# Patient Record
Sex: Female | Born: 1960 | Race: White | Hispanic: No | Marital: Married | Smoking: Never smoker
Health system: Southern US, Community
[De-identification: ages and names within clinical notes are randomized; demographics above are authoritative.]

## PROBLEM LIST (undated history)

## (undated) DIAGNOSIS — Z8489 Family history of other specified conditions: Secondary | ICD-10-CM

## (undated) DIAGNOSIS — F32A Depression, unspecified: Secondary | ICD-10-CM

## (undated) DIAGNOSIS — I499 Cardiac arrhythmia, unspecified: Secondary | ICD-10-CM

## (undated) DIAGNOSIS — R0600 Dyspnea, unspecified: Secondary | ICD-10-CM

## (undated) DIAGNOSIS — Z860101 Personal history of adenomatous and serrated colon polyps: Secondary | ICD-10-CM

## (undated) DIAGNOSIS — Z86010 Personal history of colonic polyps: Secondary | ICD-10-CM

## (undated) DIAGNOSIS — I10 Essential (primary) hypertension: Secondary | ICD-10-CM

## (undated) DIAGNOSIS — I493 Ventricular premature depolarization: Secondary | ICD-10-CM

## (undated) DIAGNOSIS — I4891 Unspecified atrial fibrillation: Secondary | ICD-10-CM

## (undated) DIAGNOSIS — N393 Stress incontinence (female) (male): Secondary | ICD-10-CM

## (undated) DIAGNOSIS — G4730 Sleep apnea, unspecified: Secondary | ICD-10-CM

## (undated) DIAGNOSIS — G43909 Migraine, unspecified, not intractable, without status migrainosus: Secondary | ICD-10-CM

## (undated) DIAGNOSIS — D649 Anemia, unspecified: Secondary | ICD-10-CM

## (undated) DIAGNOSIS — F411 Generalized anxiety disorder: Secondary | ICD-10-CM

## (undated) DIAGNOSIS — F329 Major depressive disorder, single episode, unspecified: Secondary | ICD-10-CM

## (undated) DIAGNOSIS — F419 Anxiety disorder, unspecified: Secondary | ICD-10-CM

## (undated) DIAGNOSIS — M5030 Other cervical disc degeneration, unspecified cervical region: Secondary | ICD-10-CM

## (undated) HISTORY — DX: Anxiety disorder, unspecified: F41.9

## (undated) HISTORY — DX: Ventricular premature depolarization: I49.3

## (undated) HISTORY — PX: POLYPECTOMY: SHX149

## (undated) HISTORY — DX: Migraine, unspecified, not intractable, without status migrainosus: G43.909

## (undated) HISTORY — DX: Other cervical disc degeneration, unspecified cervical region: M50.30

## (undated) HISTORY — DX: Depression, unspecified: F32.A

## (undated) HISTORY — DX: Major depressive disorder, single episode, unspecified: F32.9

## (undated) HISTORY — PX: COLONOSCOPY: SHX174

## (undated) HISTORY — DX: Essential (primary) hypertension: I10

## (undated) HISTORY — DX: Unspecified atrial fibrillation: I48.91

---
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
## 1984-07-28 HISTORY — PX: NASAL RECONSTRUCTION: SHX2069

## 1984-07-28 HISTORY — PX: NASAL SEPTUM SURGERY: SHX37

## 1998-07-17 ENCOUNTER — Other Ambulatory Visit: Admission: RE | Admit: 1998-07-17 | Discharge: 1998-07-17 | Payer: Self-pay | Admitting: Obstetrics and Gynecology

## 1999-07-24 ENCOUNTER — Other Ambulatory Visit: Admission: RE | Admit: 1999-07-24 | Discharge: 1999-07-24 | Payer: Self-pay | Admitting: Obstetrics and Gynecology

## 2000-07-24 ENCOUNTER — Other Ambulatory Visit: Admission: RE | Admit: 2000-07-24 | Discharge: 2000-07-24 | Payer: Self-pay | Admitting: Obstetrics and Gynecology

## 2001-07-26 ENCOUNTER — Other Ambulatory Visit: Admission: RE | Admit: 2001-07-26 | Discharge: 2001-07-26 | Payer: Self-pay | Admitting: Obstetrics and Gynecology

## 2002-07-26 ENCOUNTER — Other Ambulatory Visit: Admission: RE | Admit: 2002-07-26 | Discharge: 2002-07-26 | Payer: Self-pay | Admitting: Obstetrics and Gynecology

## 2003-08-03 ENCOUNTER — Other Ambulatory Visit: Admission: RE | Admit: 2003-08-03 | Discharge: 2003-08-03 | Payer: Self-pay | Admitting: Obstetrics and Gynecology

## 2004-08-08 ENCOUNTER — Other Ambulatory Visit: Admission: RE | Admit: 2004-08-08 | Discharge: 2004-08-08 | Payer: Self-pay | Admitting: Obstetrics and Gynecology

## 2005-08-12 ENCOUNTER — Other Ambulatory Visit: Admission: RE | Admit: 2005-08-12 | Discharge: 2005-08-12 | Payer: Self-pay | Admitting: Obstetrics and Gynecology

## 2006-08-17 ENCOUNTER — Other Ambulatory Visit: Admission: RE | Admit: 2006-08-17 | Discharge: 2006-08-17 | Payer: Self-pay | Admitting: Obstetrics and Gynecology

## 2010-04-20 ENCOUNTER — Emergency Department (HOSPITAL_BASED_OUTPATIENT_CLINIC_OR_DEPARTMENT_OTHER)
Admission: EM | Admit: 2010-04-20 | Discharge: 2010-04-20 | Payer: Self-pay | Source: Home / Self Care | Admitting: Emergency Medicine

## 2010-04-20 ENCOUNTER — Ambulatory Visit: Payer: Self-pay | Admitting: Diagnostic Radiology

## 2014-09-25 ENCOUNTER — Ambulatory Visit: Payer: Self-pay | Admitting: Physician Assistant

## 2014-10-12 ENCOUNTER — Encounter: Payer: Self-pay | Admitting: Physician Assistant

## 2014-10-12 ENCOUNTER — Ambulatory Visit (INDEPENDENT_AMBULATORY_CARE_PROVIDER_SITE_OTHER): Payer: 59 | Admitting: Physician Assistant

## 2014-10-12 VITALS — BP 140/88 | HR 64 | Temp 97.7°F | Resp 16 | Ht 67.5 in | Wt 176.0 lb

## 2014-10-12 DIAGNOSIS — I10 Essential (primary) hypertension: Secondary | ICD-10-CM

## 2014-10-12 DIAGNOSIS — M2660 Temporomandibular joint disorder, unspecified: Secondary | ICD-10-CM

## 2014-10-12 DIAGNOSIS — F419 Anxiety disorder, unspecified: Secondary | ICD-10-CM

## 2014-10-12 DIAGNOSIS — R197 Diarrhea, unspecified: Secondary | ICD-10-CM

## 2014-10-12 DIAGNOSIS — R6889 Other general symptoms and signs: Secondary | ICD-10-CM

## 2014-10-12 DIAGNOSIS — R590 Localized enlarged lymph nodes: Secondary | ICD-10-CM

## 2014-10-12 DIAGNOSIS — M542 Cervicalgia: Secondary | ICD-10-CM

## 2014-10-12 DIAGNOSIS — F32A Depression, unspecified: Secondary | ICD-10-CM

## 2014-10-12 DIAGNOSIS — F329 Major depressive disorder, single episode, unspecified: Secondary | ICD-10-CM

## 2014-10-12 DIAGNOSIS — Z0001 Encounter for general adult medical examination with abnormal findings: Secondary | ICD-10-CM

## 2014-10-12 DIAGNOSIS — R002 Palpitations: Secondary | ICD-10-CM

## 2014-10-12 DIAGNOSIS — M26609 Unspecified temporomandibular joint disorder, unspecified side: Secondary | ICD-10-CM

## 2014-10-12 DIAGNOSIS — R0602 Shortness of breath: Secondary | ICD-10-CM

## 2014-10-12 DIAGNOSIS — R5383 Other fatigue: Secondary | ICD-10-CM

## 2014-10-12 LAB — LIPID PANEL
CHOLESTEROL: 189 mg/dL (ref 0–200)
HDL: 55 mg/dL (ref >=46)
LDL CALC: 105 mg/dL — AB (ref 0–99)
TRIGLYCERIDES: 145 mg/dL (ref <150)
Total CHOL/HDL Ratio: 3.4 Ratio
VLDL: 29 mg/dL (ref 0–40)

## 2014-10-12 LAB — BASIC METABOLIC PANEL WITH GFR
BUN: 11 mg/dL (ref 6–23)
CALCIUM: 9.5 mg/dL (ref 8.4–10.5)
CHLORIDE: 102 meq/L (ref 96–112)
CO2: 26 meq/L (ref 19–32)
CREATININE: 0.66 mg/dL (ref 0.50–1.10)
GFR, Est African American: >89 mL/min
Glucose, Bld: 78 mg/dL (ref 70–99)
Potassium: 4.5 mEq/L (ref 3.5–5.3)
SODIUM: 136 meq/L (ref 135–145)

## 2014-10-12 LAB — IRON AND TIBC
%SAT: 19 % — ABNORMAL LOW (ref 20–55)
IRON: 81 ug/dL (ref 42–145)
TIBC: 418 ug/dL (ref 250–470)
UIBC: 337 ug/dL (ref 125–400)

## 2014-10-12 LAB — HEPATIC FUNCTION PANEL
ALBUMIN: 4.2 g/dL (ref 3.5–5.2)
ALK PHOS: 69 U/L (ref 39–117)
ALT: 21 U/L (ref 0–35)
AST: 20 U/L (ref 0–37)
Bilirubin, Direct: 0.1 mg/dL (ref 0.0–0.3)
Indirect Bilirubin: 0.4 mg/dL (ref 0.2–1.2)
Total Bilirubin: 0.5 mg/dL (ref 0.2–1.2)
Total Protein: 7.2 g/dL (ref 6.0–8.3)

## 2014-10-12 LAB — MAGNESIUM: MAGNESIUM: 1.9 mg/dL (ref 1.5–2.5)

## 2014-10-12 MED ORDER — VERAPAMIL HCL ER 120 MG PO TBCR: 120.0000 mg | EXTENDED_RELEASE_TABLET | Freq: Every day | ORAL | Status: DC

## 2014-10-12 NOTE — Patient Instructions (Addendum)
Benefiber is good for constipation/diarrhea/irritable bowel syndrome, it helps with weight loss and can help lower your bad cholesterol. Please do 1-2 TBSP in the morning in water, coffee, or tea. It can take up to a month before you can see a difference with your bowel movements. It is cheapest from [employer], [employer], [employer].   Please start on the nexium samples once daily for 10 days 30 mins before food.  Silent reflux/GERD can cause a cough/shortness of breath/hoarsness/palpitations  WITHOUT heart burn because the esophagus that goes to the stomach and trachea that goes to the lungs are very close and when you lay down the acid can irritate your throat and lungs. This can cause hoarseness, cough, and wheezing. Please stop any alcohol or anti-inflammatories like aleve/advil/ibuprofen and start nexium samples 30 mins before food for 2 weeks, then switch to over the counter zantac/ratinidine or pepcid/famotadine once at night for 2 weeks.   Can start the verapamil in 1 week at night  Get a chest xray and neck xray at [facility]  We are going to get an [place] of your neck to look at your lymph nodes and neck.   What is the TMJ? The temporomandibular (tem-PUH-ro-man-DIB-yoo-ler) joint, or the TMJ, connects the upper and lower jawbones. This joint allows the jaw to open wide and move back and forth when you chew, talk, or yawn.There are also several muscles that help this joint move. There can be muscle tightness and pain in the muscle that can cause several symptoms.  What causes TMJ pain? There are many causes of TMJ pain. Repeated chewing (for example, chewing gum) and clenching your teeth can cause pain in the joint. Some TMJ pain has no obvious cause. What can I do to ease the pain? There are many things you can do to help your pain get better. When you have pain:  Eat soft foods and stay away from chewy foods (for example, taffy) Try to use both sides of your mouth to chew Don't chew  gum Massage Don't open your mouth wide (for example, during yawning or singing) Don't bite your cheeks or fingernails Lower your amount of stress and worry Applying a warm, damp washcloth to the joint may help. Over-the-counter pain medicines such as ibuprofen (one brand: Advil) or acetaminophen (one brand: Tylenol) might also help. Do not use these medicines if you are allergic to them or if your doctor told you not to use them. How can I stop the pain from coming back? When your pain is better, you can do these exercises to make your muscles stronger and to keep the pain from coming back:  Resisted mouth opening: Place your thumb or two fingers under your chin and open your mouth slowly, pushing up lightly on your chin with your thumb. Hold for three to six seconds. Close your mouth slowly. Resisted mouth closing: Place your thumbs under your chin and your two index fingers on the ridge between your mouth and the bottom of your chin. Push down lightly on your chin as you close your mouth. Tongue up: Slowly open and close your mouth while keeping the tongue touching the roof of the mouth. Side-to-side jaw movement: Place an object about one fourth of an inch thick (for example, two tongue depressors) between your front teeth. Slowly move your jaw from side to side. Increase the thickness of the object as the exercise becomes easier Forward jaw movement: Place an object about one fourth of an inch thick between your  front teeth and move the bottom jaw forward so that the bottom teeth are in front of the top teeth. Increase the thickness of the object as the exercise becomes easier. These exercises should not be painful. If it hurts to do these exercises, stop doing them and talk to your family doctor.   Hiatal Hernia A hiatal hernia occurs when part of your stomach slides above the muscle that separates your abdomen from your chest (diaphragm). You can be born with a hiatal hernia (congenital), or  it may develop over time. In almost all cases of hiatal hernia, only the top part of the stomach pushes through.  Many people have a hiatal hernia with no symptoms. The larger the hernia, the more likely that you will have symptoms. In some cases, a hiatal hernia allows stomach acid to flow back into the tube that carries food from your mouth to your stomach (esophagus). This may cause heartburn symptoms. Severe heartburn symptoms may mean you have developed a condition called gastroesophageal reflux disease (GERD).  CAUSES  Hiatal hernias are caused by a weakness in the opening (hiatus) where your esophagus passes through your diaphragm to attach to the upper part of your stomach. You may be born with a weakness in your hiatus, or a weakness can develop. RISK FACTORS Older age is a major risk factor for a hiatal hernia. Anything that increases pressure on your diaphragm can also increase your risk of a hiatal hernia. This includes:  Pregnancy.  Excess weight.  Frequent constipation. SIGNS AND SYMPTOMS  People with a hiatal hernia often have no symptoms. If symptoms develop, they are almost always caused by GERD. They may include:  Heartburn.  Belching.  Indigestion.  Trouble swallowing.  Coughing or wheezing.  Shortness of breath  Palpitations  Sore throat.  Hoarseness.  Chest pain. DIAGNOSIS  A hiatal hernia is sometimes found during an exam for another problem. Your health care provider may suspect a hiatal hernia if you have symptoms of GERD. Tests may be done to diagnose GERD. These may include:  X-rays of your stomach or chest.  An upper gastrointestinal (GI) series. This is an X-ray exam of your GI tract involving the use of a chalky liquid that you swallow. The liquid shows up clearly on the X-ray.  Endoscopy. This is a procedure to look into your stomach using a thin, flexible tube that has a tiny camera and light on the end of it. TREATMENT  If you have no  symptoms, you may not need treatment. If you have symptoms, treatment may include:  Dietary and lifestyle changes to help reduce GERD symptoms.  Medicines. These may include:  Over-the-counter antacids.  Medicines that make your stomach empty more quickly.  Medicines that block the production of stomach acid (H2 blockers).  Stronger medicines to reduce stomach acid (proton pump inhibitors).  You may need surgery to repair the hernia if other treatments are not helping. HOME CARE INSTRUCTIONS   Take all medicines as directed by your health care provider.  Quit smoking, if you smoke.  Try to achieve and maintain a healthy body weight.  Eat frequent small meals instead of three large meals a day. This keeps your stomach from getting too full.  Eat slowly.  Do not lie down right after eating.  Do noteat 1-2 hours before bed.   Do not drink beverages with caffeine. These include cola, coffee, cocoa, and tea.  Do not drink alcohol.  Avoid foods that can make symptoms  of GERD worse. These may include:  Fatty foods.  Citrus fruits.  Other foods and drinks that contain acid.  Avoid putting pressure on your belly. Anything that puts pressure on your belly increases the amount of acid that may be pushed up into your esophagus.   Avoid bending over, especially after eating.  Raise the head of your bed by putting blocks under the legs. This keeps your head and esophagus higher than your stomach.  Do not wear tight clothing around your chest or stomach.  Try not to strain when having a bowel movement, when urinating, or when lifting heavy objects. SEEK MEDICAL CARE IF:  Your symptoms are not controlled with medicines or lifestyle changes.  You are having trouble swallowing.  You have coughing or wheezing that will not go away. SEEK IMMEDIATE MEDICAL CARE IF:  Your pain is getting worse.  Your pain spreads to your arms, neck, jaw, teeth, or back.  You have  shortness of breath.  You sweat for no reason.  You feel sick to your stomach (nauseous) or vomit.  You vomit blood.  You have bright red blood in your stools.  You have black, tarry stools.  Document Released: 10/04/2003 Document Revised: 11/28/2013 Document Reviewed: 07/01/2013 [facility] Patient Information 2015 [place], [place]. This information is not intended to replace advice given to you by your health care provider. Make sure you discuss any questions you have with your health care provider.

## 2014-10-12 NOTE — Progress Notes (Signed)
Complete Physical  Assessment and Plan: TMJ-information given to the patient, no gum/decrease hard foods, warm wet wash clothes, decrease stress, talk with dentist about possible night guard, can do massage, and exercise.  Neck non tender lymphadenopathy with thyroid U/S- get CBC, labs, US neck Neck pain- get Xray, can refer to PT, exercises, RICE.  Diarrhea- benefiber, diet, will set up for colonoscopy.  Palpitations- check labs, r/u anemia, TSH, dehydration, electrolytes imbalance Check EKG which is normal, PPI for 2 weeks, refer to cardiology Increase water, decrease alcohol/caffeine, valsalva maneuvers taught Follow up in 4 weeks, if not better can take verapamil 120 QHS SOB nonexertional, atypical- ? GERD- get on nexium, get CXR, get labs.  Hypertension- monitor at home, check labs, can get on verapmail 120mg  QHS Depression/anxiety- cont meds.   OVER 40 minutes of exam, counseling, chart review, referral performed Follow up 1 month.  Discussed med's effects and SE's. Screening labs and tests as requested with regular follow-up as recommended.  HPI  54 y.o. female  presents to establish as a new patient, her parents and husband are patients here.   Her blood pressure has been controlled at home, today their BP is BP: 140/88 mmHg She does workout. She denies chest pain, shortness of breath, dizziness.  Has mainly been seeing Dr. Philis Pique ( OB/GYN) and UC.  She states she has had her heart fluttering/skipping a beat, worse with caffeine or with fatigue, will get it once a week, last for seconds, no accompaniments, denies SOB, dizziness, CP. Will workout at the gym on the treadmill, would occ have fast heart beats with exertion but no symptoms with it, coughing would help. Does have sister with MVP.  She states that she has always had trouble getting a deep breath, she will have to yawn to catch her breath, when she was exercising regularly she felt better, denies cough, wheezing, CP, non  smoking.  She has depression/anxiety and is on zoloft which she states helps.  She has neck pain, starts C7, up to her shoulders are very tight, she will hear popping and clicking, and then last week felt a catch while looking up. Denies numbness, tingling, weakness in her arms.    Current Medications:       norethindrone-ethinyl estradiol 1-20 MG-MCG tablet  Commonly known as:  JUNEL FE,GILDESS FE,LOESTRIN FE  Take 1 tablet by mouth daily.     sertraline 50 MG tablet  Commonly known as:  ZOLOFT  Take 50 mg by mouth daily.       Health Maintenance:   Tetanus: 2011 Pneumovax: Flu vaccine: Zostavax: LMP: 2 weeks ago Pap: yearly with Dr. Philis Pique MGM: yearly   DEXA: N/A Colonoscopy: is due EGD: N/A Last Dental Exam: Dr. Trenton Gammon Last Eye Exam: recent exam, wears glasses, [facility]  Allergies: Allergies no known allergies Medical History:  Past Medical History  Diagnosis Date  . Hypertension   . Depression   . Anxiety   . Migraine     history, has not had in years   Surgical History:  Past Surgical History  Procedure Laterality Date  . Nasal reconstruction  1986    Septal repair   Family History:  Family History  Problem Relation Age of Onset  . Cancer Maternal Grandmother     cervical cancer  . Arthritis Mother     bilateral knees  . Arthritis Maternal Aunt     hands  . Osteoporosis Maternal Grandmother   . Osteoporosis Paternal Grandmother   .  Heart attack Maternal Grandmother     age 69  . Heart failure Maternal Grandmother     age 22   Social History:  History  Substance Use Topics  . Smoking status: Never Smoker   . Smokeless tobacco: Never Used  . Alcohol Use: 0.0 oz/week    0 Standard drinks or equivalent per week     Comment: wine every night    Review of Systems: Review of Systems  Constitutional: Positive for malaise/fatigue. Negative for fever, chills, weight loss and diaphoresis.  HENT: Negative for congestion, ear discharge,  ear pain, hearing loss, nosebleeds, sore throat and tinnitus.   Eyes: Negative.   Respiratory: Positive for shortness of breath. Negative for cough, hemoptysis, sputum production, wheezing and stridor.   Cardiovascular: Positive for palpitations. Negative for chest pain, orthopnea, claudication, leg swelling and PND.  Gastrointestinal: Negative.   Genitourinary: Negative.   Musculoskeletal: Positive for neck pain. Negative for myalgias, back pain, joint pain and falls.  Skin: Negative.   Neurological: Positive for headaches. Negative for dizziness, tingling, tremors, sensory change, speech change, focal weakness, seizures, loss of consciousness and weakness.  Psychiatric/Behavioral: Positive for depression. Negative for suicidal ideas, hallucinations, memory loss and substance abuse. The patient is nervous/anxious. The patient does not have insomnia.     Physical Exam: Estimated body mass index is 27.14 kg/(m^2) as calculated from the following:   Height as of this encounter: 5' 7.5" (1.715 m).   Weight as of this encounter: 176 lb (79.833 kg). BP 140/88 mmHg  Pulse 64  Temp(Src) 97.7 F (36.5 C)  Resp 16  Ht 5' 7.5" (1.715 m)  Wt 176 lb (79.833 kg)  BMI 27.14 kg/m2  LMP 09/22/2014 (Approximate) General Appearance: Well nourished, in no apparent distress.  Eyes: PERRLA, EOMs, conjunctiva no swelling or erythema, normal fundi and vessels.  Sinuses: No Frontal/maxillary tenderness  ENT/Mouth: Ext aud canals clear, normal light reflex with TMs without erythema, bulging. Good dentition. No erythema, swelling, or exudate on post pharynx. Tonsils not swollen or erythematous. Hearing normal. + TMJ bilateral Neck: Supple, thyroid nodules. No bruits  Respiratory: Respiratory effort normal, BS equal bilaterally without rales, rhonchi, wheezing or stridor.  Cardio: RRR without murmurs, rubs or gallops. Brisk peripheral pulses without edema.  Chest: symmetric, with normal excursions and  percussion.  Breasts: defer  Abdomen: Soft, + epigastric tenderness, no guarding, rebound, hernias, masses, or organomegaly. .  Lymphatics: Non tender bilateral submandibular lymphadenopathy.  Genitourinary: defer Musculoskeletal: Full ROM all peripheral extremities,5/5 strength, and normal gait. normal range of motion and supple, without spinous process tenderness, with paraspinal muscle tenderness the right side, normal sensation, reflexes, and pulses distal. Skin: Warm, dry without rashes, lesions, ecchymosis. Neuro: Cranial nerves intact, reflexes equal bilaterally. Normal muscle tone, no cerebellar symptoms. Sensation intact.  Psych: Awake and oriented X 3, normal affect, Insight and Judgment appropriate.   EKG: WNL no changes.   Vicie Mutters 4:09 PM [facility]

## 2014-10-13 ENCOUNTER — Ambulatory Visit (HOSPITAL_COMMUNITY)
Admission: RE | Admit: 2014-10-13 | Discharge: 2014-10-13 | Disposition: A | Discharge status: Home / Self Care | Payer: 59 | Source: Ambulatory Visit | Attending: Physician Assistant | Admitting: Physician Assistant

## 2014-10-13 DIAGNOSIS — M25511 Pain in right shoulder: Secondary | ICD-10-CM | POA: Insufficient documentation

## 2014-10-13 DIAGNOSIS — R0602 Shortness of breath: Secondary | ICD-10-CM | POA: Diagnosis not present

## 2014-10-13 DIAGNOSIS — M5032 Other cervical disc degeneration, mid-cervical region: Secondary | ICD-10-CM | POA: Insufficient documentation

## 2014-10-13 DIAGNOSIS — M542 Cervicalgia: Secondary | ICD-10-CM | POA: Diagnosis not present

## 2014-10-13 DIAGNOSIS — G8929 Other chronic pain: Secondary | ICD-10-CM | POA: Diagnosis not present

## 2014-10-13 DIAGNOSIS — M25512 Pain in left shoulder: Secondary | ICD-10-CM | POA: Diagnosis not present

## 2014-10-13 LAB — CBC WITH DIFFERENTIAL/PLATELET
BASOS PCT: 0 % (ref 0–1)
Basophils Absolute: 0 10*3/uL (ref 0.0–0.1)
Eosinophils Absolute: 0.2 10*3/uL (ref 0.0–0.7)
Eosinophils Relative: 2 % (ref 0–5)
HEMATOCRIT: 38.9 % (ref 36.0–46.0)
Hemoglobin: 12.9 g/dL (ref 12.0–15.0)
Lymphocytes Relative: 26 % (ref 12–46)
Lymphs Abs: 2.3 10*3/uL (ref 0.7–4.0)
MCH: 29.4 pg (ref 26.0–34.0)
MCHC: 33.2 g/dL (ref 30.0–36.0)
MCV: 88.6 fL (ref 78.0–100.0)
MONO ABS: 0.8 10*3/uL (ref 0.1–1.0)
MONOS PCT: 9 % (ref 3–12)
MPV: 8.9 fL (ref 8.6–12.4)
Neutro Abs: 5.6 10*3/uL (ref 1.7–7.7)
Neutrophils Relative %: 63 % (ref 43–77)
PLATELETS: 311 10*3/uL (ref 150–400)
RBC: 4.39 MIL/uL (ref 3.87–5.11)
RDW: 12.8 % (ref 11.5–15.5)
WBC: 8.9 10*3/uL (ref 4.0–10.5)

## 2014-10-13 LAB — VITAMIN B12: VITAMIN B 12: 412 pg/mL (ref 211–911)

## 2014-10-13 LAB — URINALYSIS, MICROSCOPIC ONLY
Casts: NONE SEEN
Crystals: NONE SEEN
SQUAMOUS EPITHELIAL / LPF: NONE SEEN

## 2014-10-13 LAB — MICROALBUMIN / CREATININE URINE RATIO: Creatinine, Urine: 36.4 mg/dL

## 2014-10-13 LAB — VITAMIN D 25 HYDROXY (VIT D DEFICIENCY, FRACTURES): VIT D 25 HYDROXY: 74 ng/mL (ref 30–100)

## 2014-10-13 LAB — TSH: TSH: 1.908 u[IU]/mL (ref 0.350–4.500)

## 2014-10-13 LAB — FERRITIN: FERRITIN: 54 ng/mL (ref 10–291)

## 2014-10-13 LAB — HEMOGLOBIN A1C
HEMOGLOBIN A1C: 5.5 % (ref <5.7)
MEAN PLASMA GLUCOSE: 111 mg/dL (ref <117)

## 2014-10-17 ENCOUNTER — Other Ambulatory Visit: Payer: Self-pay

## 2014-10-17 ENCOUNTER — Encounter: Payer: Self-pay | Admitting: Internal Medicine

## 2014-10-17 DIAGNOSIS — R002 Palpitations: Secondary | ICD-10-CM

## 2014-10-19 ENCOUNTER — Ambulatory Visit (HOSPITAL_COMMUNITY)
Admission: RE | Admit: 2014-10-19 | Discharge: 2014-10-19 | Disposition: A | Discharge status: Home / Self Care | Payer: 59 | Source: Ambulatory Visit | Attending: Physician Assistant | Admitting: Physician Assistant

## 2014-10-19 DIAGNOSIS — E041 Nontoxic single thyroid nodule: Secondary | ICD-10-CM | POA: Diagnosis not present

## 2014-10-19 DIAGNOSIS — R590 Localized enlarged lymph nodes: Secondary | ICD-10-CM

## 2014-10-23 ENCOUNTER — Other Ambulatory Visit: Payer: Self-pay | Admitting: Physician Assistant

## 2014-10-23 DIAGNOSIS — R590 Localized enlarged lymph nodes: Secondary | ICD-10-CM

## 2014-10-24 ENCOUNTER — Encounter: Payer: Self-pay | Admitting: Internal Medicine

## 2014-10-26 ENCOUNTER — Ambulatory Visit
Admission: RE | Admit: 2014-10-26 | Discharge: 2014-10-26 | Disposition: A | Discharge status: Home / Self Care | Payer: 59 | Source: Ambulatory Visit | Attending: Physician Assistant | Admitting: Physician Assistant

## 2014-10-26 DIAGNOSIS — R590 Localized enlarged lymph nodes: Secondary | ICD-10-CM

## 2014-10-26 MED ORDER — IOPAMIDOL (ISOVUE-300) INJECTION 61%
75.0000 mL | Freq: Once | INTRAVENOUS | Status: AC | PRN
  Administered 2014-10-26: 75 mL via INTRAVENOUS

## 2014-11-02 ENCOUNTER — Encounter: Payer: Self-pay | Admitting: Cardiology

## 2014-12-07 ENCOUNTER — Encounter: Payer: Self-pay | Admitting: Cardiology

## 2014-12-07 NOTE — Progress Notes (Signed)
Patient ID: KEITH CANCIO, female   DOB: [birth year], 54 y.o.   MRN: [id number]   Ernst Spell    Date of visit:  12/07/2014 DOB:  1960-12-03    Age:  54 yrs. Medical record number:  [id number]     Account number:  [id number] Primary Care Provider: Unk Pinto D ____________________________ CURRENT DIAGNOSES  1. Palpitations  2. Essential hypertension  3. Overweight  4. Ventricular premature depolarization ____________________________ ALLERGIES  No Known Allergies ____________________________ MEDICATIONS  1. sertraline 50 mg tablet, 1 p.o. daily  2. Junel 1/20 (21) 1 mg-20 mcg tablet, 1 p.o. daily  3. verapamil ER 120 mg 24 hr capsule,extended release, QHS  4. Zantac 75 mg tablet, QHS ____________________________ CHIEF COMPLAINTS  Followup of Palpitations ____________________________ HISTORY OF PRESENT ILLNESS Patient returns for cardiac followup. She has been wearing an event monitor but is said to get a replacement for her because she forgot to discharge it. She denies angina but continues to have occasional palpitations. Her event monitor has shown some transmissions that showed no arrhythmias during complaints of skipped heartbeats and others that showed PVCs. The echocardiogram showed concentric LVH with normal LV systolic function. 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 ____________________________ PAST HISTORY  Past Medical Illnesses:  hypertension, anxiety, migraine headaches;  Cardiovascular Illnesses:  arrhythmia-PVCs;  Surgical Procedures:  nasal reconstruction;  NYHA Classification:  I;  Canadian Angina Classification:  Class 0: Asymptomatic;  Cardiology Procedures-Invasive:  no history of prior cardiac procedures;  Cardiology Procedures-Noninvasive:  event monitor April 2016;  LVEF of 60% documented via echocardiogram on 11/24/2014,   ____________________________ CARDIO-PULMONARY TEST DATES EKG Date:  11/02/2014;  Holter/Event  Monitor Date: 11/03/2014;  Chest Xray Date: 10/13/2014;   ____________________________ FAMILY HISTORY Brother -- Brother alive with problem, Hypertension Brother -- Brother alive and well Brother -- Brother alive and well Father -- Father alive with problem, Hypercholesterolemia Mother -- Mother alive with problem, Hypercholesterolemia Sister -- Sister alive and well Sister -- Sister alive with problem Sister -- Sister alive and well ____________________________ SOCIAL HISTORY Alcohol Use:  wine 1 per day;  Smoking:  never smoked;  Diet:  regular diet;  Lifestyle:  married and 1 son;  Exercise:  treadmill for approximately 45 minutes 4 days per week and weight lifting for approximately 45 minutes 4 days per week;  Occupation:  Optometrist;  Residence:  lives with husband;   ____________________________ REVIEW OF SYSTEMS General:  denies recent weight change, fatique or change in exercise tolerance. Respiratory: dyspnea with exertion Cardiovascular:  please review HPI Abdominal: diarrhea Musculoskeletal:  cervical spondylosis Neurological:  occasional migraine headaches Psychiatric:  anxiety, depression  ____________________________ PHYSICAL EXAMINATION VITAL SIGNS  Blood Pressure:  135/80 Sitting, Right arm, regular cuff   Pulse:  72/min. Weight:  176.00 lbs. Height:  67.5"BMI: 27  Constitutional:  pleasant white female, in no acute distress, mildly obese Skin:  warm and dry to touch, no apparent skin lesions, or masses noted. Head:  normocephalic, normal hair pattern, no masses or tenderness Chest:  normal symmetry, clear to auscultation. Cardiac:  regular rhythm, normal S1 and S2, No S3 or S4, no murmurs, gallops or rubs detected. Peripheral Pulses:  the femoral,dorsalis pedis, and posterior tibial pulses are full and equal bilaterally with no bruits auscultated. Extremities & Back:  no deformities, clubbing, cyanosis, erythema or edema observed. Normal muscle strength and  tone. Neurological:  no gross motor or sensory deficits noted, affect appropriate, oriented x3. ____________________________ MOST RECENT LIPID  PANEL 10/12/14  CHOL TOTL 189 mg/dl, LDL 105 NM, HDL 55 mg/dl, TRIGLYCER 145 mg/dl, ALT 21 u/l, ALK PHOS 69 u/l, CHOL/HDL 3.4 (Calc) and AST 20 u/l ____________________________ IMPRESSIONS/PLAN  1. Episodic symptomatic palpitations that correlate infrequently with PVCs on monitoring no episodes of SVT are noted 2. Hypertensive heart disease with LVH 3. Overweight  Recommendations:  Recommended that she continue to lose weight and had good blood pressure control. The monitor has really shown only rare PVCs and also some complaints of palpitations that did not correlate with PVCs. At this point I don't think she needs additional workup. She needs to lose weight and have good blood pressure control because of the LVH. Recommended to continue exercise. Followup as needed. ____________________________ TODAYS ORDERS  1. Return: prn                       ____________________________ Cardiology Physician:  Kerry Hough MD [facility]

## 2014-12-07 NOTE — Progress Notes (Signed)
Patient ID: Samantha Terrell, female   DOB: 1961-02-18, 54 y.o.   MRN: [id number]  Ernst Spell    Date of visit:  11/02/2014 DOB:  04-Mar-1961    Age:  54 yrs. Medical record number:  [id number]     Account number:  [id number] Primary Care Provider: Unk Pinto D ____________________________ CURRENT DIAGNOSES  1. Palpitations  2. Dyspnea  3. Essential hypertension  4. Overweight ____________________________ ALLERGIES  No Known Allergies ____________________________ MEDICATIONS  1. sertraline 50 mg tablet, 1 p.o. daily  2. Junel 1/20 (21) 1 mg-20 mcg tablet, 1 p.o. daily  3. verapamil ER 120 mg 24 hr capsule,extended release, QHS  4. Zantac 75 mg tablet, QHS ____________________________ CHIEF COMPLAINTS  Dyspnea with exertion  Palpitations ____________________________ HISTORY OF PRESENT ILLNESS This very nice 54 year old female is seen for evaluation of palpitations and mild dyspnea. The patient was recently seen for a physical and has a several year history of dyspnea as well as palpitations. She describes the palpitations as a skip in her heart that happens intermittently for the past 6 years. She also describes episodes of rapid heartbeat they believe her feel as if she is short of breath. She has also noted some dyspnea although she is able to do treadmill exercise for 40 minutes without limiting dyspnea. She does note dyspnea when carrying boxes up stairs or with other activity. The rapid heartbeat happens a few times per week. She was recently seen for a physical and noted to be somewhat hypertensive and was started on the rapid note. She has continued to have palpitations since been placed on verapamil. She has no chest pain suggestive of angina and denies PND, orthopnea or significant edema. She has slowly gained some weight over the past few years and is in the overweight category. She has no history of congenital heart disease. There is no family history of premature cardiac  disease. On her recent evaluation she had a normal chest x-ray and because of some possible adenopathy eventually had a CT scan of her neck that showed no carotid artery disease or calcification and normal adenopathy. TSH was normal previously. She uses only an occasional amount of caffeine. She does tend to be an anxious person and has been placed on sertraline for anxiety and some possible depression in the past. ____________________________ PAST HISTORY  Past Medical Illnesses:  hypertension, anxiety, migraine headaches;  Cardiovascular Illnesses:  no previous history of cardiac disease.;  Surgical Procedures:  nasal reconstruction;  NYHA Classification:  I;  Canadian Angina Classification:  Class 0: Asymptomatic;  Cardiology Procedures-Invasive:  no history of prior cardiac procedures;  Cardiology Procedures-Noninvasive:  no previous non-invasive procedures;  LVEF not documented,   ____________________________ CARDIO-PULMONARY TEST DATES EKG Date:  11/02/2014;  Chest Xray Date: 10/13/2014;   ____________________________ FAMILY HISTORY Brother -- Brother alive with problem, Hypertension Brother -- Brother alive and well Brother -- Brother alive and well Father -- Father alive with problem, Hypercholesterolemia Mother -- Mother alive with problem, Hypercholesterolemia Sister -- Sister alive and well Sister -- Sister alive with problem Sister -- Sister alive and well ____________________________ SOCIAL HISTORY Alcohol Use:  wine 1 per day;  Smoking:  never smoked;  Diet:  regular diet;  Lifestyle:  married and 1 son;  Exercise:  treadmill for approximately 45 minutes 4 days per week and weight lifting for approximately 45 minutes 4 days per week;  Occupation:  Optometrist;  Residence:  lives with husband;   ____________________________ REVIEW OF SYSTEMS General:  denies recent  weight change, fatique or change in exercise tolerance.  Integumentary:dry skin Eyes: denies diplopia, history of  glaucoma or visual problems. Ears, Nose, Throat, Mouth:  denies any hearing loss, epistaxis, hoarseness or difficulty speaking. Respiratory: dyspnea with exertion Cardiovascular:  please review HPI Abdominal: diarrheaGenitourinary-Female: no dysuria, urgency, frequency, UTIs, or stress incontinence Musculoskeletal:  cervical spondylosis Neurological:  occasional migraine headaches Psychiatric:  anxiety, depression Hematological/Immunologic:  denies any food allergies, bleeding disorders. ____________________________ PHYSICAL EXAMINATION VITAL SIGNS  Blood Pressure:  146/80 Sitting, Left arm, regular cuff  , 142/78 Standing, Left arm and regular cuff   Pulse:  88/min. Weight:  178.00 lbs. Height:  67.5"BMI: 27  Constitutional:  pleasant white female, in no acute distress, mildly obese Skin:  warm and dry to touch, no apparent skin lesions, or masses noted. Head:  normocephalic, normal hair pattern, no masses or tenderness Eyes:  EOMS Intact, PERRLA, C and S clear, Funduscopic exam not done. ENT:  ears, nose and throat reveal no gross abnormalities.  Dentition good. Neck:  supple, without massess. No JVD, thyromegaly or carotid bruits. Carotid upstroke normal. Chest:  normal symmetry, clear to auscultation. Cardiac:  regular rhythm, normal S1 and S2, No S3 or S4, no murmurs, gallops or rubs detected. Abdomen:  abdomen soft,non-tender, no masses, no hepatospenomegaly, or aneurysm noted Peripheral Pulses:  the femoral,dorsalis pedis, and posterior tibial pulses are full and equal bilaterally with no bruits auscultated. Extremities & Back:  no deformities, clubbing, cyanosis, erythema or edema observed. Normal muscle strength and tone. Neurological:  no gross motor or sensory deficits noted, affect appropriate, oriented x3. ____________________________ MOST RECENT LIPID PANEL 10/12/14  CHOL TOTL 189 mg/dl, LDL 105 NM, HDL 55 mg/dl, TRIGLYCER 145 mg/dl, CHOL/HDL 3.4 (Calc)   ____________________________ IMPRESSIONS/PLAN  1. Isolated palpitations with a normal cardiovascular examination and normal EKG 2. Dyspnea that is somewhat atypical. I would like to be sure that she does not have any structural heart disease for this although this could be associated with anxiety also. 2. Hypertension with elevation in the office today 3. Overweight 4. Anxiety  Recommendations:  Recent lab work and imaging was reviewed. It recommended that she wear a cardiac event monitor in order to characterize the palpitations. I will also like for her to have an echocardiogram to evaluate the dyspnea. Followup afterwards. ____________________________ TODAYS ORDERS  1. 12 Lead EKG: Today  2. [place]: Today  3. Return Visit: 1 month  4. 2D, color flow, doppler: At Patient Convenience                       ____________________________ Cardiology Physician:  Kerry Hough MD [facility]

## 2014-12-08 ENCOUNTER — Ambulatory Visit (AMBULATORY_SURGERY_CENTER): Payer: Self-pay | Admitting: *Deleted

## 2014-12-08 VITALS — Ht 67.5 in | Wt 173.0 lb

## 2014-12-08 DIAGNOSIS — Z1211 Encounter for screening for malignant neoplasm of colon: Secondary | ICD-10-CM

## 2014-12-08 MED ORDER — MOVIPREP 100 G PO SOLR: 1.0000 | Freq: Once | ORAL | Status: DC

## 2014-12-08 NOTE — Progress Notes (Signed)
No egg or soy allergy. No anesthesia problems.  No home O2.  No diet meds.  

## 2014-12-18 ENCOUNTER — Encounter: Payer: Self-pay | Admitting: Internal Medicine

## 2014-12-20 ENCOUNTER — Encounter: Payer: Self-pay | Admitting: Internal Medicine

## 2014-12-22 ENCOUNTER — Ambulatory Visit (AMBULATORY_SURGERY_CENTER): Payer: 59 | Admitting: Internal Medicine

## 2014-12-22 ENCOUNTER — Encounter: Payer: Self-pay | Admitting: Internal Medicine

## 2014-12-22 VITALS — BP 135/74 | HR 64 | Temp 98.2°F | Resp 19 | Ht 67.5 in | Wt 173.0 lb

## 2014-12-22 DIAGNOSIS — D120 Benign neoplasm of cecum: Secondary | ICD-10-CM | POA: Diagnosis not present

## 2014-12-22 DIAGNOSIS — Z1211 Encounter for screening for malignant neoplasm of colon: Secondary | ICD-10-CM

## 2014-12-22 HISTORY — PX: COLONOSCOPY: SHX174

## 2014-12-22 MED ORDER — SODIUM CHLORIDE 0.9 % IV SOLN: 500.0000 mL | INTRAVENOUS | Status: DC

## 2014-12-22 NOTE — Patient Instructions (Signed)
YOU HAD AN ENDOSCOPIC PROCEDURE TODAY AT [facility]:   Refer to the procedure report that was given to you for any specific questions about what was found during the examination.  If the procedure report does not answer your questions, please call your gastroenterologist to clarify.  If you requested that your care partner not be given the details of your procedure findings, then the procedure report has been included in a sealed envelope for you to review at your convenience later.  YOU SHOULD EXPECT: Some feelings of bloating in the abdomen. Passage of more gas than usual.  Walking can help get rid of the air that was put into your GI tract during the procedure and reduce the bloating. If you had a lower endoscopy (such as a colonoscopy or flexible sigmoidoscopy) you may notice spotting of blood in your stool or on the toilet paper. If you underwent a bowel prep for your procedure, you may not have a normal bowel movement for a few days.  Please Note:  You might notice some irritation and congestion in your nose or some drainage.  This is from the oxygen used during your procedure.  There is no need for concern and it should clear up in a day or so.  SYMPTOMS TO REPORT IMMEDIATELY:   Following lower endoscopy (colonoscopy or flexible sigmoidoscopy):  Excessive amounts of blood in the stool  Significant tenderness or worsening of abdominal pains  Swelling of the abdomen that is new, acute  Fever of 100F or higher  For urgent or emergent issues, a gastroenterologist can be reached at any hour by calling [phone].   DIET: Your first meal following the procedure should be a small meal and then it is ok to progress to your normal diet. Heavy or fried foods are harder to digest and may make you feel nauseous or bloated.  Likewise, meals heavy in dairy and vegetables can increase bloating.  Drink plenty of fluids but you should avoid alcoholic beverages for 24  hours.  ACTIVITY:  You should plan to take it easy for the rest of today and you should NOT DRIVE or use heavy machinery until tomorrow (because of the sedation medicines used during the test).    FOLLOW UP: Our staff will call the number listed on your records the next business day following your procedure to check on you and address any questions or concerns that you may have regarding the information given to you following your procedure. If we do not reach you, we will leave a message.  However, if you are feeling well and you are not experiencing any problems, there is no need to return our call.  We will assume that you have returned to your regular daily activities without incident.  If any biopsies were taken you will be contacted by phone or by letter within the next 1-3 weeks.  Please call us at [phone] if you have not heard about the biopsies in 3 weeks.    SIGNATURES/CONFIDENTIALITY: You and/or your care partner have signed paperwork which will be entered into your electronic medical record.  These signatures attest to the fact that that the information above on your After Visit Summary has been reviewed and is understood.  Full responsibility of the confidentiality of this discharge information lies with you and/or your care-partner.  Polyp and high fiber information given.

## 2014-12-22 NOTE — Op Note (Signed)
[place]  [address] [place], [id number]   COLONOSCOPY PROCEDURE REPORT  PATIENT: Samantha Terrell, Samantha Terrell  MR#: [id number] BIRTHDATE: Dec 19, 1960 , 54  yrs. old GENDER: female ENDOSCOPIST: [place] Dragon, MD REFERRED RA:XENMMHW Melford Aase, M.D. , Vicie Mutters PA PROCEDURE DATE:  12/22/2014 PROCEDURE:   Colonoscopy, screening and Colonoscopy with snare polypectomy First Screening Colonoscopy - Avg.  risk and is 50 yrs.  old or older Yes.  Prior Negative Screening - Now for repeat screening. N/A  History of Adenoma - Now for follow-up colonoscopy & has been > or = to 3 yrs.  N/A  Polyps removed today? Yes ASA CLASS:   Class I INDICATIONS:Screening for colonic neoplasia and Colorectal Neoplasm Risk Assessment for this procedure is average risk. MEDICATIONS: Monitored anesthesia care and Propofol 240 mg IV  DESCRIPTION OF PROCEDURE:   After the risks benefits and alternatives of the procedure were thoroughly explained, informed consent was obtained.  The digital rectal exam revealed no abnormalities of the rectum.   The LB [id number]  endoscope was introduced through the anus and advanced to the cecum, which was identified by both the appendix and ileocecal valve. No adverse events experienced.   The quality of the prep was good.  (MoviPrep was used)  The instrument was then slowly withdrawn as the colon was fully examined. Estimated blood loss is zero unless otherwise noted in this procedure report.      COLON FINDINGS: A polypoid shaped sessile polyp was found at the cecum. the polyp was very soft and difficult to grasp with the snare A polypectomy was performed in a piecemeal fashion with a cold snare.  The resection was complete, the polyp tissue was partially retrieved and sent to histology.  Multiple biopsies were performed using cold forceps.  Sample was obtained and sent to histology.  Retroflexed views revealed no abnormalities. The time to cecum =  8.01 Withdrawal time = 9.44   The scope was withdrawn and the procedure completed. COMPLICATIONS: There were no immediate complications.  ENDOSCOPIC IMPRESSION: Carpeted sessile polyp was found at the cecum; polypectomy was performed in a piecemeal fashion with a cold snare; multiple biopsies were performed using cold forceps , size of the polyp about 2 cm,  RECOMMENDATIONS: 1.  Await pathology results 2.  High fiber diet 3. Recall colonoscopy pending pathology report.   Colonoscopy in 2-3 years and ablate remaining polyp with APC depending on Path report   eSigned:  [place] Dragon, MD 12/22/2014 9:18 AM   cc:

## 2014-12-22 NOTE — Progress Notes (Signed)
Called to room to assist during endoscopic procedure.  Patient ID and intended procedure confirmed with present staff. Received instructions for my participation in the procedure from the performing physician.  

## 2014-12-22 NOTE — Progress Notes (Signed)
A/ox3 pleased with MAC, report to Jane RN 

## 2014-12-26 NOTE — Telephone Encounter (Signed)
No answer, left message to call if questions or concerns. 

## 2015-01-01 ENCOUNTER — Encounter: Payer: Self-pay | Admitting: Internal Medicine

## 2015-01-29 ENCOUNTER — Encounter: Payer: Self-pay | Admitting: *Deleted

## 2015-03-01 ENCOUNTER — Other Ambulatory Visit: Payer: Self-pay | Admitting: Physician Assistant

## 2015-04-03 ENCOUNTER — Other Ambulatory Visit: Payer: Self-pay | Admitting: Internal Medicine

## 2015-04-09 ENCOUNTER — Other Ambulatory Visit: Payer: Self-pay | Admitting: Internal Medicine

## 2015-04-24 ENCOUNTER — Ambulatory Visit (INDEPENDENT_AMBULATORY_CARE_PROVIDER_SITE_OTHER): Payer: 59 | Admitting: Physician Assistant

## 2015-04-24 ENCOUNTER — Encounter: Payer: Self-pay | Admitting: Physician Assistant

## 2015-04-24 VITALS — BP 100/60 | HR 76 | Temp 97.9°F | Resp 16 | Ht 67.5 in | Wt 177.0 lb

## 2015-04-24 DIAGNOSIS — F419 Anxiety disorder, unspecified: Secondary | ICD-10-CM | POA: Diagnosis not present

## 2015-04-24 DIAGNOSIS — D649 Anemia, unspecified: Secondary | ICD-10-CM | POA: Diagnosis not present

## 2015-04-24 DIAGNOSIS — E785 Hyperlipidemia, unspecified: Secondary | ICD-10-CM | POA: Insufficient documentation

## 2015-04-24 DIAGNOSIS — I10 Essential (primary) hypertension: Secondary | ICD-10-CM

## 2015-04-24 DIAGNOSIS — F329 Major depressive disorder, single episode, unspecified: Secondary | ICD-10-CM | POA: Diagnosis not present

## 2015-04-24 DIAGNOSIS — R590 Localized enlarged lymph nodes: Secondary | ICD-10-CM | POA: Diagnosis not present

## 2015-04-24 DIAGNOSIS — F32A Depression, unspecified: Secondary | ICD-10-CM

## 2015-04-24 HISTORY — DX: Anemia, unspecified: D64.9

## 2015-04-24 NOTE — Progress Notes (Signed)
Assessment and Plan:  1. Hypertension -Continue medication, monitor blood pressure at home. Continue DASH diet.  Reminder to go to the ER if any CP, SOB, nausea, dizziness, severe HA, changes vision/speech, left arm numbness and tingling and jaw pain.  2. Cholesterol -Continue diet and exercise. Check cholesterol.   3. Prediabetes  -Continue diet and exercise. Check A1C  4. Vitamin D Def - check level and continue medications.   5. Submandibular lymphadenopathy Has not enlarged, check labs, rule out SLE, HIV, infection If not better 6 months at CPE will get repeat CT  6. Palpitations Continue verapamil  Continue diet and meds as discussed. Further disposition pending results of labs. Over 30 minutes of exam, counseling, chart review, and critical decision making was performed  HPI 54 y.o. female  presents for 6 month follow up on hypertension, cholesterol, palpitations and vitamin D deficiency.   Her blood pressure has been controlled at home, today their BP is BP: 100/60 mmHg  She does not workout. She denies chest pain, shortness of breath, dizziness.  She is not on cholesterol medication and denies myalgias. Her cholesterol is at goal. The cholesterol last visit was:   Lab Results  Component Value Date   CHOL 189 10/12/2014   HDL 55 10/12/2014   LDLCALC 105* 10/12/2014   TRIG 145 10/12/2014   CHOLHDL 3.4 10/12/2014   Last A1C in the office was:  Lab Results  Component Value Date   HGBA1C 5.5 10/12/2014   Patient is on Vitamin D supplement.   Lab Results  Component Value Date   VD25OH 74 10/12/2014     Had colonoscopy with Dr. Olevia Perches 11/2014 Has seen Dr. Wynonia Lawman for palpitations, had echo with LVH, atrial septal aneurysm without PFO. holter showed PVCs. She has been on verapamil at night which has helped.  She also had some lymphadenopathy on bilateral submandibular and right cervical neck lymphnode, she had TDAP, flu shot and pneumonax at her work Friday and had  worsening lymphnode swelling including left supraclavicular, felt very tired but she is doing better today.  IMPRESSION: 1. Essentially normal sonographic appearance of the thyroid gland. There are a few tiny benign-appearing 2 mm thyroid cysts/nodules in the upper to mid right gland which are likely incidental. 2. Prominent bilateral submandibular and right cervical chain lymph nodes. The only node which is technically enlarged is at the right submandibular station. Consider further evaluation with CT scan of the neck with contrast.  Current Medications:  Current Outpatient Prescriptions on File Prior to Visit  Medication Sig Dispense Refill  . Multiple Vitamins-Minerals (HAIR/SKIN/NAILS PO) Take by mouth.    . sertraline (ZOLOFT) 100 MG tablet     . verapamil (CALAN-SR) 120 MG CR tablet TAKE 1 TABLET BY MOUTH AT BEDTIME 30 tablet 0  . Vitamin D, Cholecalciferol, 1000 UNITS CAPS Take by mouth.     No current facility-administered medications on file prior to visit.   Medical History:  Past Medical History  Diagnosis Date  . Hypertension   . Depression   . Anxiety   . Migraine     history, has not had in years  . PVC (premature ventricular contraction)   . Degenerative disc disease, cervical    Allergies: No Known Allergies   Review of Systems:  Review of Systems  Constitutional: Positive for malaise/fatigue. Negative for fever, chills, weight loss and diaphoresis.  HENT: Negative for congestion, ear discharge, ear pain, hearing loss, nosebleeds, sore throat and tinnitus.   Eyes: Negative.  Respiratory: Negative for cough, hemoptysis, sputum production, shortness of breath, wheezing and stridor.   Cardiovascular: Positive for palpitations. Negative for chest pain, orthopnea, claudication, leg swelling and PND.  Gastrointestinal: Negative.   Genitourinary: Negative.   Musculoskeletal: Positive for neck pain. Negative for myalgias, back pain, joint pain and falls.  Skin:  Negative.   Neurological: Negative for dizziness, tingling, tremors, sensory change, speech change, focal weakness, seizures, loss of consciousness, weakness and headaches.  Psychiatric/Behavioral: Negative for suicidal ideas, hallucinations, memory loss and substance abuse. The patient is not nervous/anxious and does not have insomnia.     Family history- Review and unchanged Social history- Review and unchanged Physical Exam: BP 100/60 mmHg  Pulse 76  Temp(Src) 97.9 F (36.6 C) (Temporal)  Resp 16  Ht 5' 7.5" (1.715 m)  Wt 177 lb (80.287 kg)  BMI 27.30 kg/m2  SpO2 98%  LMP 04/11/2015 (Approximate) Wt Readings from Last 3 Encounters:  04/24/15 177 lb (80.287 kg)  12/22/14 173 lb (78.472 kg)  12/08/14 173 lb (78.472 kg)   General Appearance: Well nourished, in no apparent distress. Eyes: PERRLA, EOMs, conjunctiva no swelling or erythema Sinuses: No Frontal/maxillary tenderness ENT/Mouth: Ext aud canals clear, TMs without erythema, bulging. No erythema, swelling, or exudate on post pharynx.  Tonsils not swollen or erythematous. Hearing normal.  Neck: Supple, thyroid normal.  Respiratory: Respiratory effort normal, BS equal bilaterally without rales, rhonchi, wheezing or stridor.  Cardio: RRR with no MRGs. Brisk peripheral pulses without edema.  Abdomen: Soft, + BS,  Non tender, no guarding, rebound, hernias, masses. Lymphatics: Non tender without lymphadenopathy.  Musculoskeletal: Full ROM, 5/5 strength, Normal gait Skin: Warm, dry without rashes, lesions, ecchymosis.  Neuro: Cranial nerves intact. Normal muscle tone, no cerebellar symptoms. Psych: Awake and oriented X 3, normal affect, Insight and Judgment appropriate.    Vicie Mutters, PA-C 3:39 PM [facility]

## 2015-04-24 NOTE — Patient Instructions (Signed)
Encourage you to get the 3D Mammogram  The 3D Mammogram is much more specific and sensitive to pick up breast cancer. For women with fibrocystic breast or lumpy breast it can be hard to determine if it is cancer or not but the 3D mammogram is able to tell this difference which cuts back on unneeded additional tests or scary call backs.   - over 40% increase in detection of breast cancer - over 40% reduction in false positives.  - fewer call backs - reduced anxiety - improved outcomes - [facility]

## 2015-04-25 LAB — IRON AND TIBC
%SAT: 24 % (ref 11–50)
IRON: 88 ug/dL (ref 45–160)
TIBC: 373 ug/dL (ref 250–450)
UIBC: 285 ug/dL (ref 125–400)

## 2015-04-25 LAB — HIV ANTIBODY (ROUTINE TESTING W REFLEX): HIV 1&2 Ab, 4th Generation: NONREACTIVE

## 2015-04-25 LAB — BASIC METABOLIC PANEL WITH GFR
BUN: 15 mg/dL (ref 7–25)
CALCIUM: 9.7 mg/dL (ref 8.6–10.4)
CO2: 26 mmol/L (ref 20–31)
CREATININE: 0.76 mg/dL (ref 0.50–1.05)
Chloride: 102 mmol/L (ref 98–110)
GLUCOSE: 80 mg/dL (ref 65–99)
Potassium: 4.7 mmol/L (ref 3.5–5.3)
SODIUM: 138 mmol/L (ref 135–146)

## 2015-04-25 LAB — HEPATIC FUNCTION PANEL
ALT: 18 U/L (ref 6–29)
AST: 20 U/L (ref 10–35)
Albumin: 4.1 g/dL (ref 3.6–5.1)
Alkaline Phosphatase: 78 U/L (ref 33–130)
BILIRUBIN DIRECT: 0.1 mg/dL (ref <=0.2)
BILIRUBIN INDIRECT: 0.3 mg/dL (ref 0.2–1.2)
TOTAL PROTEIN: 7.1 g/dL (ref 6.1–8.1)
Total Bilirubin: 0.4 mg/dL (ref 0.2–1.2)

## 2015-04-25 LAB — FERRITIN: FERRITIN: 61 ng/mL (ref 10–291)

## 2015-04-25 LAB — CBC WITH DIFFERENTIAL/PLATELET
BASOS PCT: 1 % (ref 0–1)
Basophils Absolute: 0.1 10*3/uL (ref 0.0–0.1)
EOS PCT: 4 % (ref 0–5)
Eosinophils Absolute: 0.3 10*3/uL (ref 0.0–0.7)
HCT: 37.7 % (ref 36.0–46.0)
Hemoglobin: 12.7 g/dL (ref 12.0–15.0)
Lymphocytes Relative: 28 % (ref 12–46)
Lymphs Abs: 2 10*3/uL (ref 0.7–4.0)
MCH: 29.6 pg (ref 26.0–34.0)
MCHC: 33.7 g/dL (ref 30.0–36.0)
MCV: 87.9 fL (ref 78.0–100.0)
MONO ABS: 0.7 10*3/uL (ref 0.1–1.0)
MPV: 8.8 fL (ref 8.6–12.4)
Monocytes Relative: 10 % (ref 3–12)
Neutro Abs: 4 10*3/uL (ref 1.7–7.7)
Neutrophils Relative %: 57 % (ref 43–77)
Platelets: 269 10*3/uL (ref 150–400)
RBC: 4.29 MIL/uL (ref 3.87–5.11)
RDW: 13.1 % (ref 11.5–15.5)
WBC: 7.1 10*3/uL (ref 4.0–10.5)

## 2015-04-25 LAB — TSH: TSH: 1.683 u[IU]/mL (ref 0.350–4.500)

## 2015-04-25 LAB — ANA: Anti Nuclear Antibody(ANA): NEGATIVE

## 2015-04-25 LAB — SEDIMENTATION RATE: Sed Rate: 17 mm/hr (ref 0–30)

## 2015-04-26 LAB — ANTI-DNA ANTIBODY, DOUBLE-STRANDED: ds DNA Ab: <1 IU/mL

## 2015-06-08 ENCOUNTER — Other Ambulatory Visit: Payer: Self-pay | Admitting: Physician Assistant

## 2015-10-17 ENCOUNTER — Encounter: Payer: Self-pay | Admitting: Physician Assistant

## 2015-10-17 ENCOUNTER — Ambulatory Visit (INDEPENDENT_AMBULATORY_CARE_PROVIDER_SITE_OTHER): Payer: 59 | Admitting: Physician Assistant

## 2015-10-17 VITALS — BP 118/74 | HR 67 | Temp 97.7°F | Resp 16 | Ht 67.5 in | Wt 177.2 lb

## 2015-10-17 DIAGNOSIS — E785 Hyperlipidemia, unspecified: Secondary | ICD-10-CM

## 2015-10-17 DIAGNOSIS — Z0000 Encounter for general adult medical examination without abnormal findings: Secondary | ICD-10-CM

## 2015-10-17 DIAGNOSIS — I253 Aneurysm of heart: Secondary | ICD-10-CM

## 2015-10-17 DIAGNOSIS — Z79899 Other long term (current) drug therapy: Secondary | ICD-10-CM | POA: Insufficient documentation

## 2015-10-17 DIAGNOSIS — F329 Major depressive disorder, single episode, unspecified: Secondary | ICD-10-CM

## 2015-10-17 DIAGNOSIS — Z1389 Encounter for screening for other disorder: Secondary | ICD-10-CM

## 2015-10-17 DIAGNOSIS — Z0001 Encounter for general adult medical examination with abnormal findings: Secondary | ICD-10-CM

## 2015-10-17 DIAGNOSIS — E559 Vitamin D deficiency, unspecified: Secondary | ICD-10-CM | POA: Insufficient documentation

## 2015-10-17 DIAGNOSIS — I10 Essential (primary) hypertension: Secondary | ICD-10-CM | POA: Diagnosis not present

## 2015-10-17 DIAGNOSIS — F419 Anxiety disorder, unspecified: Secondary | ICD-10-CM

## 2015-10-17 DIAGNOSIS — F32A Depression, unspecified: Secondary | ICD-10-CM

## 2015-10-17 DIAGNOSIS — R590 Localized enlarged lymph nodes: Secondary | ICD-10-CM

## 2015-10-17 DIAGNOSIS — D649 Anemia, unspecified: Secondary | ICD-10-CM

## 2015-10-17 NOTE — Progress Notes (Signed)
Complete Physical  Assessment and Plan: 1. Essential hypertension - CBC with Differential/Platelet - BASIC METABOLIC PANEL WITH GFR - Hepatic function panel - TSH - EKG 12-Lead  2. Hyperlipidemia - Lipid panel  3. Anemia, unspecified anemia type - Iron and TIBC - Ferritin - Vitamin B12  4. Depression Continue meds  5. Anxiety Continue meds  6. Encounter for general adult medical examination with abnormal findings   7. Medication management - Magnesium  8. Vitamin D deficiency - VITAMIN D 25 Hydroxy (Vit-D Deficiency, Fractures)  9. Screening for blood or protein in urine - Urinalysis, Routine w reflex microscopic (not at [facility]) - Microalbumin / creatinine urine ratio  10. Lymphadenopathy, submandibular Feels unchanged from last year, still enlarged, discussed referring to Dr. Lucia Gaskins versus further testing, patient declines at this time due to cost- if any larger, any voice hoarseness, trouble swallowing, etc go to ER or make appointment for follow up  11. Atrial septal aneurysm   Discussed med's effects and SE's. Screening labs and tests as requested with regular follow-up as recommended.  HPI  55 y.o. female  presents to establish as a new patient, her parents and husband are patients here.   Her blood pressure has been controlled at home, today their BP is BP: 118/74 mmHg She does workout. She denies chest pain, shortness of breath, dizziness.  Has mainly been seeing Dr. Philis Pique ( OB/GYN) and UC.  She is on zoloft for anxiety and on verapamil for palpitations, has seen Dr. Fidela Juneau, had echo with LVH, atrial septal aneursym.  She has has had bilateral submandiublar lymphnode swelling since 3 series shot, had Ct that showed normal size lymph nodes, still enlarged, no pain, not larger than before, no trouble swallowing, may refer to ENT for possible evaluation.   She is not on cholesterol medication and denies myalgias. Her cholesterol is at goal. The cholesterol last  visit was:   Lab Results  Component Value Date   CHOL 189 10/12/2014   HDL 55 10/12/2014   LDLCALC 105* 10/12/2014   TRIG 145 10/12/2014   CHOLHDL 3.4 10/12/2014    She has been working on diet and exercise for prediabetes, and denies paresthesia of the feet, polydipsia, polyuria and visual disturbances. Last A1C in the office was:  Lab Results  Component Value Date   HGBA1C 5.5 10/12/2014   Patient is on Vitamin D supplement.   Lab Results  Component Value Date   VD25OH 74 10/12/2014     BMI is Body mass index is 27.33 kg/(m^2)., she is working on diet and exercise. Wt Readings from Last 3 Encounters:  10/17/15 177 lb 3.2 oz (80.377 kg)  04/24/15 177 lb (80.287 kg)  12/22/14 173 lb (78.472 kg)      Current Medications:  Current Outpatient Prescriptions on File Prior to Visit  Medication Sig Dispense Refill  . Multiple Vitamins-Minerals (HAIR/SKIN/NAILS PO) Take by mouth.    . norethindrone-ethinyl estradiol (FEMHRT 1/5) 1-5 MG-MCG TABS tablet Take 1 tablet by mouth daily.    . sertraline (ZOLOFT) 100 MG tablet     . verapamil (CALAN-SR) 120 MG CR tablet TAKE 1 TABLET BY MOUTH AT BEDTIME 90 tablet 1  . Vitamin D, Cholecalciferol, 1000 UNITS CAPS Take by mouth.     No current facility-administered medications on file prior to visit.    Health Maintenance:   Immunization History  Administered Date(s) Administered  . Influenza-Unspecified 04/20/2015  . Pneumococcal Polysaccharide-23 04/20/2015  . Tdap 04/20/2015   Tetanus: 2016 Pneumovax:  2016 Flu vaccine: 2016 Zostavax: LMP: 2 weeks ago Pap: yearly with Dr. Philis Pique MGM: yearly , dense breast will th 3D  DEXA: N/A Colonoscopy: 11/2014, Dr. Olevia Perches EGD: N/A Last Dental Exam: Dr. Trenton Gammon Last Eye Exam: recent exam, wears glasses, [facility]  Allergies: No Known Allergies Medical History:  Past Medical History  Diagnosis Date  . Hypertension   . Depression   . Anxiety   . Migraine     history, has  not had in years  . PVC (premature ventricular contraction)   . Degenerative disc disease, cervical    Surgical History:  Past Surgical History  Procedure Laterality Date  . Nasal reconstruction  1986    Septal repair   Family History:  Family History  Problem Relation Age of Onset  . Cancer Maternal Grandmother     cervical cancer  . Osteoporosis Maternal Grandmother   . Heart attack Maternal Grandmother     age 107  . Heart failure Maternal Grandmother     age 71  . Arthritis Mother     bilateral knees  . Arthritis Maternal Aunt     hands  . Osteoporosis Paternal Grandmother   . Colon cancer Neg Hx    Social History:  Social History  Substance Use Topics  . Smoking status: Never Smoker   . Smokeless tobacco: Never Used  . Alcohol Use: 8.4 oz/week    0 Standard drinks or equivalent, 14 Cans of beer per week     Comment: beer     Review of Systems: Review of Systems  Constitutional: Positive for malaise/fatigue. Negative for fever, chills, weight loss and diaphoresis.  HENT: Negative for congestion, ear discharge, ear pain, hearing loss, nosebleeds, sore throat and tinnitus.   Eyes: Negative.   Respiratory: Positive for shortness of breath. Negative for cough, hemoptysis, sputum production, wheezing and stridor.   Cardiovascular: Positive for palpitations. Negative for chest pain, orthopnea, claudication, leg swelling and PND.  Gastrointestinal: Negative.   Genitourinary: Negative.   Musculoskeletal: Positive for neck pain. Negative for myalgias, back pain, joint pain and falls.  Skin: Negative.   Neurological: Positive for headaches. Negative for dizziness, tingling, tremors, sensory change, speech change, focal weakness, seizures, loss of consciousness and weakness.  Psychiatric/Behavioral: Positive for depression. Negative for suicidal ideas, hallucinations, memory loss and substance abuse. The patient is nervous/anxious. The patient does not have insomnia.      Physical Exam: Estimated body mass index is 27.33 kg/(m^2) as calculated from the following:   Height as of this encounter: 5' 7.5" (1.715 m).   Weight as of this encounter: 177 lb 3.2 oz (80.377 kg). BP 118/74 mmHg  Pulse 67  Temp(Src) 97.7 F (36.5 C) (Temporal)  Resp 16  Ht 5' 7.5" (1.715 m)  Wt 177 lb 3.2 oz (80.377 kg)  BMI 27.33 kg/m2  SpO2 97% General Appearance: Well nourished, in no apparent distress.  Eyes: PERRLA, EOMs, conjunctiva no swelling or erythema, normal fundi and vessels.  Sinuses: No Frontal/maxillary tenderness  ENT/Mouth: Ext aud canals clear, normal light reflex with TMs without erythema, bulging. Good dentition. No erythema, swelling, or exudate on post pharynx. Tonsils not swollen or erythematous. Hearing normal. + TMJ bilateral Neck: Supple, thyroid nodules. No bruits  Respiratory: Respiratory effort normal, BS equal bilaterally without rales, rhonchi, wheezing or stridor.  Cardio: RRR without murmurs, rubs or gallops. Brisk peripheral pulses without edema.  Chest: symmetric, with normal excursions and percussion.  Breasts: defer  Abdomen: Soft, nontender, no guarding,  rebound, hernias, masses, or organomegaly. .  Lymphatics: Non tender bilateral submetal lymphadenopathy, unchanged from last visit.   Genitourinary: defer Musculoskeletal: Full ROM all peripheral extremities,5/5 strength, and normal gait. Skin: Warm, dry without rashes, lesions, ecchymosis. Neuro: Cranial nerves intact, reflexes equal bilaterally. Normal muscle tone, no cerebellar symptoms. Sensation intact.  Psych: Awake and oriented X 3, normal affect, Insight and Judgment appropriate.   EKG: WNL no changes.   Vicie Mutters 3:22 PM [facility]

## 2015-10-17 NOTE — Patient Instructions (Signed)
WE CAN REFER TO DR. Lucia Gaskins ABOUT YOUR LYMPHNODES IF YOU CHANGE YOUR MIND OR GET ANOTHER [place]  Preventive Care for Adults  A healthy lifestyle and preventive care can promote health and wellness. Preventive health guidelines for men include the following key practices:  A routine yearly physical is a good way to check with your health care provider about your health and preventative screening. It is a chance to share any concerns and updates on your health and to receive a thorough exam.  Visit your dentist for a routine exam and preventative care every 6 months. Brush your teeth twice a day and floss once a day. Good oral hygiene prevents tooth decay and gum disease.  The frequency of eye exams is based on your age, health, family medical history, use of contact lenses, and other factors. Follow your health care provider's recommendations for frequency of eye exams.  Eat a healthy diet. Foods such as vegetables, fruits, whole grains, low-fat dairy products, and lean protein foods contain the nutrients you need without too many calories. Decrease your intake of foods high in solid fats, added sugars, and salt. Eat the right amount of calories for you.Get information about a proper diet from your health care provider, if necessary.  Regular physical exercise is one of the most important things you can do for your health. Most adults should get at least 150 minutes of moderate-intensity exercise (any activity that increases your heart rate and causes you to sweat) each week. In addition, most adults need muscle-strengthening exercises on 2 or more days a week.  Maintain a healthy weight. The body mass index (BMI) is a screening tool to identify possible weight problems. It provides an estimate of body fat based on height and weight. Your health care provider can find your BMI and can help you achieve or maintain a healthy weight.For adults 20 years and older:  A BMI below 18.5 is considered  underweight.  A BMI of 18.5 to 24.9 is normal.  A BMI of 25 to 29.9 is considered overweight.  A BMI of 30 and above is considered obese.  Maintain normal blood lipids and cholesterol levels by exercising and minimizing your intake of saturated fat. Eat a balanced diet with plenty of fruit and vegetables. Blood tests for lipids and cholesterol should begin at age 3 and be repeated every 5 years. If your lipid or cholesterol levels are high, you are over 50, or you are at high risk for heart disease, you may need your cholesterol levels checked more frequently.Ongoing high lipid and cholesterol levels should be treated with medicines if diet and exercise are not working.  If you smoke, find out from your health care provider how to quit. If you do not use tobacco, do not start.  Lung cancer screening is recommended for adults aged 71-80 years who are at high risk for developing lung cancer because of a history of smoking. A yearly low-dose CT scan of the lungs is recommended for people who have at least a 30-pack-year history of smoking and are a current smoker or have quit within the past 15 years. A pack year of smoking is smoking an average of 1 pack of cigarettes a day for 1 year (for example: 1 pack a day for 30 years or 2 packs a day for 15 years). Yearly screening should continue until the smoker has stopped smoking for at least 15 years. Yearly screening should be stopped for people who develop a health problem that  would prevent them from having lung cancer treatment.  If you choose to drink alcohol, do not have more than 2 drinks per day. One drink is considered to be 12 ounces (355 mL) of beer, 5 ounces (148 mL) of wine, or 1.5 ounces (44 mL) of liquor.  Avoid use of street drugs. Do not share needles with anyone. Ask for help if you need support or instructions about stopping the use of drugs.  High blood pressure causes heart disease and increases the risk of stroke. Your blood  pressure should be checked at least every 1-2 years. Ongoing high blood pressure should be treated with medicines, if weight loss and exercise are not effective.  If you are 59-78 years old, ask your health care provider if you should take aspirin to prevent heart disease.  Diabetes screening involves taking a blood sample to check your fasting blood sugar level. This should be done once every 3 years, after age 16, if you are within normal weight and without risk factors for diabetes. Testing should be considered at a younger age or be carried out more frequently if you are overweight and have at least 1 risk factor for diabetes.  Colorectal cancer can be detected and often prevented. Most routine colorectal cancer screening begins at the age of 31 and continues through age 69. However, your health care provider may recommend screening at an earlier age if you have risk factors for colon cancer. On a yearly basis, your health care provider may provide home test kits to check for hidden blood in the stool. Use of a small camera at the end of a tube to directly examine the colon (sigmoidoscopy or colonoscopy) can detect the earliest forms of colorectal cancer. Talk to your health care provider about this at age 42, when routine screening begins. Direct exam of the colon should be repeated every 5-10 years through age 73, unless early forms of precancerous polyps or small growths are found.   Talk with your health care provider about prostate cancer screening.  Testicular cancer screening isrecommended for adult males. Screening includes self-exam, a health care provider exam, and other screening tests. Consult with your health care provider about any symptoms you have or any concerns you have about testicular cancer.  Use sunscreen. Apply sunscreen liberally and repeatedly throughout the day. You should seek shade when your shadow is shorter than you. Protect yourself by wearing long sleeves, pants, a  wide-brimmed hat, and sunglasses year round, whenever you are outdoors.  Once a month, do a whole-body skin exam, using a mirror to look at the skin on your back. Tell your health care provider about new moles, moles that have irregular borders, moles that are larger than a pencil eraser, or moles that have changed in shape or color.  Stay current with required vaccines (immunizations).  Influenza vaccine. All adults should be immunized every year.  Tetanus, diphtheria, and acellular pertussis (Td, Tdap) vaccine. An adult who has not previously received Tdap or who does not know his vaccine status should receive 1 dose of Tdap. This initial dose should be followed by tetanus and diphtheria toxoids (Td) booster doses every 10 years. Adults with an unknown or incomplete history of completing a 3-dose immunization series with Td-containing vaccines should begin or complete a primary immunization series including a Tdap dose. Adults should receive a Td booster every 10 years.  Varicella vaccine. An adult without evidence of immunity to varicella should receive 2 doses or a second dose  if he has previously received 1 dose.  Human papillomavirus (HPV) vaccine. Males aged 17-21 years who have not received the vaccine previously should receive the 3-dose series. Males aged 22-26 years may be immunized. Immunization is recommended through the age of 80 years for any female who has sex with males and did not get any or all doses earlier. Immunization is recommended for any person with an immunocompromised condition through the age of 96 years if he did not get any or all doses earlier. During the 3-dose series, the second dose should be obtained 4-8 weeks after the first dose. The third dose should be obtained 24 weeks after the first dose and 16 weeks after the second dose.  Zoster vaccine. One dose is recommended for adults aged 46 years or older unless certain conditions are present.    PREVNAR  -  Pneumococcal 13-valent conjugate (PCV13) vaccine. When indicated, a person who is uncertain of his immunization history and has no record of immunization should receive the PCV13 vaccine. An adult aged 56 years or older who has certain medical conditions and has not been previously immunized should receive 1 dose of PCV13 vaccine. This PCV13 should be followed with a dose of pneumococcal polysaccharide (PPSV23) vaccine. The PPSV23 vaccine dose should be obtained at least 8 weeks after the dose of PCV13 vaccine. An adult aged 76 years or older who has certain medical conditions and previously received 1 or more doses of PPSV23 vaccine should receive 1 dose of PCV13. The PCV13 vaccine dose should be obtained 1 or more years after the last PPSV23 vaccine dose.    PNEUMOVAX - Pneumococcal polysaccharide (PPSV23) vaccine. When PCV13 is also indicated, PCV13 should be obtained first. All adults aged 61 years and older should be immunized. An adult younger than age 66 years who has certain medical conditions should be immunized. Any person who resides in a nursing home or long-term care facility should be immunized. An adult smoker should be immunized. People with an immunocompromised condition and certain other conditions should receive both PCV13 and PPSV23 vaccines. People with human immunodeficiency virus (HIV) infection should be immunized as soon as possible after diagnosis. Immunization during chemotherapy or radiation therapy should be avoided. Routine use of PPSV23 vaccine is not recommended for American Indians, [place] Natives, or people younger than 65 years unless there are medical conditions that require PPSV23 vaccine. When indicated, people who have unknown immunization and have no record of immunization should receive PPSV23 vaccine. One-time revaccination 5 years after the first dose of PPSV23 is recommended for people aged 19-64 years who have chronic kidney failure, nephrotic syndrome, asplenia, or  immunocompromised conditions. People who received 1-2 doses of PPSV23 before age 87 years should receive another dose of PPSV23 vaccine at age 57 years or later if at least 5 years have passed since the previous dose. Doses of PPSV23 are not needed for people immunized with PPSV23 at or after age 98 years.    Hepatitis A vaccine. Adults who wish to be protected from this disease, have certain high-risk conditions, work with hepatitis A-infected animals, work in hepatitis A research labs, or travel to or work in countries with a high rate of hepatitis A should be immunized. Adults who were previously unvaccinated and who anticipate close contact with an international adoptee during the first 60 days after arrival in the [place] from a country with a high rate of hepatitis A should be immunized.    Hepatitis B vaccine. Adults should  be immunized if they wish to be protected from this disease, have certain high-risk conditions, may be exposed to blood or other infectious body fluids, are household contacts or sex partners of hepatitis B positive people, are clients or workers in certain care facilities, or travel to or work in countries with a high rate of hepatitis B.   Preventive Service / Frequency   Ages 58 to 40  Blood pressure check.  Lipid and cholesterol check  Lung cancer screening. / Every year if you are aged 79-80 years and have a 30-pack-year history of smoking and currently smoke or have quit within the past 15 years. Yearly screening is stopped once you have quit smoking for at least 15 years or develop a health problem that would prevent you from having lung cancer treatment.  Fecal occult blood test (FOBT) of stool. / Every year beginning at age 80 and continuing until age 3. You may not have to do this test if you get a colonoscopy every 10 years.  Flexible sigmoidoscopy** or colonoscopy.** / Every 5 years for a flexible sigmoidoscopy or every 10 years for a colonoscopy  beginning at age 45 and continuing until age 68. Screening for abdominal aortic aneurysm (AAA)  by ultrasound is recommended for people who have history of high blood pressure or who are current or former smokers.

## 2015-10-18 LAB — URINALYSIS, ROUTINE W REFLEX MICROSCOPIC
BILIRUBIN URINE: NEGATIVE
GLUCOSE, UA: NEGATIVE
Hgb urine dipstick: NEGATIVE
KETONES UR: NEGATIVE
Leukocytes, UA: NEGATIVE
Nitrite: NEGATIVE
PROTEIN: NEGATIVE
Specific Gravity, Urine: 1.018 (ref 1.001–1.035)
pH: 6 (ref 5.0–8.0)

## 2015-10-18 LAB — CBC WITH DIFFERENTIAL/PLATELET
BASOS PCT: 1 % (ref 0–1)
Basophils Absolute: 0.1 10*3/uL (ref 0.0–0.1)
Eosinophils Absolute: 0.1 10*3/uL (ref 0.0–0.7)
Eosinophils Relative: 1 % (ref 0–5)
HEMATOCRIT: 41.7 % (ref 36.0–46.0)
HEMOGLOBIN: 13.8 g/dL (ref 12.0–15.0)
LYMPHS PCT: 34 % (ref 12–46)
Lymphs Abs: 2.9 10*3/uL (ref 0.7–4.0)
MCH: 29.6 pg (ref 26.0–34.0)
MCHC: 33.1 g/dL (ref 30.0–36.0)
MCV: 89.3 fL (ref 78.0–100.0)
MONO ABS: 0.6 10*3/uL (ref 0.1–1.0)
MONOS PCT: 7 % (ref 3–12)
MPV: 9.1 fL (ref 8.6–12.4)
NEUTROS ABS: 4.8 10*3/uL (ref 1.7–7.7)
NEUTROS PCT: 57 % (ref 43–77)
Platelets: 288 10*3/uL (ref 150–400)
RBC: 4.67 MIL/uL (ref 3.87–5.11)
RDW: 12.9 % (ref 11.5–15.5)
WBC: 8.5 10*3/uL (ref 4.0–10.5)

## 2015-10-18 LAB — IRON AND TIBC
%SAT: 29 % (ref 11–50)
IRON: 117 ug/dL (ref 45–160)
TIBC: 399 ug/dL (ref 250–450)
UIBC: 282 ug/dL (ref 125–400)

## 2015-10-18 LAB — FERRITIN: FERRITIN: 41 ng/mL (ref 10–232)

## 2015-10-18 LAB — LIPID PANEL
CHOL/HDL RATIO: 3.5 ratio (ref <=5.0)
Cholesterol: 204 mg/dL — ABNORMAL HIGH (ref 125–200)
HDL: 58 mg/dL (ref >=46)
LDL CALC: 128 mg/dL (ref <130)
Triglycerides: 90 mg/dL (ref <150)
VLDL: 18 mg/dL (ref <30)

## 2015-10-18 LAB — MICROALBUMIN / CREATININE URINE RATIO
CREATININE, URINE: 87 mg/dL (ref 20–320)
MICROALB UR: 0.2 mg/dL
Microalb Creat Ratio: 2 mcg/mg creat (ref <30)

## 2015-10-18 LAB — HEPATIC FUNCTION PANEL
ALK PHOS: 75 U/L (ref 33–130)
ALT: 15 U/L (ref 6–29)
AST: 18 U/L (ref 10–35)
Albumin: 4.3 g/dL (ref 3.6–5.1)
BILIRUBIN DIRECT: 0.1 mg/dL (ref <=0.2)
BILIRUBIN INDIRECT: 0.5 mg/dL (ref 0.2–1.2)
TOTAL PROTEIN: 7.2 g/dL (ref 6.1–8.1)
Total Bilirubin: 0.6 mg/dL (ref 0.2–1.2)

## 2015-10-18 LAB — BASIC METABOLIC PANEL WITH GFR
BUN: 13 mg/dL (ref 7–25)
CALCIUM: 9.3 mg/dL (ref 8.6–10.4)
CHLORIDE: 102 mmol/L (ref 98–110)
CO2: 24 mmol/L (ref 20–31)
Creat: 0.78 mg/dL (ref 0.50–1.05)
GFR, EST NON AFRICAN AMERICAN: 87 mL/min (ref >=60)
GFR, Est African American: >89 mL/min (ref >=60)
GLUCOSE: 76 mg/dL (ref 65–99)
Potassium: 4.3 mmol/L (ref 3.5–5.3)
SODIUM: 138 mmol/L (ref 135–146)

## 2015-10-18 LAB — MAGNESIUM: MAGNESIUM: 1.8 mg/dL (ref 1.5–2.5)

## 2015-10-18 LAB — TSH: TSH: 1.4 mIU/L

## 2015-10-18 LAB — VITAMIN D 25 HYDROXY (VIT D DEFICIENCY, FRACTURES): VIT D 25 HYDROXY: 59 ng/mL (ref 30–100)

## 2015-10-18 LAB — VITAMIN B12: Vitamin B-12: 342 pg/mL (ref 200–1100)

## 2015-12-09 ENCOUNTER — Other Ambulatory Visit: Payer: Self-pay | Admitting: Internal Medicine

## 2016-01-24 ENCOUNTER — Encounter: Payer: Self-pay | Admitting: Physician Assistant

## 2016-01-24 DIAGNOSIS — R590 Localized enlarged lymph nodes: Secondary | ICD-10-CM

## 2016-02-21 ENCOUNTER — Encounter: Payer: Self-pay | Admitting: Physician Assistant

## 2016-02-21 ENCOUNTER — Encounter: Payer: Self-pay | Admitting: Internal Medicine

## 2016-02-21 ENCOUNTER — Ambulatory Visit (INDEPENDENT_AMBULATORY_CARE_PROVIDER_SITE_OTHER): Payer: 59 | Admitting: Internal Medicine

## 2016-02-21 VITALS — BP 126/88 | HR 64 | Temp 97.5°F | Ht 67.5 in | Wt 181.2 lb

## 2016-02-21 DIAGNOSIS — M545 Low back pain, unspecified: Secondary | ICD-10-CM

## 2016-02-21 MED ORDER — PREDNISONE 20 MG PO TABS: ORAL_TABLET | ORAL | 0 refills | Status: DC

## 2016-02-21 MED ORDER — CYCLOBENZAPRINE HCL 10 MG PO TABS: ORAL_TABLET | ORAL | 1 refills | Status: DC

## 2016-02-21 NOTE — Patient Instructions (Addendum)
Recommend no WORK on 7/27-7/30/2017  +++++++++++++++++++++++++++++++++++++++++++++++++++ Lumbosacral Strain Lumbosacral strain is a strain of any of the parts that make up your lumbosacral vertebrae. Your lumbosacral vertebrae are the bones that make up the lower third of your backbone. Your lumbosacral vertebrae are held together by muscles and tough, fibrous tissue (ligaments).  CAUSES  A sudden blow to your back can cause lumbosacral strain. Also, anything that causes an excessive stretch of the muscles in the low back can cause this strain. This is typically seen when people exert themselves strenuously, fall, lift heavy objects, bend, or crouch repeatedly. RISK FACTORS  Physically demanding work.  Participation in pushing or pulling sports or sports that require a sudden twist of the back (tennis, golf, baseball).  Weight lifting.  Excessive lower back curvature.  Forward-tilted pelvis.  Weak back or abdominal muscles or both.  Tight hamstrings. SIGNS AND SYMPTOMS  Lumbosacral strain may cause pain in the area of your injury or pain that moves (radiates) down your leg.  DIAGNOSIS Your health care provider can often diagnose lumbosacral strain through a physical exam. In some cases, you may need tests such as X-ray exams.  TREATMENT  Treatment for your lower back injury depends on many factors that your clinician will have to evaluate. However, most treatment will include the use of anti-inflammatory medicines. HOME CARE INSTRUCTIONS   Avoid hard physical activities (tennis, racquetball, waterskiing) if you are not in proper physical condition for it. This may aggravate or create problems.  If you have a back problem, avoid sports requiring sudden body movements. Swimming and walking are generally safer activities.  Maintain good posture.  Maintain a healthy weight.  For acute conditions, you may put ice on the injured area.  Put ice in a plastic bag.  Place a towel  between your skin and the bag.  Leave the ice on for 20 minutes, 2-3 times a day.  When the low back starts healing, stretching and strengthening exercises may be recommended. SEEK MEDICAL CARE IF:  Your back pain is getting worse.  You experience severe back pain not relieved with medicines. SEEK IMMEDIATE MEDICAL CARE IF:   You have numbness, tingling, weakness, or problems with the use of your arms or legs.  There is a change in bowel or bladder control.  You have increasing pain in any area of the body, including your belly (abdomen).  You notice shortness of breath, dizziness, or feel faint.  You feel sick to your stomach (nauseous), are throwing up (vomiting), or become sweaty.  You notice discoloration of your toes or legs, or your feet get very cold. MAKE SURE YOU:   Understand these instructions.  Will watch your condition.  Will get help right away if you are not doing well or get worse.   ++++++++++++++++++++++++++++++++++++++++++++++++++++++++++++   Back Pain, Adult Back pain is very common in adults.The cause of back pain is rarely dangerous and the pain often gets better over time.The cause of your back pain may not be known. Some common causes of back pain include:  Strain of the muscles or ligaments supporting the spine.  Wear and tear (degeneration) of the spinal disks.  Arthritis.  Direct injury to the back. For many people, back pain may return. Since back pain is rarely dangerous, most people can learn to manage this condition on their own. HOME CARE INSTRUCTIONS Watch your back pain for any changes. The following actions may help to lessen any discomfort you are feeling:  Remain active. It  is stressful on your back to sit or stand in one place for long periods of time. Do not sit, drive, or stand in one place for more than 30 minutes at a time. Take short walks on even surfaces as soon as you are able.Try to increase the length of time you walk  each day.  Exercise regularly as directed by your health care provider. Exercise helps your back heal faster. It also helps avoid future injury by keeping your muscles strong and flexible.  Do not stay in bed.Resting more than 1-2 days can delay your recovery.  Pay attention to your body when you bend and lift. The most comfortable positions are those that put less stress on your recovering back. Always use proper lifting techniques, including:  Bending your knees.  Keeping the load close to your body.  Avoiding twisting.  Find a comfortable position to sleep. Use a firm mattress and lie on your side with your knees slightly bent. If you lie on your back, put a pillow under your knees.  Avoid feeling anxious or stressed.Stress increases muscle tension and can worsen back pain.It is important to recognize when you are anxious or stressed and learn ways to manage it, such as with exercise.  Take medicines only as directed by your health care provider. Over-the-counter medicines to reduce pain and inflammation are often the most helpful.Your health care provider may prescribe muscle relaxant drugs.These medicines help dull your pain so you can more quickly return to your normal activities and healthy exercise.  Apply ice to the injured area:  Put ice in a plastic bag.  Place a towel between your skin and the bag.  Leave the ice on for 20 minutes, 2-3 times a day for the first 2-3 days. After that, ice and heat may be alternated to reduce pain and spasms.  Maintain a healthy weight. Excess weight puts extra stress on your back and makes it difficult to maintain good posture. SEEK MEDICAL CARE IF:  You have pain that is not relieved with rest or medicine.  You have increasing pain going down into the legs or buttocks.  You have pain that does not improve in one week.  You have night pain.  You lose weight.  You have a fever or chills. SEEK IMMEDIATE MEDICAL CARE IF:   You  develop new bowel or bladder control problems.  You have unusual weakness or numbness in your arms or legs.  You develop nausea or vomiting.  You develop abdominal pain.  You feel faint.   This information is not intended to replace advice given to you by your health care provider. Make sure you discuss any questions you have with your health care provider.   Document Released: 07/14/2005 Document Revised: 08/04/2014 Document Reviewed: 11/15/2013 Elsevier Interactive Patient Education [employer].

## 2016-02-23 NOTE — Progress Notes (Signed)
  Subjective:    Patient ID: Samantha Terrell, female    DOB: 31-Jul-1960, 55 y.o.   MRN: [id number]  HPI  This very nice 55 yo ME+WF presented with 24-36 hr hx/o LBP w/o precipitating injury, alto she notes the pain started shortly after "twisting at the waist". Denies any sciatica.   Outpatient Medications Prior to Visit  Medication Sig Dispense Refill  . Multiple Vitamins-Minerals (HAIR/SKIN/NAILS PO) Take by mouth.    . norethindrone-ethinyl estradiol (FEMHRT 1/5) 1-5 MG-MCG TABS tablet Take 1 tablet by mouth daily.    . sertraline (ZOLOFT) 100 MG tablet     . verapamil (CALAN-SR) 120 MG CR tablet TAKE 1 TABLET BY MOUTH AT BEDTIME 90 tablet 1  . Vitamin D, Cholecalciferol, 1000 UNITS CAPS Take by mouth.     No facility-administered medications prior to visit.    No Known Allergies Past Medical History:  Diagnosis Date  . Anxiety   . Degenerative disc disease, cervical   . Depression   . Hypertension   . Migraine    history, has not had in years  . PVC (premature ventricular contraction)    Past Surgical History:  Procedure Laterality Date  . NASAL RECONSTRUCTION  1986   Septal repair   Review of Systems  10 point systems review negative except as above.    Objective:   Physical Exam  BP 126/88   Pulse 64   Temp 97.5 F (36.4 C)   Ht 5' 7.5" (1.715 m)   Wt 181 lb 3.2 oz (82.2 kg)   BMI 27.96 kg/m   HEENT - Eac's patent. TM's Nl. EOM's full. PERRLA. NasoOroPharynx clear. Neck - supple. Nl Thyroid. Carotids 2+ & No bruits, nodes, JVD Chest - Clear equal BS w/o Rales, rhonchi, wheezes. Cor - Nl HS. RRR w/o sig MGR. PP 1(+). No edema. Abd - No palpable organomegaly, masses or tenderness. BS nl. MS- FROM w/o deformities. Muscle power, tone and bulk Nl. Gait Nl. (+) tender L>R low para-Lumbar tender spasm. No CVAT. Nl Hip ROM bilaterally.  Neuro - No obvious Cr N abnormalities. Sensory, motor and Cerebellar functions appear Nl w/o focal abnormalities. (-) SLR     Assessment & Plan:   1. Acute left-sided low back pain without sciatica  - predniSONE (DELTASONE) 20 MG tablet; 1 tab 3 x day for 3 days, then 1 tab 2 x day for 3 days, then 1 tab 1 x day for 5 days  Dispense: 20 tablet; Refill: 0 - cyclobenzaprine (FLEXERIL) 10 MG tablet; Take 1/2 to 1 tablet 2 to 3 x/daiy as needed for muscle spasm  Dispense: 60 tablet; Refill: 1  - recommended stretching exercises and trial with heating pad.

## 2016-06-12 ENCOUNTER — Other Ambulatory Visit: Payer: Self-pay | Admitting: Internal Medicine

## 2016-11-04 ENCOUNTER — Encounter: Payer: Self-pay | Admitting: Physician Assistant

## 2016-11-25 ENCOUNTER — Encounter: Payer: Self-pay | Admitting: Physician Assistant

## 2016-11-26 NOTE — Progress Notes (Signed)
Complete Physical  Assessment and Plan:  Essential hypertension - CBC with Differential/Platelet - BASIC METABOLIC PANEL WITH GFR - Hepatic function panel - TSH  Hyperlipidemia - Lipid panel   Anemia, unspecified anemia type - Iron and TIBC - Vitamin B12   Depression Continue meds  Anxiety Continue meds  Encounter for general adult medical examination with abnormal findings   Medication management - Magnesium   Vitamin D deficiency - VITAMIN D 25 Hydroxy (Vit-D Deficiency, Fractures)   Screening for blood or protein in urine - Urinalysis, Routine w reflex microscopic (not at [facility]) - Microalbumin / creatinine urine ratio   Lymphadenopathy, submandibular  unchanged from last year, still enlarged,  if any larger, any voice hoarseness, trouble swallowing, etc go to ER or make appointment for follow up   Atrial septal aneurysm   Discussed med's effects and SE's. Screening labs and tests as requested with regular follow-up as recommended.  HPI  56 y.o. female  presents to establish as a new patient, her parents and husband are patients here.   Her blood pressure has been controlled at home, today their BP is BP: 120/76 She does workout. She denies chest pain, shortness of breath, dizziness.  Has mainly been seeing Dr. Philis Pique ( OB/GYN) and UC, getting estrogen.  She is on zoloft for anxiety and on verapamil for palpitations, has seen Dr. Fidela Juneau, had echo with LVH, atrial septal aneursym.   She is not on cholesterol medication and denies myalgias. Her cholesterol is at goal. The cholesterol last visit was:   Lab Results  Component Value Date   CHOL 204 (H) 10/17/2015   HDL 58 10/17/2015   LDLCALC 128 10/17/2015   TRIG 90 10/17/2015   CHOLHDL 3.5 10/17/2015    She has been working on diet and exercise for prediabetes, and denies paresthesia of the feet, polydipsia, polyuria and visual disturbances. Last A1C in the office was:  Lab Results  Component Value Date    HGBA1C 5.5 10/12/2014   Patient is on Vitamin D supplement.   Lab Results  Component Value Date   VD25OH 59 10/17/2015     BMI is Body mass index is 27.75 kg/m., she is working on diet and exercise. Wt Readings from Last 3 Encounters:  11/27/16 179 lb 12.8 oz (81.6 kg)  02/21/16 181 lb 3.2 oz (82.2 kg)  10/17/15 177 lb 3.2 oz (80.4 kg)      Current Medications:  Current Outpatient Prescriptions on File Prior to Visit  Medication Sig Dispense Refill  . Multiple Vitamins-Minerals (HAIR/SKIN/NAILS PO) Take by mouth.    . norethindrone-ethinyl estradiol (FEMHRT 1/5) 1-5 MG-MCG TABS tablet Take 1 tablet by mouth daily.    . sertraline (ZOLOFT) 100 MG tablet     . verapamil (CALAN-SR) 120 MG CR tablet TAKE 1 TABLET BY MOUTH AT BEDTIME 90 tablet 1  . Vitamin D, Cholecalciferol, 1000 UNITS CAPS Take by mouth.     No current facility-administered medications on file prior to visit.     Health Maintenance:   Immunization History  Administered Date(s) Administered  . Influenza-Unspecified 04/20/2015  . Pneumococcal Polysaccharide-23 04/20/2015  . Tdap 04/20/2015   Tetanus: 2016 Pneumovax: 2016 Flu vaccine: 2017 at work Zostavax: due age 69 Prevnar 31 due age 19  No LMP recorded. Patient is not currently having periods (Reason: Other). Pap: yearly with Dr. Philis Pique MGM: yearly 2017 at GYN, dense breast will th 3D  DEXA: N/A Colonoscopy: 11/2014, Dr. Olevia Perches EGD: N/A Last Dental Exam: Dr. Trenton Gammon  Last Eye Exam: recent exam, wears glasses, [facility]  Medical History:  Past Medical History:  Diagnosis Date  . Anxiety   . Degenerative disc disease, cervical   . Depression   . Hypertension   . Migraine    history, has not had in years  . PVC (premature ventricular contraction)    Allergies No Known Allergies  SURGICAL HISTORY She  has a past surgical history that includes Nasal reconstruction (1986). FAMILY HISTORY Her family history includes Arthritis in her  maternal aunt and mother; Cancer in her maternal grandmother; Heart attack in her maternal grandmother; Heart failure in her maternal grandmother; Osteoporosis in her maternal grandmother and paternal grandmother. SOCIAL HISTORY She  reports that she has never smoked. She has never used smokeless tobacco. She reports that she drinks about 8.4 oz of alcohol per week . She reports that she does not use drugs.   Review of Systems: Review of Systems  Constitutional: Positive for malaise/fatigue. Negative for chills, diaphoresis, fever and weight loss.  HENT: Negative for congestion, ear discharge, ear pain, hearing loss, nosebleeds, sore throat and tinnitus.   Eyes: Negative.   Respiratory: Negative for cough, hemoptysis, sputum production, shortness of breath, wheezing and stridor.   Cardiovascular: Positive for palpitations (occ with fatigue). Negative for chest pain, orthopnea, claudication, leg swelling and PND.  Gastrointestinal: Negative.   Genitourinary: Negative.   Musculoskeletal: Negative for back pain, falls, joint pain, myalgias and neck pain.  Skin: Negative.   Neurological: Negative for dizziness, tingling, tremors, sensory change, speech change, focal weakness, seizures, loss of consciousness, weakness and headaches.  Psychiatric/Behavioral: Negative for depression, hallucinations, memory loss, substance abuse and suicidal ideas. The patient is nervous/anxious. The patient does not have insomnia.     Physical Exam: Estimated body mass index is 27.75 kg/m as calculated from the following:   Height as of this encounter: 5' 7.5" (1.715 m).   Weight as of this encounter: 179 lb 12.8 oz (81.6 kg). BP 120/76   Pulse 73   Temp 97.5 F (36.4 C)   Resp 16   Ht 5' 7.5" (1.715 m)   Wt 179 lb 12.8 oz (81.6 kg)   SpO2 98%   BMI 27.75 kg/m  General Appearance: Well nourished, in no apparent distress.  Eyes: PERRLA, EOMs, conjunctiva no swelling or erythema, normal fundi and vessels.   Sinuses: No Frontal/maxillary tenderness  ENT/Mouth: Ext aud canals clear, normal light reflex with TMs without erythema, bulging. Good dentition. No erythema, swelling, or exudate on post pharynx. Tonsils not swollen or erythematous. Hearing normal. + TMJ bilateral Neck: Supple, thyroid nodules. No bruits  Respiratory: Respiratory effort normal, BS equal bilaterally without rales, rhonchi, wheezing or stridor.  Cardio: RRR without murmurs, rubs or gallops. Brisk peripheral pulses without edema.  Chest: symmetric, with normal excursions and percussion.  Breasts: defer  Abdomen: Soft, nontender, no guarding, rebound, hernias, masses, or organomegaly. .  Lymphatics: Non tender bilateral submetal lymphadenopathy, unchanged from last visit.   Genitourinary: defer Musculoskeletal: Full ROM all peripheral extremities,5/5 strength, and normal gait. Skin: Warm, dry without rashes, lesions, ecchymosis. Neuro: Cranial nerves intact, reflexes equal bilaterally. Normal muscle tone, no cerebellar symptoms. Sensation intact.  Psych: Awake and oriented X 3, normal affect, Insight and Judgment appropriate.   EKG: WNL no changes.   Vicie Mutters 3:22 PM [facility]

## 2016-11-27 ENCOUNTER — Encounter: Payer: Self-pay | Admitting: Physician Assistant

## 2016-11-27 ENCOUNTER — Ambulatory Visit (INDEPENDENT_AMBULATORY_CARE_PROVIDER_SITE_OTHER): Payer: 59 | Admitting: Physician Assistant

## 2016-11-27 VITALS — BP 120/76 | HR 73 | Temp 97.5°F | Resp 16 | Ht 67.5 in | Wt 179.8 lb

## 2016-11-27 DIAGNOSIS — F419 Anxiety disorder, unspecified: Secondary | ICD-10-CM

## 2016-11-27 DIAGNOSIS — Z0000 Encounter for general adult medical examination without abnormal findings: Secondary | ICD-10-CM | POA: Diagnosis not present

## 2016-11-27 DIAGNOSIS — Z79899 Other long term (current) drug therapy: Secondary | ICD-10-CM

## 2016-11-27 DIAGNOSIS — I253 Aneurysm of heart: Secondary | ICD-10-CM

## 2016-11-27 DIAGNOSIS — I10 Essential (primary) hypertension: Secondary | ICD-10-CM

## 2016-11-27 DIAGNOSIS — E785 Hyperlipidemia, unspecified: Secondary | ICD-10-CM

## 2016-11-27 DIAGNOSIS — D649 Anemia, unspecified: Secondary | ICD-10-CM

## 2016-11-27 DIAGNOSIS — F3342 Major depressive disorder, recurrent, in full remission: Secondary | ICD-10-CM

## 2016-11-27 DIAGNOSIS — E559 Vitamin D deficiency, unspecified: Secondary | ICD-10-CM

## 2016-11-27 LAB — BASIC METABOLIC PANEL WITH GFR
BUN: 14 mg/dL (ref 7–25)
CALCIUM: 9.4 mg/dL (ref 8.6–10.4)
CO2: 26 mmol/L (ref 20–31)
CREATININE: 0.78 mg/dL (ref 0.50–1.05)
Chloride: 103 mmol/L (ref 98–110)
GFR, Est African American: >89 mL/min (ref >=60)
GFR, Est Non African American: 86 mL/min (ref >=60)
Glucose, Bld: 95 mg/dL (ref 65–99)
Potassium: 4.3 mmol/L (ref 3.5–5.3)
SODIUM: 138 mmol/L (ref 135–146)

## 2016-11-27 LAB — CBC WITH DIFFERENTIAL/PLATELET
BASOS ABS: 0 {cells}/uL (ref 0–200)
Basophils Relative: 0 %
EOS ABS: 158 {cells}/uL (ref 15–500)
Eosinophils Relative: 2 %
HEMATOCRIT: 40.5 % (ref 35.0–45.0)
Hemoglobin: 13.5 g/dL (ref 11.7–15.5)
LYMPHS PCT: 32 %
Lymphs Abs: 2528 cells/uL (ref 850–3900)
MCH: 30.3 pg (ref 27.0–33.0)
MCHC: 33.3 g/dL (ref 32.0–36.0)
MCV: 90.8 fL (ref 80.0–100.0)
MONOS PCT: 9 %
MPV: 8.8 fL (ref 7.5–12.5)
Monocytes Absolute: 711 cells/uL (ref 200–950)
Neutro Abs: 4503 cells/uL (ref 1500–7800)
Neutrophils Relative %: 57 %
PLATELETS: 287 10*3/uL (ref 140–400)
RBC: 4.46 MIL/uL (ref 3.80–5.10)
RDW: 13.1 % (ref 11.0–15.0)
WBC: 7.9 10*3/uL (ref 3.8–10.8)

## 2016-11-27 LAB — IRON AND TIBC
%SAT: 20 % (ref 11–50)
IRON: 75 ug/dL (ref 45–160)
TIBC: 382 ug/dL (ref 250–450)
UIBC: 307 ug/dL (ref 125–400)

## 2016-11-27 LAB — LIPID PANEL
CHOL/HDL RATIO: 4.1 ratio (ref <5.0)
CHOLESTEROL: 212 mg/dL — AB (ref <200)
HDL: 52 mg/dL (ref >50)
LDL Cholesterol: 133 mg/dL — ABNORMAL HIGH (ref <100)
Triglycerides: 136 mg/dL (ref <150)
VLDL: 27 mg/dL (ref <30)

## 2016-11-27 LAB — HEPATIC FUNCTION PANEL
ALBUMIN: 4.2 g/dL (ref 3.6–5.1)
ALT: 16 U/L (ref 6–29)
AST: 19 U/L (ref 10–35)
Alkaline Phosphatase: 67 U/L (ref 33–130)
BILIRUBIN DIRECT: 0.1 mg/dL (ref <=0.2)
BILIRUBIN TOTAL: 0.5 mg/dL (ref 0.2–1.2)
Indirect Bilirubin: 0.4 mg/dL (ref 0.2–1.2)
Total Protein: 7 g/dL (ref 6.1–8.1)

## 2016-11-27 NOTE — Patient Instructions (Addendum)
Add ENTERIC COATED low dose 81 mg Aspirin daily OR can do every other day if you have easy bruising to protect your heart and head. As well as to reduce risk of Colon Cancer by 20 %, Skin Cancer by 26 % , Melanoma by 46% and Pancreatic cancer by 60%  Do self breast exams     Simple math prevails.    1st - exercise does not produce significant weight loss - at best one converts fat into muscle , "bulks up", loses inches, but usually stays "weight neutral"     2nd - think of your body weightas a check book: If you eat more calories than you burn up - you save money or gain weight .... Or if you spend more money than you put in the check book, ie burn up more calories than you eat, then you lose weight     3rd - if you walk or run 1 mile, you burn up 100 calories - you have to burn up 3,500 calories to lose 1 pound, ie you have to walk/run 35 miles to lose 1 measly pound. So if you want to lose 10 #, then you have to walk/run 350 miles, so.... clearly exercise is not the solution.     4. So if you consume 1,500 calories, then you have to burn up the equivalent of 15 miles to stay weight neutral - It also stands to reason that if you consume 1,500 cal/day and don't lose weight, then you must be burning up about 1,500 cals/day to stay weight neutral.     5. If you really want to lose weight, you must cut your calorie intake 300 calories /day and at that rate you should lose about 1 # every 3 days.   6. Please purchase Dr Fara Olden Fuhrman's book(s) "The End of Dieting" & "Eat to Live" . It has some great concepts and recipes.

## 2016-11-28 ENCOUNTER — Encounter: Payer: Self-pay | Admitting: Physician Assistant

## 2016-11-28 LAB — MICROALBUMIN / CREATININE URINE RATIO: Creatinine, Urine: 29 mg/dL (ref 20–320)

## 2016-11-28 LAB — URINALYSIS, MICROSCOPIC ONLY
Bacteria, UA: NONE SEEN [HPF]
Casts: NONE SEEN [LPF]
Crystals: NONE SEEN [HPF]
RBC / HPF: NONE SEEN RBC/HPF (ref <=2)
Squamous Epithelial / LPF: NONE SEEN [HPF] (ref <=5)
Yeast: NONE SEEN [HPF]

## 2016-11-28 LAB — TSH: TSH: 1.43 m[IU]/L

## 2016-11-28 LAB — URINALYSIS, ROUTINE W REFLEX MICROSCOPIC
Bilirubin Urine: NEGATIVE
GLUCOSE, UA: NEGATIVE
Ketones, ur: NEGATIVE
NITRITE: NEGATIVE
PROTEIN: NEGATIVE
Specific Gravity, Urine: 1.006 (ref 1.001–1.035)
pH: 5.5 (ref 5.0–8.0)

## 2016-11-28 LAB — VITAMIN D 25 HYDROXY (VIT D DEFICIENCY, FRACTURES): VIT D 25 HYDROXY: 68 ng/mL (ref 30–100)

## 2016-11-28 LAB — MAGNESIUM: Magnesium: 2 mg/dL (ref 1.5–2.5)

## 2016-11-28 LAB — VITAMIN B12: Vitamin B-12: 349 pg/mL (ref 200–1100)

## 2016-12-21 ENCOUNTER — Other Ambulatory Visit: Payer: Self-pay | Admitting: Internal Medicine

## 2017-02-27 ENCOUNTER — Other Ambulatory Visit: Payer: Self-pay

## 2017-02-27 MED ORDER — VERAPAMIL HCL ER 120 MG PO TBCR: 120.0000 mg | EXTENDED_RELEASE_TABLET | Freq: Every day | ORAL | 0 refills | Status: DC

## 2017-03-08 IMAGING — US US SOFT TISSUE HEAD/NECK
1 series · 13 of 25 positions shown · non-contrast
Comparison: None.

CLINICAL DATA: 54-year-old female with thyroid nodule and
lymphadenopathy on physical examination

EXAM:
THYROID ULTRASOUND
TECHNIQUE: Ultrasound examination of the thyroid gland and adjacent soft
tissues was performed.

[Series 1: us soft tissue head/neck · 0.07mm/px · 13 of 61 slices shown]
[im 1/61]
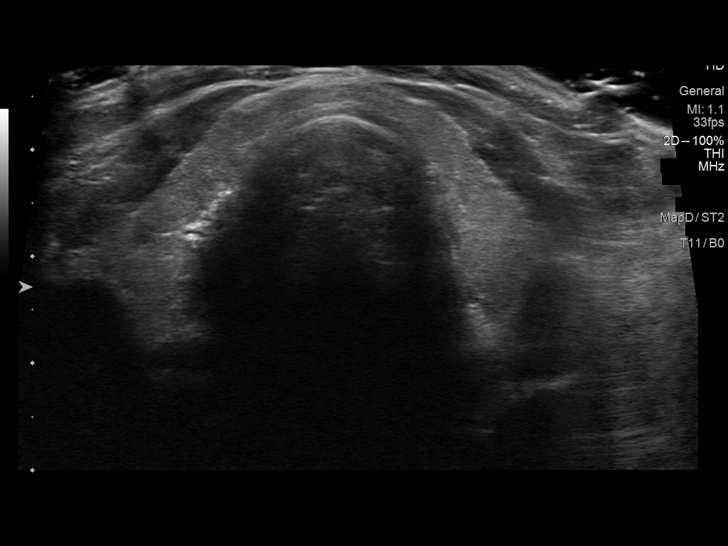
[im 6/61]
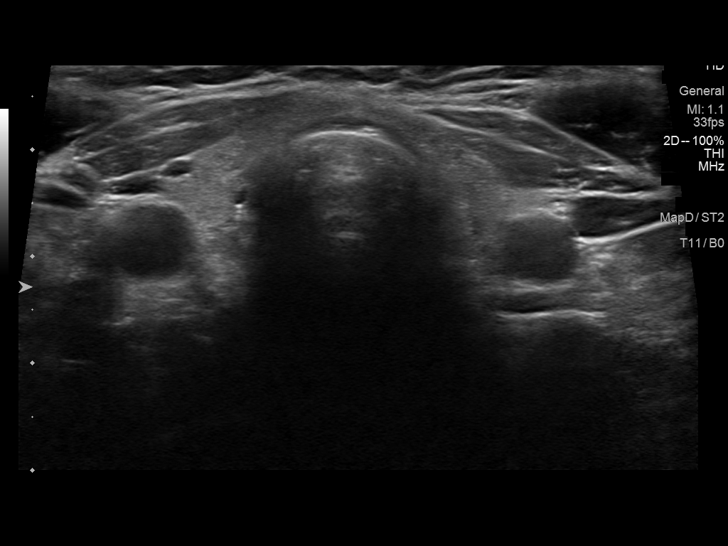
[im 11/61]
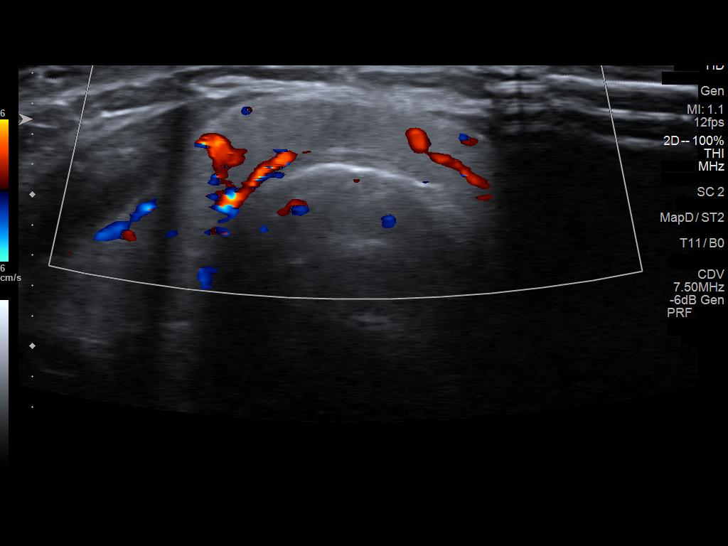
[im 16/61]
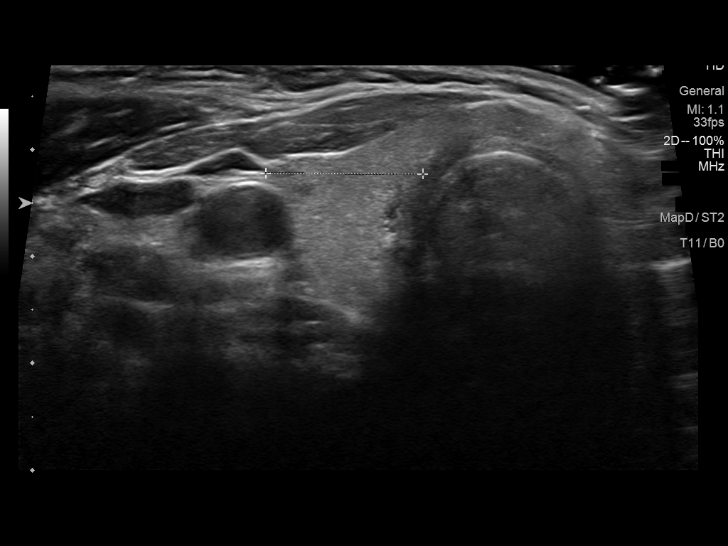
[im 21/61]
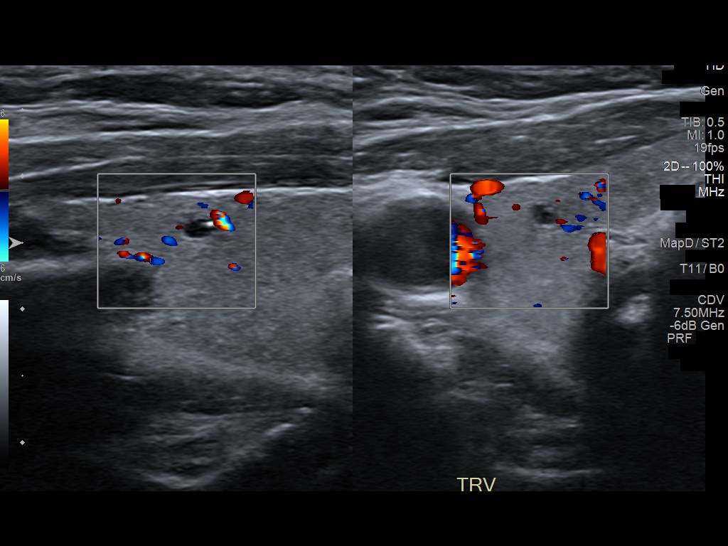
[im 26/61]
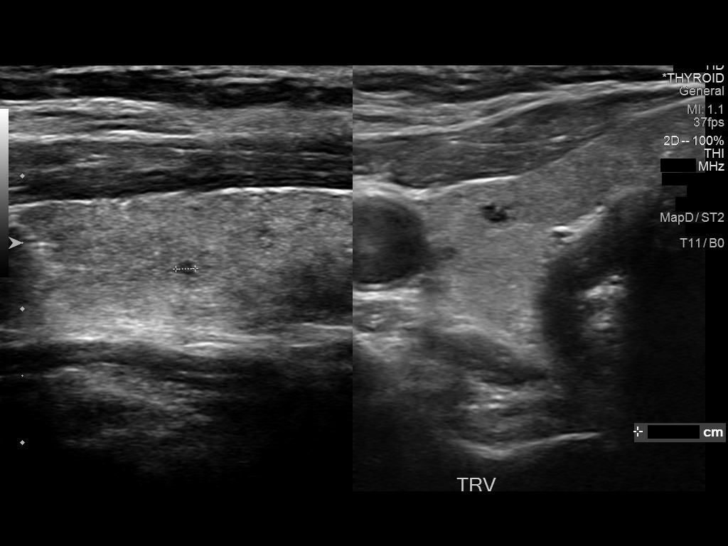
[im 31/61]
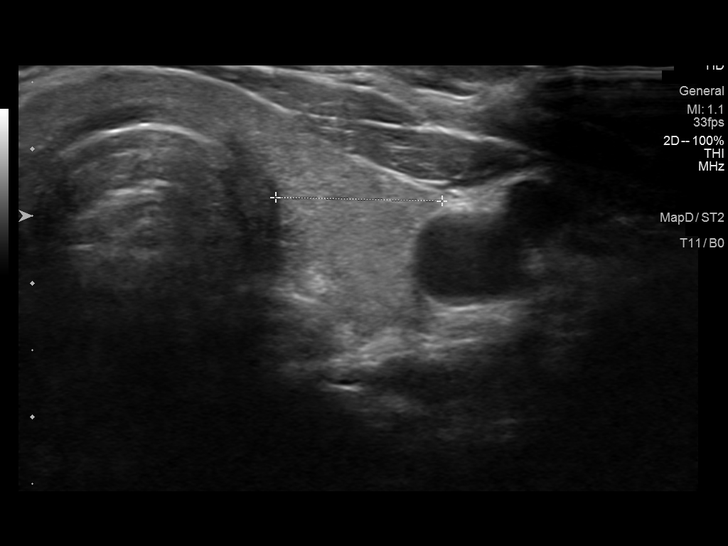
[im 36/61]
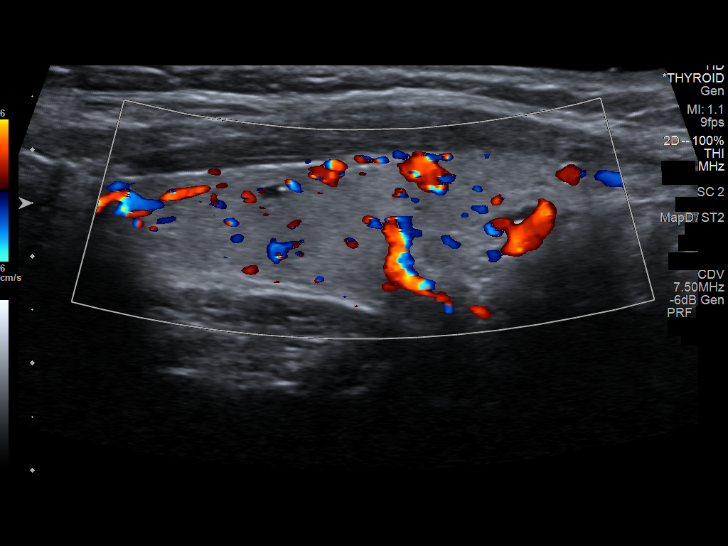
[im 41/61]
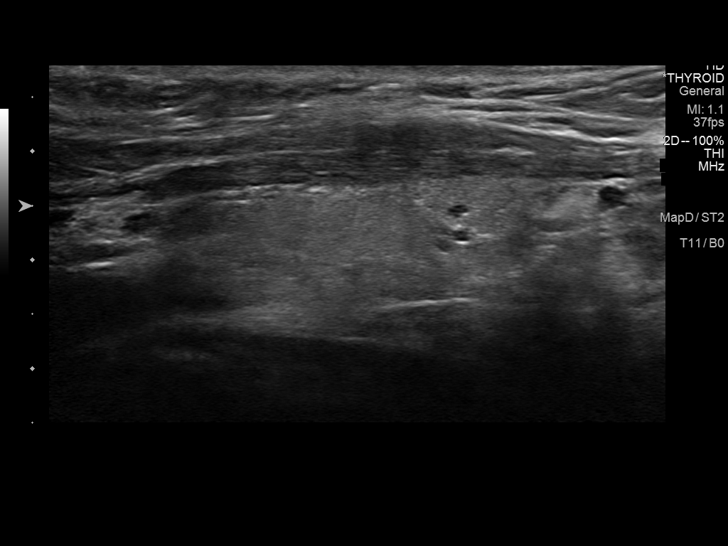
[im 46/61]
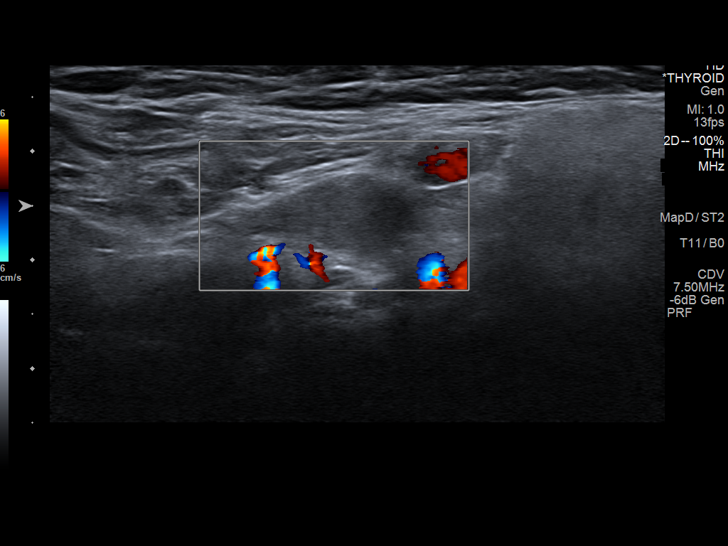
[im 51/61]
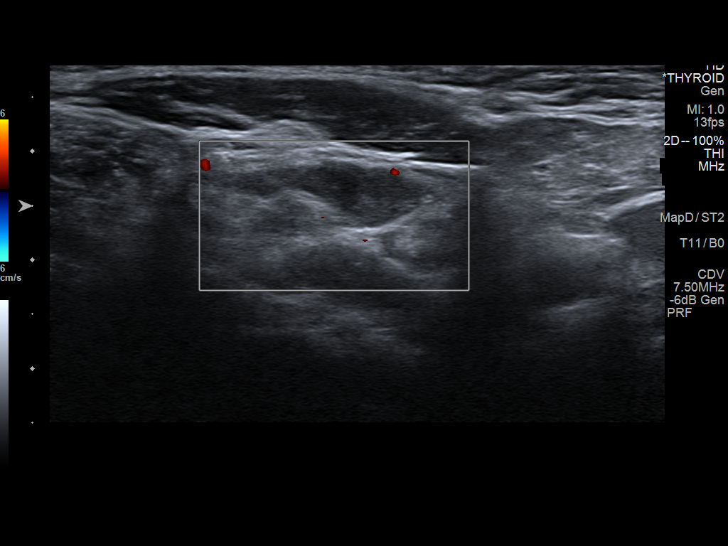
[im 56/61]
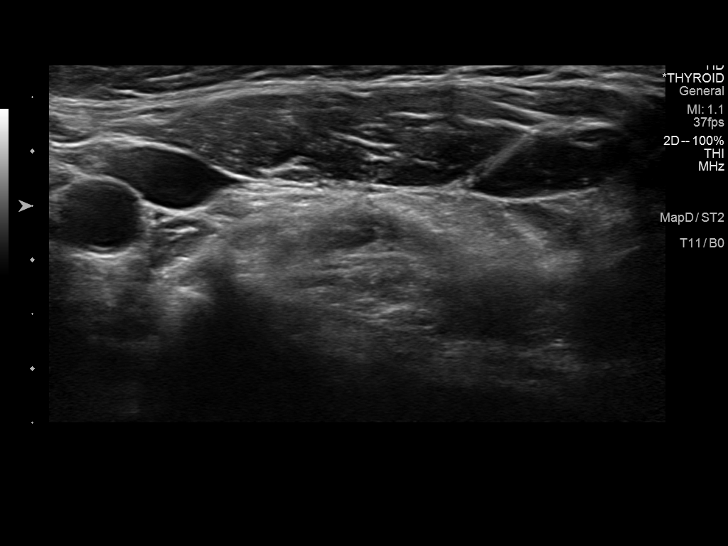
[im 61/61]
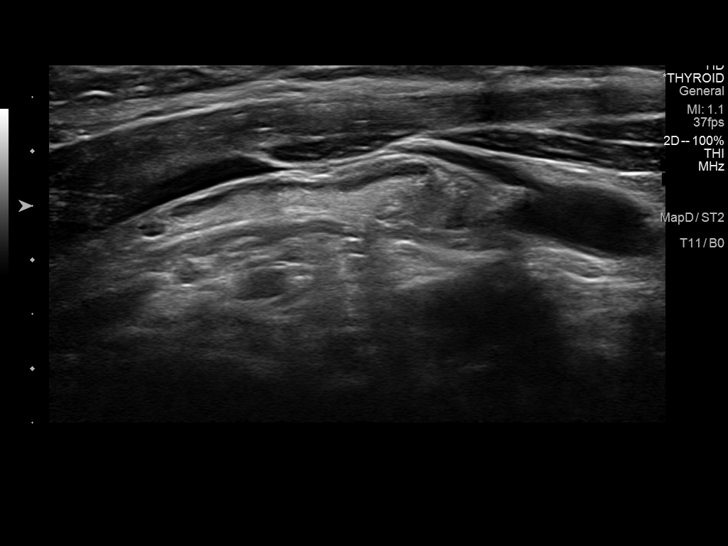

[13 of 25 positions shown; findings below may reference images not displayed]

FINDINGS: Right thyroid lobe

Measurements: 4.5 x 1.0 x 1.5 cm. Several tiny 2 mm benign appearing
thyroid cysts and hypoechoic nodules are noted in the upper to mid
gland. These findings are likely incidental and would not be
palpable on physical exam.

Left thyroid lobe

Measurements: 4.1 x 1.3 x 1.2 cm.  No nodules visualized.

Isthmus

Thickness: 0.2 cm.  No nodules visualized.

Lymphadenopathy

Prominent right submandibular lymph node measures up to 8 mm in
short axis which is slightly enlarged. The echogenic fatty hilum
remains preserved. Mildly prominent left submandibular lymph node
measures up to 6 mm in short axis which is within normal limits.
Additional borderline enlarged lymph nodes are present along the
right cervical chain. Fatty hila are preserved on all nodes.
IMPRESSION: 1. Essentially normal sonographic appearance of the thyroid gland.
There are a few tiny benign-appearing 2 mm thyroid cysts/nodules in
the upper to mid right gland which are likely incidental.
2. Prominent bilateral submandibular and right cervical chain lymph
nodes. The only node which is technically enlarged is at the right
submandibular station. Consider further evaluation with CT scan of
the neck with contrast.

## 2017-04-23 DIAGNOSIS — Z23 Encounter for immunization: Secondary | ICD-10-CM | POA: Diagnosis not present

## 2017-06-03 DIAGNOSIS — Z1231 Encounter for screening mammogram for malignant neoplasm of breast: Secondary | ICD-10-CM | POA: Diagnosis not present

## 2017-06-03 DIAGNOSIS — Z124 Encounter for screening for malignant neoplasm of cervix: Secondary | ICD-10-CM | POA: Diagnosis not present

## 2017-06-03 DIAGNOSIS — Z01419 Encounter for gynecological examination (general) (routine) without abnormal findings: Secondary | ICD-10-CM | POA: Diagnosis not present

## 2017-06-03 LAB — HM MAMMOGRAPHY

## 2017-06-03 LAB — HM PAP SMEAR: HM PAP: NORMAL

## 2017-07-03 ENCOUNTER — Other Ambulatory Visit: Payer: Self-pay | Admitting: Physician Assistant

## 2017-08-28 DIAGNOSIS — Z23 Encounter for immunization: Secondary | ICD-10-CM | POA: Diagnosis not present

## 2017-12-01 ENCOUNTER — Encounter: Payer: Self-pay | Admitting: Physician Assistant

## 2017-12-02 DIAGNOSIS — E669 Obesity, unspecified: Secondary | ICD-10-CM | POA: Insufficient documentation

## 2017-12-02 DIAGNOSIS — E663 Overweight: Secondary | ICD-10-CM | POA: Insufficient documentation

## 2017-12-02 NOTE — Progress Notes (Signed)
Complete Physical  Assessment and Plan:  Encounter for routine adult medical exam with abnormal findings  Essential hypertension Continue medication Monitor blood pressure at home; call if consistently over 130/80 Continue DASH diet.   Reminder to go to the ER if any CP, SOB, nausea, dizziness, severe HA, changes vision/speech, left arm numbness and tingling and jaw pain. -     EKG 12-Lead -     Magnesium  Atrial septal aneurysm Has been worked up by Dr. Wynonia Lawman; monitor; follow up cardiology as needed  Vitamin D deficiency Continue supplementation Check vitamin D level -     VITAMIN D 25 Hydroxy (Vit-D Deficiency, Fractures)  Medication management -     CBC with Differential/Platelet -     COMPLETE METABOLIC PANEL WITH GFR -     Urinalysis w microscopic + reflex cultur -     Magnesium  Hyperlipidemia, unspecified hyperlipidemia type Continue fiber supplement Continue low cholesterol diet and exercise.  Check lipid panel.  -     Lipid panel -     TSH  Recurrent major depressive disorder, in full remission (HCC)/ Anxiety Controlled; Continue medications  Lifestyle discussed: diet/exerise, sleep hygiene, stress management, hydration  Anemia, unspecified type -     CBC with Differential/Platelet -     Vitamin B12  Screening for diabetes mellitus -     Hemoglobin A1c  Screening for hematuria or proteinuria -     Urinalysis w microscopic + reflex cultur -     Microalbumin / creatinine urine ratio  Overweight (BMI 25.0-29.9) Long discussion about weight loss, diet, and exercise Recommended diet heavy in fruits and veggies and low in animal meats, cheeses, and dairy products, appropriate calorie intake Discussed appropriate weight for height Follow up at next visit   Discussed med's effects and SE's. Screening labs and tests as requested with regular follow-up as recommended. Over 40 minutes of exam, counseling, chart review, and complex, high level critical decision  making was performed this visit.   Future Appointments  Date Time Provider [place]  12/07/2018  3:00 PM Liane Comber, NP [facility] None     HPI  57 y.o. female  presents for a complete physical and follow up for has Hypertension; Depression; Anxiety; Absolute anemia; Hyperlipidemia; Atrial septal aneurysm; Medication management; Vitamin D deficiency; and Overweight (BMI 25.0-29.9) on their problem list. Has mainly been seeing Dr. Philis Pique (OB/GYN) and UC, getting estrogen. She is on zoloft for anxiety and on verapamil for palpitations, has seen Dr. Fidela Juneau, had echo with LVH, atrial septal aneursym.   BMI is Body mass index is 27.92 kg/m., she has been working on diet and exercise. Wt Readings from Last 3 Encounters:  12/03/17 179 lb 9.6 oz (81.5 kg)  11/27/16 179 lb 12.8 oz (81.6 kg)  02/21/16 181 lb 3.2 oz (82.2 kg)  Breakfast: 2 eggs, and whole wheat toast Lunch: 1/2 sandwich and single serve portion of chips, 2 cookies Snack: carrots, string cheese, sunflower seeds Dinner: chicken with vegetables, will do whole wheat pasta if having, does zucchini noodles, etc Watches sugar and carbs closely Fluids: decaf tea with 4 tables of sugar for whole pitcher - has tea with stevia at work, limits soda intake, typically diet soda at a movie   She does not check BP, today their BP is BP: 132/74 She does workout. She denies chest pain, shortness of breath, dizziness.   She is not on cholesterol medication (is on fiber supplement). Her cholesterol is not at goal. The cholesterol  last visit was:   Lab Results  Component Value Date   CHOL 212 (H) 11/27/2016   HDL 52 11/27/2016   LDLCALC 133 (H) 11/27/2016   TRIG 136 11/27/2016   CHOLHDL 4.1 11/27/2016    Last A1C in the office was:  Lab Results  Component Value Date   HGBA1C 5.5 10/12/2014   Last GFR: Lab Results  Component Value Date   GFRNONAA 86 11/27/2016   Patient is on Vitamin D supplement.   Lab Results  Component  Value Date   VD25OH 68 11/27/2016      Current Medications:  Current Outpatient Medications on File Prior to Visit  Medication Sig Dispense Refill  . Biotin 10000 MCG TABS Take by mouth.    . Cyanocobalamin (VITAMIN B-12 PO) Take by mouth.    . norethindrone-ethinyl estradiol (FEMHRT 1/5) 1-5 MG-MCG TABS tablet Take 1 tablet by mouth daily.    . Omega-3 Fatty Acids (FISH OIL PO) Take by mouth.    . sertraline (ZOLOFT) 100 MG tablet     . verapamil (CALAN-SR) 120 MG CR tablet TAKE 1 TABLET BY MOUTH AT BEDTIME 90 tablet 1  . Vitamin D, Cholecalciferol, 1000 UNITS CAPS Take by mouth.    . Multiple Vitamins-Minerals (HAIR/SKIN/NAILS PO) Take by mouth.     No current facility-administered medications on file prior to visit.    Allergies:  No Known Allergies Medical History:  She has Hypertension; Depression; Anxiety; Absolute anemia; Hyperlipidemia; Atrial septal aneurysm; Medication management; Vitamin D deficiency; and Overweight (BMI 25.0-29.9) on their problem list. Health Maintenance:   Immunization History  Administered Date(s) Administered  . Influenza-Unspecified 04/20/2015, 04/23/2017  . Pneumococcal Polysaccharide-23 04/20/2015  . Tdap 04/20/2015  . Zoster Recombinat (Shingrix) 04/23/2017, 08/28/2017   Tetanus: 2016  Pneumovax: 2016 Flu vaccine: 2018  Shingrix: 2019 2/2 Prevnar 13 due age 67  No LMP recorded. Patient is not currently having periods (Reason: Other). Pap: yearly with Dr. Philis Pique MGM: 2018, gets annually at [facility]: N/A Colonoscopy: 11/2014, Dr. Olevia Perches, due 2021 EGD: N/A  Last Dental Exam: Dr. Trenton Gammon, q 6 months, last 2018 Last Eye Exam: wears [facility], last exam 06/2017  Patient Care Team: Unk Pinto, MD as PCP - General (Internal Medicine)  Surgical History:  She has a past surgical history that includes Nasal reconstruction (1986). Family History:  Herfamily history includes Arthritis in her maternal grandfather and  mother; Cancer in her maternal grandmother; Congestive Heart Failure in her maternal grandmother; Heart attack in her maternal grandmother; Hyperlipidemia in her father; Osteoporosis in her maternal grandmother and paternal grandmother; Vascular Disease in her paternal grandfather. Social History:  She reports that she has never smoked. She has never used smokeless tobacco. She reports that she drinks about 8.4 oz of alcohol per week. She reports that she does not use drugs.  Review of Systems: Review of Systems  Constitutional: Negative for malaise/fatigue and weight loss.  HENT: Negative for hearing loss and tinnitus.   Eyes: Negative for blurred vision and double vision.  Respiratory: Negative for cough, sputum production, shortness of breath and wheezing.   Cardiovascular: Negative for chest pain, palpitations, orthopnea, claudication, leg swelling and PND.       Occasional skipped beat  Gastrointestinal: Negative for abdominal pain, blood in stool, constipation, diarrhea, heartburn, melena, nausea and vomiting.  Genitourinary: Negative.   Musculoskeletal: Negative for falls, joint pain and myalgias.  Skin: Negative for rash.  Neurological: Negative for dizziness, tingling, sensory change, weakness and  headaches.  Endo/Heme/Allergies: Negative for polydipsia.  Psychiatric/Behavioral: Negative.  Negative for depression, memory loss, substance abuse and suicidal ideas. The patient is not nervous/anxious and does not have insomnia.   All other systems reviewed and are negative.   Physical Exam: Estimated body mass index is 27.92 kg/m as calculated from the following:   Height as of this encounter: 5' 7.25" (1.708 m).   Weight as of this encounter: 179 lb 9.6 oz (81.5 kg). BP 132/74   Pulse 77   Temp (!) 97.3 F (36.3 C)   Ht 5' 7.25" (1.708 m)   Wt 179 lb 9.6 oz (81.5 kg)   SpO2 99%   BMI 27.92 kg/m  General Appearance: Well nourished, in no apparent distress.  Eyes: PERRLA,  EOMs, conjunctiva no swelling or erythema, normal fundi and vessels.  Sinuses: No Frontal/maxillary tenderness  ENT/Mouth: Ext aud canals clear, normal light reflex with TMs without erythema, bulging. Good dentition. No erythema, swelling, or exudate on post pharynx. Tonsils not swollen or erythematous. Hearing normal.  Neck: Supple, thyroid normal. No bruits  Respiratory: Respiratory effort normal, BS equal bilaterally without rales, rhonchi, wheezing or stridor.  Cardio: RRR without murmurs, rubs or gallops. Brisk peripheral pulses without edema.  Chest: symmetric, with normal excursions and percussion.  Breasts: Defer to GYN Abdomen: Soft, nontender, no guarding, rebound, hernias, masses, or organomegaly.  Lymphatics: Non tender without lymphadenopathy.  Genitourinary: Defer to GYN Musculoskeletal: Full ROM all peripheral extremities,5/5 strength, and normal gait.  Skin: Warm, dry without rashes, lesions, ecchymosis. Neuro: Cranial nerves intact, reflexes equal bilaterally. Normal muscle tone, no cerebellar symptoms. Sensation intact.  Psych: Awake and oriented X 3, normal affect, Insight and Judgment appropriate.   EKG: WNL no ST changes.  Gorden Harms Adylene Dlugosz 3:24 PM [facility]

## 2017-12-03 ENCOUNTER — Ambulatory Visit (INDEPENDENT_AMBULATORY_CARE_PROVIDER_SITE_OTHER): Payer: 59 | Admitting: Adult Health

## 2017-12-03 ENCOUNTER — Encounter: Payer: Self-pay | Admitting: Adult Health

## 2017-12-03 VITALS — BP 132/74 | HR 77 | Temp 97.3°F | Ht 67.25 in | Wt 179.6 lb

## 2017-12-03 DIAGNOSIS — E785 Hyperlipidemia, unspecified: Secondary | ICD-10-CM | POA: Diagnosis not present

## 2017-12-03 DIAGNOSIS — Z1389 Encounter for screening for other disorder: Secondary | ICD-10-CM

## 2017-12-03 DIAGNOSIS — E663 Overweight: Secondary | ICD-10-CM

## 2017-12-03 DIAGNOSIS — D649 Anemia, unspecified: Secondary | ICD-10-CM

## 2017-12-03 DIAGNOSIS — Z79899 Other long term (current) drug therapy: Secondary | ICD-10-CM

## 2017-12-03 DIAGNOSIS — Z0000 Encounter for general adult medical examination without abnormal findings: Secondary | ICD-10-CM

## 2017-12-03 DIAGNOSIS — Z136 Encounter for screening for cardiovascular disorders: Secondary | ICD-10-CM | POA: Diagnosis not present

## 2017-12-03 DIAGNOSIS — Z0001 Encounter for general adult medical examination with abnormal findings: Secondary | ICD-10-CM

## 2017-12-03 DIAGNOSIS — F3342 Major depressive disorder, recurrent, in full remission: Secondary | ICD-10-CM

## 2017-12-03 DIAGNOSIS — F419 Anxiety disorder, unspecified: Secondary | ICD-10-CM

## 2017-12-03 DIAGNOSIS — Z131 Encounter for screening for diabetes mellitus: Secondary | ICD-10-CM

## 2017-12-03 DIAGNOSIS — I10 Essential (primary) hypertension: Secondary | ICD-10-CM | POA: Diagnosis not present

## 2017-12-03 DIAGNOSIS — E559 Vitamin D deficiency, unspecified: Secondary | ICD-10-CM

## 2017-12-03 DIAGNOSIS — I253 Aneurysm of heart: Secondary | ICD-10-CM

## 2017-12-03 NOTE — Patient Instructions (Signed)
Aim for 7+ servings of fruits and vegetables daily  80+ fluid ounces of water or unsweet tea for healthy kidneys  1 drink of alcohol per day  Limit animal fats in diet for cholesterol and heart health - choose grass fed whenever available  Aim for low stress - take time to unwind and care for your mental health  Aim for 150 min of moderate intensity exercise weekly for heart health, and weights twice weekly for bone health  Aim for 7-9 hours of sleep daily      When it comes to diets, agreement about the perfect plan isn't easy to find, even among the experts. Experts at the [employer] developed an idea known as the Healthy Eating Plate. Just imagine a plate divided into logical, healthy portions.  The emphasis is on diet quality:  Load up on vegetables and fruits - one-half of your plate: Aim for color and variety, and remember that potatoes don't count.  Go for whole grains - one-quarter of your plate: Whole wheat, barley, wheat berries, quinoa, oats, brown rice, and foods made with them. If you want pasta, go with whole wheat pasta.  Protein power - one-quarter of your plate: Fish, chicken, beans, and nuts are all healthy, versatile protein sources. Limit red meat.  The diet, however, does go beyond the plate, offering a few other suggestions.  Use healthy plant oils, such as olive, canola, soy, corn, sunflower and peanut. Check the labels, and avoid partially hydrogenated oil, which have unhealthy trans fats.  If you're thirsty, drink water. Coffee and tea are good in moderation, but skip sugary drinks and limit milk and dairy products to one or two daily servings.  The type of carbohydrate in the diet is more important than the amount. Some sources of carbohydrates, such as vegetables, fruits, whole grains, and beans-are healthier than others.  Finally, stay active.   Intermittent fasting is more about strategy than starvation. It's meant to reset  your body in different ways, hopefully with fitness and nutrition changes as a result.  Like any big switchover, though, results may vary when it comes down to the individual level. What works for your friends may not work for you, or vice versa. That's why it's helpful to play around with variations on intermittent fasting and healthy habits and find what works best for you.  WHAT IS INTERMITTENT FASTING AND WHY DO IT?  Intermittent fasting doesn't involve specific foods, but rather, a strict schedule regarding when you eat. Also called "time-restricted eating," the tactic has been praised for its contribution to weight loss, improved body composition, and decreased cravings. Preliminary research also suggests it may be beneficial for glucose tolerance, hormone regulation, better muscle mass and lower body fat.  Part of its appeal is the simplicity of the effort. Unlike some other trends, there's no calculations to intermittent fasting.  You simply eat within a certain block of time, usually a window of 8-10 hours. In the other big block of time - about 14-16 hours, including when you're asleep - you don't eat anything, not even snacks. You can drink water, coffee, tea or any other beverage that doesn't have calories.  For example, if you like having a late dinner, you might skip breakfast and have your first meal at noon and your last meal of the day at 8 p.m., and then not eat until noon again the next day.  IDEAS FOR GETTING STARTED  If you're new to the strategy,  it may be helpful to eat within the typical circadian rhythm and keep eating within daylight hours. This can be especially beneficial if you're looking at intermittent fasting for weight-loss goals.  So first try only eating between 12pm to 8pm.  Outside of this time you may have water, black coffee, and hot tea. You may not eat it drink anything that has carbs, sugars, OR artificial sugars like diet soda.   Like any major eating  and fitness shift, it can take time to find the perfect fit, so don't be afraid to experiment with different options - including ditching intermittent fasting altogether if it's simply not for you. But if it is, you may be surprised by some of the benefits that come along with the strategy.

## 2017-12-04 ENCOUNTER — Encounter: Payer: Self-pay | Admitting: Adult Health

## 2017-12-04 LAB — LIPID PANEL
Cholesterol: 202 mg/dL — ABNORMAL HIGH (ref <200)
HDL: 54 mg/dL (ref >50)
LDL Cholesterol (Calc): 119 mg/dL (calc) — ABNORMAL HIGH
NON-HDL CHOLESTEROL (CALC): 148 mg/dL — AB (ref <130)
Total CHOL/HDL Ratio: 3.7 (calc) (ref <5.0)
Triglycerides: 172 mg/dL — ABNORMAL HIGH (ref <150)

## 2017-12-04 LAB — URINALYSIS W MICROSCOPIC + REFLEX CULTURE
BILIRUBIN URINE: NEGATIVE
Bacteria, UA: NONE SEEN /HPF
Glucose, UA: NEGATIVE
Hgb urine dipstick: NEGATIVE
Hyaline Cast: NONE SEEN /LPF
Ketones, ur: NEGATIVE
Leukocyte Esterase: NEGATIVE
NITRITES URINE, INITIAL: NEGATIVE
Protein, ur: NEGATIVE
RBC / HPF: NONE SEEN /HPF (ref 0–2)
SPECIFIC GRAVITY, URINE: 1.029 (ref 1.001–1.03)
SQUAMOUS EPITHELIAL / LPF: NONE SEEN /HPF (ref <=5)
WBC, UA: NONE SEEN /HPF (ref 0–5)
pH: 6 (ref 5.0–8.0)

## 2017-12-04 LAB — CBC WITH DIFFERENTIAL/PLATELET
BASOS ABS: 58 {cells}/uL (ref 0–200)
Basophils Relative: 0.8 %
EOS ABS: 108 {cells}/uL (ref 15–500)
Eosinophils Relative: 1.5 %
HEMATOCRIT: 37.5 % (ref 35.0–45.0)
HEMOGLOBIN: 12.8 g/dL (ref 11.7–15.5)
Lymphs Abs: 2542 cells/uL (ref 850–3900)
MCH: 29.7 pg (ref 27.0–33.0)
MCHC: 34.1 g/dL (ref 32.0–36.0)
MCV: 87 fL (ref 80.0–100.0)
MONOS PCT: 10.2 %
MPV: 9.4 fL (ref 7.5–12.5)
NEUTROS ABS: 3758 {cells}/uL (ref 1500–7800)
Neutrophils Relative %: 52.2 %
Platelets: 312 10*3/uL (ref 140–400)
RBC: 4.31 10*6/uL (ref 3.80–5.10)
RDW: 12.6 % (ref 11.0–15.0)
Total Lymphocyte: 35.3 %
WBC mixed population: 734 cells/uL (ref 200–950)
WBC: 7.2 10*3/uL (ref 3.8–10.8)

## 2017-12-04 LAB — COMPLETE METABOLIC PANEL WITH GFR
AG RATIO: 1.8 (calc) (ref 1.0–2.5)
ALT: 10 U/L (ref 6–29)
AST: 15 U/L (ref 10–35)
Albumin: 4.3 g/dL (ref 3.6–5.1)
Alkaline phosphatase (APISO): 72 U/L (ref 33–130)
BILIRUBIN TOTAL: 0.6 mg/dL (ref 0.2–1.2)
BUN: 14 mg/dL (ref 7–25)
CALCIUM: 9.4 mg/dL (ref 8.6–10.4)
CO2: 27 mmol/L (ref 20–32)
Chloride: 103 mmol/L (ref 98–110)
Creat: 0.72 mg/dL (ref 0.50–1.05)
GFR, EST AFRICAN AMERICAN: 109 mL/min/{1.73_m2} (ref >=60)
GFR, EST NON AFRICAN AMERICAN: 94 mL/min/{1.73_m2} (ref >=60)
GLOBULIN: 2.4 g/dL (ref 1.9–3.7)
Glucose, Bld: 90 mg/dL (ref 65–99)
Potassium: 4.2 mmol/L (ref 3.5–5.3)
SODIUM: 138 mmol/L (ref 135–146)
Total Protein: 6.7 g/dL (ref 6.1–8.1)

## 2017-12-04 LAB — HEMOGLOBIN A1C
EAG (MMOL/L): 5.8 (calc)
HEMOGLOBIN A1C: 5.3 %{Hb} (ref <5.7)
Mean Plasma Glucose: 105 (calc)

## 2017-12-04 LAB — TSH: TSH: 1.58 mIU/L

## 2017-12-04 LAB — MAGNESIUM: Magnesium: 2.1 mg/dL (ref 1.5–2.5)

## 2017-12-04 LAB — VITAMIN B12: VITAMIN B 12: 394 pg/mL (ref 200–1100)

## 2017-12-04 LAB — VITAMIN D 25 HYDROXY (VIT D DEFICIENCY, FRACTURES): Vit D, 25-Hydroxy: 51 ng/mL (ref 30–100)

## 2017-12-04 LAB — MICROALBUMIN / CREATININE URINE RATIO
CREATININE, URINE: 143 mg/dL (ref 20–275)
MICROALB UR: 0.4 mg/dL
Microalb Creat Ratio: 3 mcg/mg creat (ref <30)

## 2017-12-04 LAB — NO CULTURE INDICATED

## 2017-12-05 ENCOUNTER — Encounter: Payer: Self-pay | Admitting: Adult Health

## 2018-01-15 ENCOUNTER — Other Ambulatory Visit: Payer: Self-pay | Admitting: Internal Medicine

## 2018-04-16 DIAGNOSIS — Z23 Encounter for immunization: Secondary | ICD-10-CM | POA: Diagnosis not present

## 2018-06-09 DIAGNOSIS — Z1231 Encounter for screening mammogram for malignant neoplasm of breast: Secondary | ICD-10-CM | POA: Diagnosis not present

## 2018-06-09 DIAGNOSIS — Z01419 Encounter for gynecological examination (general) (routine) without abnormal findings: Secondary | ICD-10-CM | POA: Diagnosis not present

## 2018-08-03 ENCOUNTER — Other Ambulatory Visit: Payer: Self-pay | Admitting: Internal Medicine

## 2018-10-05 DIAGNOSIS — N8110 Cystocele, unspecified: Secondary | ICD-10-CM | POA: Diagnosis not present

## 2018-10-05 DIAGNOSIS — N393 Stress incontinence (female) (male): Secondary | ICD-10-CM | POA: Diagnosis not present

## 2018-10-13 DIAGNOSIS — N8110 Cystocele, unspecified: Secondary | ICD-10-CM | POA: Diagnosis not present

## 2018-10-13 DIAGNOSIS — N393 Stress incontinence (female) (male): Secondary | ICD-10-CM | POA: Diagnosis not present

## 2018-12-06 ENCOUNTER — Encounter: Payer: Self-pay | Admitting: Adult Health
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
## 2018-12-07 ENCOUNTER — Ambulatory Visit (INDEPENDENT_AMBULATORY_CARE_PROVIDER_SITE_OTHER): Payer: 59 | Admitting: Adult Health

## 2018-12-07 ENCOUNTER — Other Ambulatory Visit: Payer: Self-pay

## 2018-12-07 ENCOUNTER — Encounter: Payer: Self-pay | Admitting: Adult Health

## 2018-12-07 VITALS — BP 136/78 | HR 70 | Temp 97.5°F | Ht 67.5 in | Wt 181.0 lb

## 2018-12-07 DIAGNOSIS — F419 Anxiety disorder, unspecified: Secondary | ICD-10-CM

## 2018-12-07 DIAGNOSIS — Z0000 Encounter for general adult medical examination without abnormal findings: Secondary | ICD-10-CM | POA: Diagnosis not present

## 2018-12-07 DIAGNOSIS — I10 Essential (primary) hypertension: Secondary | ICD-10-CM | POA: Diagnosis not present

## 2018-12-07 DIAGNOSIS — I253 Aneurysm of heart: Secondary | ICD-10-CM

## 2018-12-07 DIAGNOSIS — N819 Female genital prolapse, unspecified: Secondary | ICD-10-CM | POA: Insufficient documentation

## 2018-12-07 DIAGNOSIS — E785 Hyperlipidemia, unspecified: Secondary | ICD-10-CM

## 2018-12-07 DIAGNOSIS — E663 Overweight: Secondary | ICD-10-CM

## 2018-12-07 DIAGNOSIS — N8189 Other female genital prolapse: Secondary | ICD-10-CM

## 2018-12-07 DIAGNOSIS — Z79899 Other long term (current) drug therapy: Secondary | ICD-10-CM

## 2018-12-07 DIAGNOSIS — E559 Vitamin D deficiency, unspecified: Secondary | ICD-10-CM

## 2018-12-07 DIAGNOSIS — Z1159 Encounter for screening for other viral diseases: Secondary | ICD-10-CM | POA: Diagnosis not present

## 2018-12-07 DIAGNOSIS — F3342 Major depressive disorder, recurrent, in full remission: Secondary | ICD-10-CM

## 2018-12-07 DIAGNOSIS — R002 Palpitations: Secondary | ICD-10-CM | POA: Insufficient documentation

## 2018-12-07 DIAGNOSIS — R7309 Other abnormal glucose: Secondary | ICD-10-CM

## 2018-12-07 NOTE — Patient Instructions (Addendum)
Samantha Terrell , Thank you for taking time to come for your Annual Wellness Visit. I appreciate your ongoing commitment to your health goals. Please review the following plan we discussed and let me know if I can assist you in the future.   These are the goals we discussed: Goals    . Blood Pressure < 130/80    . LDL CALC < 130    . Reduce alcohol intake     Aim to reduce to 1 drink/day       This is a list of the screening recommended for you and due dates:  Health Maintenance  Topic Date Due  .  Hepatitis C: One time screening is recommended by Center for Disease Control  (CDC) for  adults born from [birth year] through 1963.   1960-08-03  . Flu Shot  02/26/2019  . Mammogram  06/04/2019  . Colon Cancer Screening  12/22/2019  . Pap Smear  06/03/2020  . Tetanus Vaccine  04/19/2025  . HIV Screening  Completed     Know what a healthy weight is for you (roughly BMI <25) and aim to maintain this  Aim for 7+ servings of fruits and vegetables daily  65-80+ fluid ounces of water or unsweet tea for healthy kidneys  Limit to max 1 drink of alcohol per day; avoid smoking/tobacco  Limit animal fats in diet for cholesterol and heart health - choose grass fed whenever available  Avoid highly processed foods, and foods high in saturated/trans fats  Aim for low stress - take time to unwind and care for your mental health  Aim for 150 min of moderate intensity exercise weekly for heart health, and weights twice weekly for bone health  Aim for 7-9 hours of sleep daily   Preventing High Cholesterol Cholesterol is a waxy, fat-like substance that your body needs in small amounts. Your liver makes all the cholesterol that your body needs. Having high cholesterol (hypercholesterolemia) increases your risk for heart disease and stroke. Extra (excess) cholesterol comes from the food you eat, such as animal-based fat (saturated fat) from meat and some dairy products. High cholesterol can often be  prevented with diet and lifestyle changes. If you already have high cholesterol, you can control it with diet and lifestyle changes, as well as medicine. What nutrition changes can be made?  Eat less saturated fat. Foods that contain saturated fat include red meat and some dairy products.  Avoid processed meats, like bacon and lunch meats.  Avoid trans fats, which are found in margarine and some baked goods.  Avoid foods and beverages that have added sugars.  Eat more fruits, vegetables, and whole grains.  Choose healthy sources of protein, such as fish, poultry, and nuts.  Choose healthy sources of fat, such as: ? Nuts. ? Vegetable oils, especially olive oil. ? Fish that have healthy fats (omega-3 fatty acids), such as mackerel or salmon. What lifestyle changes can be made?   Lose weight if you are overweight. Losing 5-10 lb (2.3-4.5 kg) can help prevent or control high cholesterol and reduce your risk for diabetes and high blood pressure. Ask your health care provider to help you with a diet and exercise plan to safely lose weight.  Get enough exercise. Do at least 150 minutes of moderate-intensity exercise each week. ? You could do this in short exercise sessions several times a day, or you could do longer exercise sessions a few times a week. For example, you could take a brisk 10-minute walk or  bike ride, 3 times a day, for 5 days a week.  Do not smoke. If you need help quitting, ask your health care provider.  Limit your alcohol intake. If you drink alcohol, limit alcohol intake to no more than 1 drink a day for nonpregnant women and 2 drinks a day for men. One drink equals 12 oz of beer, 5 oz of wine, or 1 oz of hard liquor. Why are these changes important?  If you have high cholesterol, deposits (plaques) may build up on the walls of your blood vessels. Plaques make the arteries narrower and stiffer, which can restrict or block blood flow and cause blood clots to form. This  greatly increases your risk for heart attack and stroke. Making diet and lifestyle changes can reduce your risk for these life-threatening conditions. What can I do to lower my risk?  Manage your risk factors for high cholesterol. Talk with your health care provider about all of your risk factors and how to lower your risk.  Manage other conditions that you have, such as diabetes or high blood pressure (hypertension).  Have your cholesterol checked at regular intervals.  Keep all follow-up visits as told by your health care provider. This is important. How is this treated? In addition to diet and lifestyle changes, your health care provider may recommend medicines to help lower cholesterol, such as a medicine to reduce the amount of cholesterol made in your liver. You may need medicine if:  Diet and lifestyle changes do not lower your cholesterol enough.  You have high cholesterol and other risk factors for heart disease or stroke. Take over-the-counter and prescription medicines only as told by your health care provider. Where to find more information  American Heart Association: ThisTune.com.pt.jsp  National Heart, Lung, and Blood Institute: FrenchToiletries.com.cy Summary  High cholesterol increases your risk for heart disease and stroke. By keeping your cholesterol level low, you can reduce your risk for these conditions.  Diet and lifestyle changes are the most important steps in preventing high cholesterol.  Work with your health care provider to manage your risk factors, and have your blood tested regularly. This information is not intended to replace advice given to you by your health care provider. Make sure you discuss any questions you have with your health care provider. Document Released: 07/29/2015 Document Revised: 03/22/2016 Document Reviewed:  03/22/2016 Elsevier Interactive Patient Education  2019 [employer].

## 2018-12-08 LAB — TSH: TSH: 1.67 mIU/L (ref 0.40–4.50)

## 2018-12-08 LAB — CBC WITH DIFFERENTIAL/PLATELET
Absolute Monocytes: 681 cells/uL (ref 200–950)
Basophils Absolute: 58 cells/uL (ref 0–200)
Basophils Relative: 0.7 %
Eosinophils Absolute: 108 cells/uL (ref 15–500)
Eosinophils Relative: 1.3 %
HCT: 39.2 % (ref 35.0–45.0)
Hemoglobin: 13.1 g/dL (ref 11.7–15.5)
Lymphs Abs: 2498 cells/uL (ref 850–3900)
MCH: 30 pg (ref 27.0–33.0)
MCHC: 33.4 g/dL (ref 32.0–36.0)
MCV: 89.9 fL (ref 80.0–100.0)
MPV: 9.4 fL (ref 7.5–12.5)
Monocytes Relative: 8.2 %
Neutro Abs: 4955 cells/uL (ref 1500–7800)
Neutrophils Relative %: 59.7 %
Platelets: 269 10*3/uL (ref 140–400)
RBC: 4.36 10*6/uL (ref 3.80–5.10)
RDW: 12 % (ref 11.0–15.0)
Total Lymphocyte: 30.1 %
WBC: 8.3 10*3/uL (ref 3.8–10.8)

## 2018-12-08 LAB — LIPID PANEL
Cholesterol: 181 mg/dL (ref <200)
HDL: 59 mg/dL (ref >=50)
LDL Cholesterol (Calc): 100 mg/dL (calc) — ABNORMAL HIGH
Non-HDL Cholesterol (Calc): 122 mg/dL (calc) (ref <130)
Total CHOL/HDL Ratio: 3.1 (calc) (ref <5.0)
Triglycerides: 119 mg/dL (ref <150)

## 2018-12-08 LAB — URINALYSIS, ROUTINE W REFLEX MICROSCOPIC
Bacteria, UA: NONE SEEN /HPF
Bilirubin Urine: NEGATIVE
Glucose, UA: NEGATIVE
Hgb urine dipstick: NEGATIVE
Hyaline Cast: NONE SEEN /LPF
Ketones, ur: NEGATIVE
Nitrite: NEGATIVE
Protein, ur: NEGATIVE
RBC / HPF: NONE SEEN /HPF (ref 0–2)
Specific Gravity, Urine: 1.015 (ref 1.001–1.03)
WBC, UA: NONE SEEN /HPF (ref 0–5)
pH: 6 (ref 5.0–8.0)

## 2018-12-08 LAB — COMPLETE METABOLIC PANEL WITH GFR
AG Ratio: 1.8 (calc) (ref 1.0–2.5)
ALT: 15 U/L (ref 6–29)
AST: 19 U/L (ref 10–35)
Albumin: 4.4 g/dL (ref 3.6–5.1)
Alkaline phosphatase (APISO): 71 U/L (ref 37–153)
BUN: 12 mg/dL (ref 7–25)
CO2: 27 mmol/L (ref 20–32)
Calcium: 9.7 mg/dL (ref 8.6–10.4)
Chloride: 105 mmol/L (ref 98–110)
Creat: 0.83 mg/dL (ref 0.50–1.05)
GFR, Est African American: 91 mL/min/{1.73_m2} (ref >=60)
GFR, Est Non African American: 79 mL/min/{1.73_m2} (ref >=60)
Globulin: 2.5 g/dL (calc) (ref 1.9–3.7)
Glucose, Bld: 86 mg/dL (ref 65–99)
Potassium: 4.4 mmol/L (ref 3.5–5.3)
Sodium: 141 mmol/L (ref 135–146)
Total Bilirubin: 0.5 mg/dL (ref 0.2–1.2)
Total Protein: 6.9 g/dL (ref 6.1–8.1)

## 2018-12-08 LAB — MAGNESIUM: Magnesium: 1.8 mg/dL (ref 1.5–2.5)

## 2018-12-08 LAB — HEPATITIS C ANTIBODY
Hepatitis C Ab: NONREACTIVE
SIGNAL TO CUT-OFF: 0.03 (ref <1.00)

## 2018-12-08 LAB — VITAMIN D 25 HYDROXY (VIT D DEFICIENCY, FRACTURES): Vit D, 25-Hydroxy: 57 ng/mL (ref 30–100)

## 2019-02-08 ENCOUNTER — Other Ambulatory Visit: Payer: Self-pay | Admitting: Internal Medicine

## 2019-03-05 ENCOUNTER — Emergency Department (HOSPITAL_BASED_OUTPATIENT_CLINIC_OR_DEPARTMENT_OTHER)
Admission: EM | Admit: 2019-03-05 | Discharge: 2019-03-06 | Disposition: A | Discharge status: Home / Self Care | Payer: 59 | Attending: Emergency Medicine | Admitting: Emergency Medicine

## 2019-03-05 ENCOUNTER — Encounter (HOSPITAL_BASED_OUTPATIENT_CLINIC_OR_DEPARTMENT_OTHER): Payer: Self-pay | Admitting: Adult Health

## 2019-03-05 ENCOUNTER — Other Ambulatory Visit: Payer: Self-pay

## 2019-03-05 DIAGNOSIS — I10 Essential (primary) hypertension: Secondary | ICD-10-CM | POA: Diagnosis not present

## 2019-03-05 DIAGNOSIS — S0990XA Unspecified injury of head, initial encounter: Secondary | ICD-10-CM | POA: Diagnosis present

## 2019-03-05 DIAGNOSIS — Y9389 Activity, other specified: Secondary | ICD-10-CM | POA: Insufficient documentation

## 2019-03-05 DIAGNOSIS — S0011XA Contusion of right eyelid and periocular area, initial encounter: Secondary | ICD-10-CM

## 2019-03-05 DIAGNOSIS — Y999 Unspecified external cause status: Secondary | ICD-10-CM | POA: Diagnosis not present

## 2019-03-05 DIAGNOSIS — Y92009 Unspecified place in unspecified non-institutional (private) residence as the place of occurrence of the external cause: Secondary | ICD-10-CM | POA: Diagnosis not present

## 2019-03-05 DIAGNOSIS — W01198A Fall on same level from slipping, tripping and stumbling with subsequent striking against other object, initial encounter: Secondary | ICD-10-CM | POA: Diagnosis not present

## 2019-03-05 NOTE — ED Triage Notes (Signed)
Samantha Terrell has been drinking wine this evening due to getting into an argument with her sister, she over drank. HEr husband tried to get up and go to bed and the patient tripped over her coffee table and fell into her fireplace. She is very tearful due to the argument with her sister. She reports that she does not remember the incidnet. Her husband states it happened a few minutes ago.

## 2019-03-06 NOTE — ED Provider Notes (Signed)
Emergency Department Provider Note   I have reviewed the triage vital signs and the nursing notes.   HISTORY  Chief Complaint Head Injury   HPI Samantha Terrell is a 58 y.o. female without significant past medical history who presents the emergency department today after a fall.  Patient was stressed out and upset due to family matters and had drank a couple bottles of wine.  She had fallen asleep on the couch and her husband try to get her to go to bed when she tripped over a coffee table and hit the right side of her forehead on a fireplace hearth.  No loss of consciousness, vomiting or severe headache but instantly had a large swollen area there.   Patient does not remember the event but also self identifies as being quite intoxicated.  No other associated or modifying symptoms.    Past Medical History:  Diagnosis Date  . Absolute anemia 04/24/2015  . Anxiety   . Degenerative disc disease, cervical   . Depression   . Hypertension   . Migraine    history, has not had in years  . PVC (premature ventricular contraction)     Patient Active Problem List   Diagnosis Date Noted  . Pelvic prolapse with cystocele 12/07/2018  . Intermittent palpitations 12/07/2018  . Overweight (BMI 25.0-29.9) 12/02/2017  . Atrial septal aneurysm 10/17/2015  . Medication management 10/17/2015  . Vitamin D deficiency 10/17/2015  . Hyperlipidemia 04/24/2015  . Hypertension   . Depression   . Anxiety     Past Surgical History:  Procedure Laterality Date  . COLONOSCOPY    . NASAL RECONSTRUCTION  1986   Septal repair    Current Outpatient Rx  . Order #: [id number] Class: Historical Med  . Order #: [id number] Class: Historical Med  . Order #: [id number] Class: Historical Med  . Order #: [id number] Class: Historical Med  . Order #: [id number] Class: Historical Med  . Order #: [id number] Class: Historical Med  . Order #: [id number] Class: Historical Med  . Order #: [id number] Class: Normal  . Order #:  [id number] Class: Historical Med    Allergies Patient has no known allergies.  Family History  Problem Relation Age of Onset  . Arthritis Mother        bilateral knees  . Hyperlipidemia Father   . Cancer Maternal Grandmother        cervical cancer  . Osteoporosis Maternal Grandmother   . Heart attack Maternal Grandmother        age 94  . Congestive Heart Failure Maternal Grandmother        age 59  . Osteoporosis Paternal Grandmother   . Arthritis Maternal Grandfather        Severe, bilateral hands  . Vascular Disease Maternal Grandfather        smoker, PAD  . Colon cancer Neg Hx   . Breast cancer Neg Hx     Social History Social History   Tobacco Use  . Smoking status: Never Smoker  . Smokeless tobacco: Never Used  Substance Use Topics  . Alcohol use: Yes    Alcohol/week: 14.0 standard drinks    Types: 14 Glasses of wine per week  . Drug use: No    Review of Systems  All other systems negative except as documented in the HPI. All pertinent positives and negatives as reviewed in the HPI. ____________________________________________   PHYSICAL EXAM:  VITAL SIGNS: ED Triage Vitals  Enc Vitals Group     BP 03/05/19 2322 (!) 163/87  Pulse Rate 03/05/19 2322 96     Resp 03/05/19 2322 18     Temp 03/05/19 2322 98.5 F (36.9 C)     Temp Source 03/05/19 2322 Oral     SpO2 03/05/19 2322 100 %     Weight 03/05/19 2323 180 lb (81.6 kg)     Height 03/05/19 2323 5' 7.5" (1.715 m)    Constitutional: Alert and oriented. Well appearing and in no acute distress. Eyes: Conjunctivae are normal. PERRL. EOMI. Head: Atraumatic. Nose: No congestion/rhinnorhea. Mouth/Throat: Mucous membranes are moist.  Oropharynx non-erythematous. Neck: No stridor.  No meningeal signs.   Cardiovascular: Normal rate, regular rhythm. Good peripheral circulation. Grossly normal heart sounds.   Respiratory: Normal respiratory effort.  No retractions. Lungs CTAB. Gastrointestinal: Soft and  nontender. No distention.  Musculoskeletal: No lower extremity tenderness nor edema. No gross deformities of extremities. Neurologic:  Normal speech and language.  Symmetric and normal strength in bicep, tricep, dorsiflexion and plantarflexion of feet.  Intact sensation to light touch in extremities.  Mild dysmetria with both hands.  Mild nystagmus, horizontal. Skin:  Skin is warm, dry and intact. No rash noted.   ____________________________________________   INITIAL IMPRESSION / ASSESSMENT AND PLAN / ED COURSE  Large contusion above her right eye low suspicion for intracranial injury or bleed at this time.  No blood thinners no neurologic changes suspect that her mild nystagmus and dysmetria secondary to being intoxicated.  Husband will watch her and bring her back with any worsening symptoms. Supportive care otherwise.   Pertinent labs & imaging results that were available during my care of the patient were reviewed by me and considered in my medical decision making (see chart for details).  A medical screening exam was performed and I feel the patient has had an appropriate workup for their chief complaint at this time and likelihood of emergent condition existing is low. They have been counseled on decision, discharge, follow up and which symptoms necessitate immediate return to the emergency department. They or their family verbally stated understanding and agreement with plan and discharged in stable condition.   ____________________________________________  FINAL CLINICAL IMPRESSION(S) / ED DIAGNOSES  Final diagnoses:  Contusion of right periocular region, initial encounter  Minor head injury, initial encounter     MEDICATIONS GIVEN DURING THIS VISIT:  Medications - No data to display   NEW OUTPATIENT MEDICATIONS STARTED DURING THIS VISIT:  New Prescriptions   No medications on file    Note:  This note was prepared with assistance of Dragon voice recognition software.  Occasional wrong-word or sound-a-like substitutions may have occurred due to the inherent limitations of voice recognition software.   Kirk Basquez, Corene Cornea, MD 03/06/19 [phone]

## 2019-07-21 ENCOUNTER — Other Ambulatory Visit: Payer: Self-pay | Admitting: Adult Health

## 2019-12-12 ENCOUNTER — Encounter: Payer: 59 | Admitting: Adult Health

## 2019-12-16 NOTE — Progress Notes (Signed)
Complete Physical  Assessment and Plan:  Encounter for routine adult medical exam with abnormal findings UTD on health maintenance; she will forward mammogram report  Essential hypertension Increase verapamil to 240 mg, follow up 1 month. If remains sig elevated plan to switch to BB for improved BP control  Monitor blood pressure at home; call if consistently over 130/80 Continue DASH diet.   Reminder to go to the ER if any CP, SOB, nausea, dizziness, severe HA, changes vision/speech, left arm numbness and tingling and jaw pain. -     EKG 12-Lead -     Magnesium  Intermittent palpitations Triggered by stress; on verapamil 120 mg, recently with some breakthrough Check EKG, discussed trial of increasing to 240 mg, she will trial for next month and follow up   Atrial septal aneurysm Has been worked up by Dr. Wynonia Lawman; monitor; follow up cardiology as needed  Vitamin D deficiency Continue supplementation Check vitamin D level -     VITAMIN D 25 Hydroxy (Vit-D Deficiency, Fractures)  Medication management -     CBC with Differential/Platelet -     COMPLETE METABOLIC PANEL WITH GFR -     Urinalysis w microscopic + reflex cultur -     Magnesium  Hyperlipidemia, unspecified hyperlipidemia type Continue fiber supplement Continue low cholesterol diet and exercise.  Check lipid panel today -     Lipid panel -     TSH  Recurrent major depressive disorder, in full remission (HCC)/ Anxiety Controlled; Continue zoloft Lifestyle discussed: diet/exerise, sleep hygiene, stress management, hydration  Screening for diabetes mellitus -    A1C  Screening for hematuria or proteinuria -     Urinalysis w microscopic   Overweight (BMI 25.0-29.9) Long discussion about weight loss, diet, and exercise Recommended diet heavy in fruits and veggies and low in animal meats, cheeses, and dairy products, appropriate calorie intake Discussed appropriate weight for height Follow up at next  visit  Pelvic prolapse with cystocele Per Dr. Ulanda Edison; was doing PT; doing exercises at home  History of colon polyps Due follow up May 2021; referral placed back to Dr. Olevia Perches   Orders Placed This Encounter  Procedures  . CBC with Differential/Platelet  . COMPLETE METABOLIC PANEL WITH GFR  . Magnesium  . Lipid panel  . TSH  . Hemoglobin A1c  . VITAMIN D 25 Hydroxy (Vit-D Deficiency, Fractures)  . Urinalysis, Routine w reflex microscopic  . Vitamin B12  . Ambulatory referral to Gastroenterology  . EKG 12-Lead     Discussed med's effects and SE's. Screening labs and tests as requested with regular follow-up as recommended. Over 40 minutes of exam, counseling, chart review, and complex, high level critical decision making was performed this visit.   Future Appointments  Date Time Provider [place]  12/18/2020  3:00 PM Liane Comber, NP [facility] None     HPI  59 y.o. female  presents for a complete physical and follow up for has Hypertension; Depression; Anxiety; Hyperlipidemia; Atrial septal aneurysm; Medication management; Vitamin D deficiency; Overweight (BMI 25.0-29.9); Pelvic prolapse with cystocele; Intermittent palpitations; and History of colon polyps on their problem list.   She is married, 1 son, she is doing accounting, back in office. Just back from vacation to [facility].    Denies any concerns today.   Has mainly been seeing Dr. Philis Pique (OB/GYN) and UC, getting estrogen.  She has a mild prolapse with cystocele, was doing therapy, was doing home pelvic exercises but admits hasn't been doing, trying  to postpone surgery.   She is on zoloft 50 mg daily for anxiety and on verapamil for palpitations, has seen Dr. Fidela Juneau, had echo with [facility], atrial septal aneursym.   BMI is Body mass index is 28.69 kg/m., she has been working on diet and exercise, she has been cutting down on ice cream/sweets, avoids red meat, chooses chicken or seafood. Watches  sugar/carbs closely.  Fluids: decaf tea with 4 tablespoons of sugar for whole pitcher - has tea with stevia at work, limits soda intake, typically diet soda at a movie  Treadmill 4 days a week, 40 min each  Reduced alcohol from last year - down to a few drinks on the weekends Sleeps well  Wt Readings from Last 3 Encounters:  12/19/19 183 lb 3.2 oz (83.1 kg)  03/05/19 180 lb (81.6 kg)  12/07/18 181 lb (82.1 kg)   She admits hasn't been checking BP, today their BP is BP: (!) 150/88 Similar by manual recheck by provider. She feels is stressed more than typical today - wonders if this is contributing. Continues to have some palpitation break through occasionally -  She does workout. She denies chest pain, shortness of breath, dizziness.   She is not on cholesterol medication (is on fiber supplement). Her cholesterol is not at goal. She has been working on diet for cholesterol. The cholesterol last visit was:   Lab Results  Component Value Date   CHOL 181 12/07/2018   HDL 59 12/07/2018   LDLCALC 100 (H) 12/07/2018   TRIG 119 12/07/2018   CHOLHDL 3.1 12/07/2018    Last A1C in the office was:   Lab Results  Component Value Date   HGBA1C 5.3 12/03/2017   Last GFR: Lab Results  Component Value Date   GFRNONAA 79 12/07/2018   Patient is on Vitamin D supplement.   Lab Results  Component Value Date   VD25OH 57 12/07/2018     B12 borderline low in 2019, had been on a daily supplement but reports stopped/has been out:  Lab Results  Component Value Date   [id number] 12/03/2017    Current Medications:  Current Outpatient Medications on File Prior to Visit  Medication Sig Dispense Refill  . Biotin 10000 MCG TABS Take by mouth.    . norethindrone-ethinyl estradiol (FEMHRT 1/5) 1-5 MG-MCG TABS tablet Take 1 tablet by mouth daily.    . Omega-3 Fatty Acids (FISH OIL PO) Take by mouth.    Marland Kitchen OVER THE COUNTER MEDICATION 5,000 mcg 2 (two) times a day. DHT blocker    . sertraline (ZOLOFT)  100 MG tablet     . verapamil (CALAN-SR) 120 MG CR tablet TAKE 1 TABLET BY MOUTH EVERYDAY AT BEDTIME 30 tablet 5  . Vitamin D, Cholecalciferol, 1000 UNITS CAPS Take by mouth.    . Cyanocobalamin (VITAMIN B-12 PO) Take by mouth.    . Multiple Vitamins-Minerals (HAIR/SKIN/NAILS PO) Take by mouth.     No current facility-administered medications on file prior to visit.   Allergies:  Allergies  Allergen Reactions  . Latex Itching   Medical History:  She has Hypertension; Depression; Anxiety; Hyperlipidemia; Atrial septal aneurysm; Medication management; Vitamin D deficiency; Overweight (BMI 25.0-29.9); Pelvic prolapse with cystocele; Intermittent palpitations; and History of colon polyps on their problem list.   Health Maintenance:    Health Maintenance  Topic Date Due  . MAMMOGRAM  06/04/2019  . COLONOSCOPY  12/22/2019  . INFLUENZA VACCINE  02/26/2020  . PAP SMEAR-Modifier  06/03/2020  . TETANUS/TDAP  04/19/2025  . COVID-19 Vaccine  Completed  . Hepatitis C Screening  Completed  . HIV Screening  Completed   Tetanus: 2016  Pneumovax: 2016 Shingrix: 2019 2/2  No LMP recorded (lmp unknown). Patient is postmenopausal.  Pap: 05/2018, PAP q3y, has pelvic annually with Dr. Mickie Hillier: last 2021 per patient, gets annually at GYN, patient will forward record DEXA: N/A  Colonoscopy: 11/2014, polyps, Dr. Olevia Perches, due 11/2019  EGD: N/A  Last Dental Exam: Dr. Trenton Gammon, q 6 months, last 2021, goes q83m Last Eye Exam: wears glasses, [facility], last exam 07/2019, going every other year  Patient Care Team: Unk Pinto, MD as PCP - General (Internal Medicine)  Surgical History:  She has a past surgical history that includes Nasal reconstruction (1986) and Colonoscopy. Family History:  Herfamily history includes Arthritis in her maternal grandfather and mother; Cancer in her maternal grandmother; Congestive Heart Failure in her maternal grandmother; Heart attack in her maternal  grandmother; Hyperlipidemia in her father; Osteoporosis in her maternal grandmother and paternal grandmother; Vascular Disease in her maternal grandfather. Social History:  She reports that she has never smoked. She has never used smokeless tobacco. She reports current alcohol use of about 6.0 standard drinks of alcohol per week. She reports that she does not use drugs.  Review of Systems: Review of Systems  Constitutional: Negative for malaise/fatigue and weight loss.  HENT: Negative for hearing loss and tinnitus.   Eyes: Negative for blurred vision and double vision.  Respiratory: Negative for cough, sputum production, shortness of breath and wheezing.   Cardiovascular: Negative for chest pain, palpitations, orthopnea, claudication, leg swelling and PND.       Occasional skipped beat  Gastrointestinal: Negative for abdominal pain, blood in stool, constipation, diarrhea, heartburn, melena, nausea and vomiting.  Genitourinary: Negative.   Musculoskeletal: Negative for falls, joint pain and myalgias.  Skin: Negative for rash.  Neurological: Negative for dizziness, tingling, sensory change, weakness and headaches.  Endo/Heme/Allergies: Negative for polydipsia.  Psychiatric/Behavioral: Negative.  Negative for depression, memory loss, substance abuse and suicidal ideas. The patient is not nervous/anxious and does not have insomnia.   All other systems reviewed and are negative.   Physical Exam: Estimated body mass index is 28.69 kg/m as calculated from the following:   Height as of this encounter: 5\' 7"  (1.702 m).   Weight as of this encounter: 183 lb 3.2 oz (83.1 kg). BP (!) 150/88   Pulse 80   Temp (!) 97.3 F (36.3 C)   Ht 5\' 7"  (1.702 m)   Wt 183 lb 3.2 oz (83.1 kg)   LMP  (LMP Unknown)   SpO2 99%   BMI 28.69 kg/m  General Appearance: Well nourished, in no apparent distress.  Eyes: PERRLA, EOMs, conjunctiva no swelling or erythema Sinuses: No Frontal/maxillary tenderness   ENT/Mouth: Ext aud canals clear, normal light reflex with TMs without erythema, bulging. Good dentition. No erythema, swelling, or exudate on post pharynx. Tonsils not swollen or erythematous. Hearing normal.  Neck: Supple, thyroid normal. No bruits  Respiratory: Respiratory effort normal, BS equal bilaterally without rales, rhonchi, wheezing or stridor.  Cardio: RRR without murmurs, rubs or gallops. Brisk peripheral pulses without edema.  Chest: symmetric, with normal excursions and percussion.  Breasts: Defer to GYN Abdomen: Soft, nontender, no guarding, rebound, hernias, masses, or organomegaly.  Lymphatics: Non tender without lymphadenopathy.  Genitourinary: Defer to GYN Musculoskeletal: Full ROM all peripheral extremities,5/5 strength, and normal gait.  Skin: Warm, dry without rashes, lesions, ecchymosis. Neuro:  Cranial nerves intact, reflexes equal bilaterally. Normal muscle tone, no cerebellar symptoms. Sensation intact.  Psych: Awake and oriented X 3, normal affect, Insight and Judgment appropriate.   EKG: WNL no ST changes.  Izora Ribas 3:34 PM [facility]

## 2019-12-19 ENCOUNTER — Ambulatory Visit (INDEPENDENT_AMBULATORY_CARE_PROVIDER_SITE_OTHER): Payer: 59 | Admitting: Adult Health

## 2019-12-19 ENCOUNTER — Other Ambulatory Visit: Payer: Self-pay

## 2019-12-19 ENCOUNTER — Encounter: Payer: Self-pay | Admitting: Adult Health

## 2019-12-19 VITALS — BP 150/88 | HR 80 | Temp 97.3°F | Ht 67.0 in | Wt 183.2 lb

## 2019-12-19 DIAGNOSIS — I253 Aneurysm of heart: Secondary | ICD-10-CM

## 2019-12-19 DIAGNOSIS — E538 Deficiency of other specified B group vitamins: Secondary | ICD-10-CM

## 2019-12-19 DIAGNOSIS — Z0000 Encounter for general adult medical examination without abnormal findings: Secondary | ICD-10-CM | POA: Diagnosis not present

## 2019-12-19 DIAGNOSIS — Z0001 Encounter for general adult medical examination with abnormal findings: Secondary | ICD-10-CM

## 2019-12-19 DIAGNOSIS — N8189 Other female genital prolapse: Secondary | ICD-10-CM

## 2019-12-19 DIAGNOSIS — Z79899 Other long term (current) drug therapy: Secondary | ICD-10-CM

## 2019-12-19 DIAGNOSIS — E785 Hyperlipidemia, unspecified: Secondary | ICD-10-CM

## 2019-12-19 DIAGNOSIS — R7309 Other abnormal glucose: Secondary | ICD-10-CM

## 2019-12-19 DIAGNOSIS — Z86010 Personal history of colonic polyps: Secondary | ICD-10-CM | POA: Insufficient documentation

## 2019-12-19 DIAGNOSIS — Z1389 Encounter for screening for other disorder: Secondary | ICD-10-CM

## 2019-12-19 DIAGNOSIS — Z131 Encounter for screening for diabetes mellitus: Secondary | ICD-10-CM

## 2019-12-19 DIAGNOSIS — E559 Vitamin D deficiency, unspecified: Secondary | ICD-10-CM

## 2019-12-19 DIAGNOSIS — I10 Essential (primary) hypertension: Secondary | ICD-10-CM

## 2019-12-19 DIAGNOSIS — F419 Anxiety disorder, unspecified: Secondary | ICD-10-CM

## 2019-12-19 DIAGNOSIS — F3342 Major depressive disorder, recurrent, in full remission: Secondary | ICD-10-CM

## 2019-12-19 DIAGNOSIS — Z1329 Encounter for screening for other suspected endocrine disorder: Secondary | ICD-10-CM

## 2019-12-19 DIAGNOSIS — E663 Overweight: Secondary | ICD-10-CM

## 2019-12-19 DIAGNOSIS — Z136 Encounter for screening for cardiovascular disorders: Secondary | ICD-10-CM

## 2019-12-19 DIAGNOSIS — R002 Palpitations: Secondary | ICD-10-CM

## 2019-12-19 MED ORDER — VERAPAMIL HCL ER 120 MG PO TBCR: 240.0000 mg | EXTENDED_RELEASE_TABLET | Freq: Every day | ORAL | 5 refills | Status: DC

## 2019-12-19 NOTE — Patient Instructions (Addendum)
[name] , Thank you for taking time to come for your Annual Wellness Visit. I appreciate your ongoing commitment to your health goals. Please review the following plan we discussed and let me know if I can assist you in the future.   These are the goals we discussed: Goals    . Blood Pressure < 130/80    . LDL CALC < 130    . Reduce alcohol intake     Aim to reduce to 1 drink/day       This is a list of the screening recommended for you and due dates:  Health Maintenance  Topic Date Due  . Mammogram  06/04/2019  . Colon Cancer Screening  12/22/2019  . Flu Shot  02/26/2020  . Pap Smear  06/03/2020  . Tetanus Vaccine  04/19/2025  . COVID-19 Vaccine  Completed  .  Hepatitis C: One time screening is recommended by Center for Disease Control  (CDC) for  adults born from [birth year] through 1963.   Completed  . HIV Screening  Completed    Please forward last mammogram report    Please increase verapamil to 2 tabs daily, keep daily log of blood pressures and follow up in 1 month     Know what a healthy weight is for you (roughly BMI <25) and aim to maintain this  Aim for 7+ servings of fruits and vegetables daily  65-80+ fluid ounces of water or unsweet tea for healthy kidneys  Limit to max 1 drink of alcohol per day; avoid smoking/tobacco  Limit animal fats in diet for cholesterol and heart health - choose grass fed whenever available  Avoid highly processed foods, and foods high in saturated/trans fats  Aim for low stress - take time to unwind and care for your mental health  Aim for 150 min of moderate intensity exercise weekly for heart health, and weights twice weekly for bone health  Aim for 7-9 hours of sleep daily    A great goal to work towards is aiming to get in a serving daily of some of the most nutritionally dense foods - G- BOMBS daily          HYPERTENSION INFORMATION  Monitor your blood pressure at home, please keep a record and bring that in  with you to your next office visit.   Go to the ER if any CP, SOB, nausea, dizziness, severe HA, changes vision/speech  Testing/Procedures: HOW TO TAKE YOUR BLOOD PRESSURE:  Rest 5 minutes before taking your blood pressure.  Don't smoke or drink caffeinated beverages for at least 30 minutes before.  Take your blood pressure before (not after) you eat.  Sit comfortably with your back supported and both feet on the floor (don't cross your legs).  Elevate your arm to heart level on a table or a desk.  Use the proper sized cuff. It should fit smoothly and snugly around your bare upper arm. There should be enough room to slip a fingertip under the cuff. The bottom edge of the cuff should be 1 inch above the crease of the elbow.  Your most recent BP: BP: (!) 150/88   Take your medications faithfully as instructed. Maintain a healthy weight. Get at least 150 minutes of aerobic exercise per week. Minimize salt intake. Minimize alcohol intake  DASH Eating Plan DASH stands for "Dietary Approaches to Stop Hypertension." The DASH eating plan is a healthy eating plan that has been shown to reduce high blood pressure (hypertension). Additional health  benefits may include reducing the risk of type 2 diabetes mellitus, heart disease, and stroke. The DASH eating plan may also help with weight loss. WHAT DO I NEED TO KNOW ABOUT THE DASH EATING PLAN? For the DASH eating plan, you will follow these general guidelines:  Choose foods with a percent daily value for sodium of less than 5% (as listed on the food label).  Use salt-free seasonings or herbs instead of table salt or sea salt.  Check with your health care provider or pharmacist before using salt substitutes.  Eat lower-sodium products, often labeled as "lower sodium" or "no salt added."  Eat fresh foods.  Eat more vegetables, fruits, and low-fat dairy products.  Choose whole grains. Look for the word "whole" as the first word in the  ingredient list.  Choose fish and skinless chicken or [place] more often than red meat. Limit fish, poultry, and meat to 6 oz (170 g) each day.  Limit sweets, desserts, sugars, and sugary drinks.  Choose heart-healthy fats.  Limit cheese to 1 oz (28 g) per day.  Eat more home-cooked food and less restaurant, buffet, and fast food.  Limit fried foods.  Cook foods using methods other than frying.  Limit canned vegetables. If you do use them, rinse them well to decrease the sodium.  When eating at a restaurant, ask that your food be prepared with less salt, or no salt if possible. WHAT FOODS CAN I EAT? Seek help from a dietitian for individual calorie needs. Grains Whole grain or whole wheat bread. Brown rice. Whole grain or whole wheat pasta. Quinoa, bulgur, and whole grain cereals. Low-sodium cereals. Corn or whole wheat flour tortillas. Whole grain cornbread. Whole grain crackers. Low-sodium crackers. Vegetables Fresh or frozen vegetables (raw, steamed, roasted, or grilled). Low-sodium or reduced-sodium tomato and vegetable juices. Low-sodium or reduced-sodium tomato sauce and paste. Low-sodium or reduced-sodium canned vegetables.  Fruits All fresh, canned (in natural juice), or frozen fruits. Meat and Other Protein Products Ground beef (85% or leaner), grass-fed beef, or beef trimmed of fat. Skinless chicken or [place]. Ground chicken or [place]. Pork trimmed of fat. All fish and seafood. Eggs. Dried beans, peas, or lentils. Unsalted nuts and seeds. Unsalted canned beans. Dairy Low-fat dairy products, such as skim or 1% milk, 2% or reduced-fat cheeses, low-fat ricotta or cottage cheese, or plain low-fat yogurt. Low-sodium or reduced-sodium cheeses. Fats and Oils Tub margarines without trans fats. Light or reduced-fat mayonnaise and salad dressings (reduced sodium). Avocado. Safflower, olive, or canola oils. Natural peanut or almond butter. Other Unsalted popcorn and pretzels. The  items listed above may not be a complete list of recommended foods or beverages. Contact your dietitian for more options. WHAT FOODS ARE NOT RECOMMENDED? Grains White bread. White pasta. White rice. Refined cornbread. Bagels and croissants. Crackers that contain trans fat. Vegetables Creamed or fried vegetables. Vegetables in a cheese sauce. Regular canned vegetables. Regular canned tomato sauce and paste. Regular tomato and vegetable juices. Fruits Dried fruits. Canned fruit in light or heavy syrup. Fruit juice. Meat and Other Protein Products Fatty cuts of meat. Ribs, chicken wings, bacon, sausage, bologna, salami, chitterlings, fatback, hot dogs, bratwurst, and packaged luncheon meats. Salted nuts and seeds. Canned beans with salt. Dairy Whole or 2% milk, cream, half-and-half, and cream cheese. Whole-fat or sweetened yogurt. Full-fat cheeses or blue cheese. Nondairy creamers and whipped toppings. Processed cheese, cheese spreads, or cheese curds. Condiments Onion and garlic salt, seasoned salt, table salt, and sea salt. Canned and packaged  gravies. Worcestershire sauce. Tartar sauce. Barbecue sauce. Teriyaki sauce. Soy sauce, including reduced sodium. Steak sauce. Fish sauce. Oyster sauce. Cocktail sauce. Horseradish. Ketchup and mustard. Meat flavorings and tenderizers. Bouillon cubes. Hot sauce. Tabasco sauce. Marinades. Taco seasonings. Relishes. Fats and Oils Butter, stick margarine, lard, shortening, ghee, and bacon fat. Coconut, palm kernel, or palm oils. Regular salad dressings. Other Pickles and olives. Salted popcorn and pretzels. The items listed above may not be a complete list of foods and beverages to avoid. Contact your dietitian for more information. WHERE CAN I FIND MORE INFORMATION? National Heart, Lung, and Blood Institute: travelstabloid.com Document Released: 07/03/2011 Document Revised: 11/28/2013 Document Reviewed: 05/18/2013 [facility]  Patient Information 2015 [place], [place]. This information is not intended to replace advice given to you by your health care provider. Make sure you discuss any questions you have with your health care provider.

## 2019-12-20 ENCOUNTER — Encounter: Payer: Self-pay | Admitting: Adult Health

## 2019-12-20 DIAGNOSIS — R7989 Other specified abnormal findings of blood chemistry: Secondary | ICD-10-CM | POA: Insufficient documentation

## 2019-12-20 DIAGNOSIS — E538 Deficiency of other specified B group vitamins: Secondary | ICD-10-CM | POA: Insufficient documentation

## 2019-12-20 LAB — COMPLETE METABOLIC PANEL WITH GFR
AG Ratio: 2 (calc) (ref 1.0–2.5)
ALT: 32 U/L — ABNORMAL HIGH (ref 6–29)
AST: 32 U/L (ref 10–35)
Albumin: 4.7 g/dL (ref 3.6–5.1)
Alkaline phosphatase (APISO): 82 U/L (ref 37–153)
BUN: 15 mg/dL (ref 7–25)
CO2: 28 mmol/L (ref 20–32)
Calcium: 9.4 mg/dL (ref 8.6–10.4)
Chloride: 103 mmol/L (ref 98–110)
Creat: 0.72 mg/dL (ref 0.50–1.05)
GFR, Est African American: 108 mL/min/{1.73_m2} (ref >=60)
GFR, Est Non African American: 93 mL/min/{1.73_m2} (ref >=60)
Globulin: 2.4 g/dL (calc) (ref 1.9–3.7)
Glucose, Bld: 88 mg/dL (ref 65–99)
Potassium: 4.2 mmol/L (ref 3.5–5.3)
Sodium: 139 mmol/L (ref 135–146)
Total Bilirubin: 0.5 mg/dL (ref 0.2–1.2)
Total Protein: 7.1 g/dL (ref 6.1–8.1)

## 2019-12-20 LAB — HEMOGLOBIN A1C
Hgb A1c MFr Bld: 5.2 % of total Hgb (ref <5.7)
Mean Plasma Glucose: 103 (calc)
eAG (mmol/L): 5.7 (calc)

## 2019-12-20 LAB — CBC WITH DIFFERENTIAL/PLATELET
Absolute Monocytes: 702 cells/uL (ref 200–950)
Basophils Absolute: 62 cells/uL (ref 0–200)
Basophils Relative: 0.8 %
Eosinophils Absolute: 148 cells/uL (ref 15–500)
Eosinophils Relative: 1.9 %
HCT: 41 % (ref 35.0–45.0)
Hemoglobin: 13.7 g/dL (ref 11.7–15.5)
Lymphs Abs: 2231 cells/uL (ref 850–3900)
MCH: 31 pg (ref 27.0–33.0)
MCHC: 33.4 g/dL (ref 32.0–36.0)
MCV: 92.8 fL (ref 80.0–100.0)
MPV: 9.5 fL (ref 7.5–12.5)
Monocytes Relative: 9 %
Neutro Abs: 4657 cells/uL (ref 1500–7800)
Neutrophils Relative %: 59.7 %
Platelets: 265 10*3/uL (ref 140–400)
RBC: 4.42 10*6/uL (ref 3.80–5.10)
RDW: 11.9 % (ref 11.0–15.0)
Total Lymphocyte: 28.6 %
WBC: 7.8 10*3/uL (ref 3.8–10.8)

## 2019-12-20 LAB — LIPID PANEL
Cholesterol: 198 mg/dL (ref <200)
HDL: 65 mg/dL (ref >=50)
LDL Cholesterol (Calc): 108 mg/dL (calc) — ABNORMAL HIGH
Non-HDL Cholesterol (Calc): 133 mg/dL (calc) — ABNORMAL HIGH (ref <130)
Total CHOL/HDL Ratio: 3 (calc) (ref <5.0)
Triglycerides: 135 mg/dL (ref <150)

## 2019-12-20 LAB — URINALYSIS, ROUTINE W REFLEX MICROSCOPIC
Bacteria, UA: NONE SEEN /HPF
Bilirubin Urine: NEGATIVE
Glucose, UA: NEGATIVE
Hgb urine dipstick: NEGATIVE
Hyaline Cast: NONE SEEN /LPF
Ketones, ur: NEGATIVE
Nitrite: NEGATIVE
Protein, ur: NEGATIVE
RBC / HPF: NONE SEEN /HPF (ref 0–2)
Specific Gravity, Urine: 1.011 (ref 1.001–1.03)
Squamous Epithelial / HPF: NONE SEEN /HPF (ref <=5)
pH: 5.5 (ref 5.0–8.0)

## 2019-12-20 LAB — TSH: TSH: 2.18 mIU/L (ref 0.40–4.50)

## 2019-12-20 LAB — VITAMIN D 25 HYDROXY (VIT D DEFICIENCY, FRACTURES): Vit D, 25-Hydroxy: 56 ng/mL (ref 30–100)

## 2019-12-20 LAB — MAGNESIUM: Magnesium: 1.9 mg/dL (ref 1.5–2.5)

## 2019-12-20 LAB — VITAMIN B12: Vitamin B-12: 358 pg/mL (ref 200–1100)

## 2019-12-21 ENCOUNTER — Encounter: Payer: Self-pay | Admitting: Gastroenterology

## 2020-01-10 ENCOUNTER — Other Ambulatory Visit: Payer: Self-pay | Admitting: Adult Health Nurse Practitioner

## 2020-01-10 DIAGNOSIS — I10 Essential (primary) hypertension: Secondary | ICD-10-CM

## 2020-01-10 MED ORDER — VERAPAMIL HCL ER 120 MG PO TBCR: 240.0000 mg | EXTENDED_RELEASE_TABLET | Freq: Every day | ORAL | 5 refills | Status: DC

## 2020-01-12 ENCOUNTER — Other Ambulatory Visit: Payer: Self-pay | Admitting: Physician Assistant

## 2020-01-12 DIAGNOSIS — I10 Essential (primary) hypertension: Secondary | ICD-10-CM

## 2020-01-12 MED ORDER — VERAPAMIL HCL ER 120 MG PO TBCR: 120.0000 mg | EXTENDED_RELEASE_TABLET | Freq: Two times a day (BID) | ORAL | 0 refills | Status: DC

## 2020-01-27 ENCOUNTER — Other Ambulatory Visit: Payer: Self-pay | Admitting: Adult Health

## 2020-01-27 MED ORDER — NYSTATIN 100000 UNIT/GM EX OINT: 1.0000 "application " | TOPICAL_OINTMENT | Freq: Two times a day (BID) | CUTANEOUS | 0 refills | Status: DC

## 2020-01-27 MED ORDER — TRIAMCINOLONE ACETONIDE 0.1 % EX OINT
1.0000 | TOPICAL_OINTMENT | Freq: Two times a day (BID) | CUTANEOUS | 1 refills | Status: DC
Start: 2020-01-27 — End: 2020-11-19

## 2020-01-30 NOTE — Progress Notes (Signed)
Assessment and Plan:  Samantha Terrell was seen today for follow-up.  Diagnoses and all orders for this visit:  Essential hypertension/ Intermittent palpitations No improvement with veramapil; will try switching to BB Start taking bisoprolol 5 mg daily in the evening, stop verapamil Continue checking daily; if remains above goal in 10-14 days, increase to 2 tabs (10 mg daily) Monitor blood pressure at home; call if consistently over 130/80 Discussed DASH diet Advised to go to the ER if any CP, SOB, nausea, dizziness, severe HA, changes vision/speech, left arm numbness and tingling and jaw pain. Follow up in 4 weeks or sooner if needed -     bisoprolol (ZEBETA) 5 MG tablet; Take 1 tablet (5 mg total) by mouth daily.   Further disposition pending results of labs. Discussed med's effects and SE's.   Over 30 minutes of exam, counseling, chart review, and critical decision making was performed.   Future Appointments  Date Time Provider [place]  03/05/2020  4:00 PM Liane Comber, NP [facility] None  12/18/2020  3:00 PM Liane Comber, NP [facility] None    ------------------------------------------------------------------------------------------------------------------   HPI BP (!) 134/98   Pulse 83   Temp 97.8 F (36.6 C)   Resp 16   Wt 185 lb (83.9 kg)   LMP  (LMP Unknown)   SpO2 99%   BMI 28.98 kg/m   59 y.o.female presents for 1 month follow up on blood pressure/palpitations and perineal irritation symptoms.    Has mainly been seeing Dr. Philis Pique (OB/GYN) and UC, getting estrogen.  She has a mild prolapse with cystocele, was doing therapy, was doing home pelvic exercises but admits hasn't been doing, trying to postpone surgery.  Last week reported external vulval irritation/burning, denied vaginal discharge or urinary sx, OTC yeast suppossitories where unhelpful, was given nystatin and triamcinolone and reports sx resolved quickly with this.   She is on zoloft 50 mg  daily for anxiety and on verapamil for palpitations, has seen Dr. Fidela Juneau, had echo with LVH, atrial septal aneursym. She had been on verapamil 120 mg daily with good control; this was increased to 240 mg daily at last visit due to elevated BP and breakthrough palpitations, and presents for 1 month follow up.   Her blood pressure has not been controlled at home (log demonstrates 140-150s/70-80s), today their BP is BP: (!) 134/98 She denies chest pain, shortness of breath, dizziness.   Past Medical History:  Diagnosis Date  . Absolute anemia 04/24/2015  . Anxiety   . Degenerative disc disease, cervical   . Depression   . Hypertension   . Migraine    history, has not had in years  . PVC (premature ventricular contraction)      Allergies  Allergen Reactions  . Latex Itching    Current Outpatient Medications on File Prior to Visit  Medication Sig  . Biotin 10000 MCG TABS Take by mouth.  . Cyanocobalamin (VITAMIN B-12 PO) Take by mouth.  . Multiple Vitamins-Minerals (HAIR/SKIN/NAILS PO) Take by mouth.  . norethindrone-ethinyl estradiol (FEMHRT 1/5) 1-5 MG-MCG TABS tablet Take 1 tablet by mouth daily.  Marland Kitchen nystatin ointment (MYCOSTATIN) Apply 1 application topically 2 (two) times daily.  . Omega-3 Fatty Acids (FISH OIL PO) Take by mouth.  Marland Kitchen OVER THE COUNTER MEDICATION 5,000 mcg 2 (two) times a day. DHT blocker  . sertraline (ZOLOFT) 100 MG tablet   . triamcinolone ointment (KENALOG) 0.1 % Apply 1 application topically 2 (two) times daily.  . verapamil (CALAN-SR) 120 MG CR tablet Take  1 tablet (120 mg total) by mouth 2 (two) times daily.  . Vitamin D, Cholecalciferol, 1000 UNITS CAPS Take by mouth.   No current facility-administered medications on file prior to visit.    ROS: all negative except above.   Physical Exam:  BP (!) 134/98   Pulse 83   Temp 97.8 F (36.6 C)   Resp 16   Wt 185 lb (83.9 kg)   LMP  (LMP Unknown)   SpO2 99%   BMI 28.98 kg/m   General Appearance: Well  nourished, in no apparent distress. Eyes: PERRLA, EOMs, conjunctiva no swelling or erythema Sinuses: No Frontal/maxillary tenderness ENT/Mouth: Ext aud canals clear, TMs without erythema, bulging. No erythema, swelling, or exudate on post pharynx.  Tonsils not swollen or erythematous. Hearing normal.  Neck: Supple, thyroid normal.  Respiratory: Respiratory effort normal, BS equal bilaterally without rales, rhonchi, wheezing or stridor.  Cardio: RRR with no MRGs. Brisk peripheral pulses without edema.  Abdomen: Soft, + BS.  Non tender, no guarding, rebound, hernias, masses. Lymphatics: Non tender without lymphadenopathy.  Musculoskeletal: Full ROM, 5/5 strength, normal gait.  Skin: Warm, dry without rashes, lesions, ecchymosis.  Neuro: Cranial nerves intact. Normal muscle tone, no cerebellar symptoms. Sensation intact.  Psych: Awake and oriented X 3, normal affect, Insight and Judgment appropriate.     Izora Ribas, NP 4:27 PM [facility]

## 2020-01-31 ENCOUNTER — Encounter: Payer: Self-pay | Admitting: Adult Health

## 2020-01-31 ENCOUNTER — Ambulatory Visit (INDEPENDENT_AMBULATORY_CARE_PROVIDER_SITE_OTHER): Payer: 59 | Admitting: Adult Health

## 2020-01-31 ENCOUNTER — Other Ambulatory Visit: Payer: Self-pay

## 2020-01-31 VITALS — BP 134/98 | HR 83 | Temp 97.8°F | Resp 16 | Wt 185.0 lb

## 2020-01-31 DIAGNOSIS — R002 Palpitations: Secondary | ICD-10-CM

## 2020-01-31 DIAGNOSIS — I10 Essential (primary) hypertension: Secondary | ICD-10-CM

## 2020-01-31 MED ORDER — BISOPROLOL FUMARATE 5 MG PO TABS: 5.0000 mg | ORAL_TABLET | Freq: Every day | ORAL | 0 refills | Status: DC

## 2020-01-31 NOTE — Patient Instructions (Addendum)
Goals    . Blood Pressure < 130/80    . LDL CALC < 130    . Reduce alcohol intake     Aim to reduce to 1 drink/day       Stop verapamil - start taking bisoprolol 5 mg daily for blood pressure and palpitations  Probably best in the evening   In 10-14 days if still running 130/80+, then increase to take 10 mg (2 tabs daily)    Bisoprolol Tablets What is this medicine? BISOPROLOL (bis OH proe lol) is a beta blocker. It decreases the amount of work your heart has to do and helps your heart beat regularly. It is used to treat high blood pressure. This medicine may be used for other purposes; ask your health care provider or pharmacist if you have questions. COMMON BRAND NAME(S): Zebeta What should I tell my health care provider before I take this medicine? They need to know if you have any of these conditions:  chest pain (angina)  diabetes  heart or vessel disease like slow heart rate, worsening heart failure, heart block, sick sinus syndrome or Raynaud's disease  kidney disease  liver disease  lung or breathing disease, like asthma or emphysema  pheochromocytoma  thyroid disease  an unusual or allergic reaction to bisoprolol, other beta-blockers, medicines, foods, dyes, or preservatives  pregnant or trying to get pregnant  breast-feeding How should I use this medicine? Take this drug by mouth. Take it as directed on the prescription label at the same time every day. You can take it with or without food. If it upsets your stomach, take it with food. Keep taking it unless your health care provider tells you to stop. Talk to your health care provider about the use of this drug in children. Special care may be needed. Overdosage: If you think you have taken too much of this medicine contact a poison control center or emergency room at once. NOTE: This medicine is only for you. Do not share this medicine with others. What if I miss a dose? If you miss a dose, take it as  soon as you can. If it is almost time for your next dose, take only that dose. Do not take double or extra doses. What may interact with this medicine? This medicine may interact with the following medications:  certain medicines for blood pressure, heart disease, irregular heart beat  NSAIDs, medicines for pain and inflammation, like ibuprofen or naproxen  rifampin This list may not describe all possible interactions. Give your health care provider a list of all the medicines, herbs, non-prescription drugs, or dietary supplements you use. Also tell them if you smoke, drink alcohol, or use illegal drugs. Some items may interact with your medicine. What should I watch for while using this medicine? Visit your doctor or health care professional for regular checks on your progress. Check your heart rate and blood pressure regularly while you are taking this medicine. Ask your doctor or health care professional what your heart rate and blood pressure should be, and when you should contact him or her. You may get drowsy or dizzy. Do not drive, use machinery, or do anything that needs mental alertness until you know how this drug affects you. Do not stand or sit up quickly, especially if you are an older patient. This reduces the risk of dizzy or fainting spells. Alcohol can make you more drowsy and dizzy. Avoid alcoholic drinks. This medicine may increase blood sugar. Ask your healthcare provider  if changes in diet or medicines are needed if you have diabetes. Do not treat yourself for coughs, colds, or pain while you are taking this medicine without asking your doctor or health care professional for advice. Some ingredients may increase your blood pressure. What side effects may I notice from receiving this medicine? Side effects that you should report to your doctor or health care professional as soon as possible:  allergic reactions like skin rash, itching or hives, swelling of the face, lips, or  tongue  breathing problems  chest pain  cold, tingling, or numb hands or feet  confusion  irregular, slow heartbeat  muscle aches and pains   signs and symptoms of high blood sugar such as being more thirsty or hungry or having to urinate more than normal. You may also feel very tired or have blurry vision.  sweating  swollen legs or ankles  tremors  vomiting Side effects that usually do not require medical attention (report to your doctor or health care professional if they continue or are bothersome):  anxiety  change in sex drive or performance  depression  diarrhea  dry or burning eyes  headache  nausea This list may not describe all possible side effects. Call your doctor for medical advice about side effects. You may report side effects to FDA at 1-800-FDA-1088. Where should I keep my medicine? Keep out of the reach of children and pets. Store at room temperature between 20 and 25 degrees C (68 and 77 degrees F). Protect from light and moisture. Keep the container tightly closed. Throw away any unused drug after the expiration date. NOTE: This sheet is a summary. It may not cover all possible information. If you have questions about this medicine, talk to your doctor, pharmacist, or health care provider.  2020 Elsevier/Gold Standard (2019-02-24 16:46:40)     HYPERTENSION INFORMATION  Monitor your blood pressure at home, please keep a record and bring that in with you to your next office visit.   Go to the ER if any CP, SOB, nausea, dizziness, severe HA, changes vision/speech  Testing/Procedures: HOW TO TAKE YOUR BLOOD PRESSURE:  Rest 5 minutes before taking your blood pressure.  Don't smoke or drink caffeinated beverages for at least 30 minutes before.  Take your blood pressure before (not after) you eat.  Sit comfortably with your back supported and both feet on the floor (don't cross your legs).  Elevate your arm to heart level on a table or  a desk.  Use the proper sized cuff. It should fit smoothly and snugly around your bare upper arm. There should be enough room to slip a fingertip under the cuff. The bottom edge of the cuff should be 1 inch above the crease of the elbow.  Your most recent BP: BP: (!) 134/98   Take your medications faithfully as instructed. Maintain a healthy weight. Get at least 150 minutes of aerobic exercise per week. Minimize salt intake. Minimize alcohol intake  DASH Eating Plan DASH stands for "Dietary Approaches to Stop Hypertension." The DASH eating plan is a healthy eating plan that has been shown to reduce high blood pressure (hypertension). Additional health benefits may include reducing the risk of type 2 diabetes mellitus, heart disease, and stroke. The DASH eating plan may also help with weight loss. WHAT DO I NEED TO KNOW ABOUT THE DASH EATING PLAN? For the DASH eating plan, you will follow these general guidelines:  Choose foods with a percent daily value for sodium of  less than 5% (as listed on the food label).  Use salt-free seasonings or herbs instead of table salt or sea salt.  Check with your health care provider or pharmacist before using salt substitutes.  Eat lower-sodium products, often labeled as "lower sodium" or "no salt added."  Eat fresh foods.  Eat more vegetables, fruits, and low-fat dairy products.  Choose whole grains. Look for the word "whole" as the first word in the ingredient list.  Choose fish and skinless chicken or [place] more often than red meat. Limit fish, poultry, and meat to 6 oz (170 g) each day.  Limit sweets, desserts, sugars, and sugary drinks.  Choose heart-healthy fats.  Limit cheese to 1 oz (28 g) per day.  Eat more home-cooked food and less restaurant, buffet, and fast food.  Limit fried foods.  Cook foods using methods other than frying.  Limit canned vegetables. If you do use them, rinse them well to decrease the sodium.  When  eating at a restaurant, ask that your food be prepared with less salt, or no salt if possible. WHAT FOODS CAN I EAT? Seek help from a dietitian for individual calorie needs. Grains Whole grain or whole wheat bread. Brown rice. Whole grain or whole wheat pasta. Quinoa, bulgur, and whole grain cereals. Low-sodium cereals. Corn or whole wheat flour tortillas. Whole grain cornbread. Whole grain crackers. Low-sodium crackers. Vegetables Fresh or frozen vegetables (raw, steamed, roasted, or grilled). Low-sodium or reduced-sodium tomato and vegetable juices. Low-sodium or reduced-sodium tomato sauce and paste. Low-sodium or reduced-sodium canned vegetables.  Fruits All fresh, canned (in natural juice), or frozen fruits. Meat and Other Protein Products Ground beef (85% or leaner), grass-fed beef, or beef trimmed of fat. Skinless chicken or [place]. Ground chicken or [place]. Pork trimmed of fat. All fish and seafood. Eggs. Dried beans, peas, or lentils. Unsalted nuts and seeds. Unsalted canned beans. Dairy Low-fat dairy products, such as skim or 1% milk, 2% or reduced-fat cheeses, low-fat ricotta or cottage cheese, or plain low-fat yogurt. Low-sodium or reduced-sodium cheeses. Fats and Oils Tub margarines without trans fats. Light or reduced-fat mayonnaise and salad dressings (reduced sodium). Avocado. Safflower, olive, or canola oils. Natural peanut or almond butter. Other Unsalted popcorn and pretzels. The items listed above may not be a complete list of recommended foods or beverages. Contact your dietitian for more options. WHAT FOODS ARE NOT RECOMMENDED? Grains White bread. White pasta. White rice. Refined cornbread. Bagels and croissants. Crackers that contain trans fat. Vegetables Creamed or fried vegetables. Vegetables in a cheese sauce. Regular canned vegetables. Regular canned tomato sauce and paste. Regular tomato and vegetable juices. Fruits Dried fruits. Canned fruit in light or heavy  syrup. Fruit juice. Meat and Other Protein Products Fatty cuts of meat. Ribs, chicken wings, bacon, sausage, bologna, salami, chitterlings, fatback, hot dogs, bratwurst, and packaged luncheon meats. Salted nuts and seeds. Canned beans with salt. Dairy Whole or 2% milk, cream, half-and-half, and cream cheese. Whole-fat or sweetened yogurt. Full-fat cheeses or blue cheese. Nondairy creamers and whipped toppings. Processed cheese, cheese spreads, or cheese curds. Condiments Onion and garlic salt, seasoned salt, table salt, and sea salt. Canned and packaged gravies. Worcestershire sauce. Tartar sauce. Barbecue sauce. Teriyaki sauce. Soy sauce, including reduced sodium. Steak sauce. Fish sauce. Oyster sauce. Cocktail sauce. Horseradish. Ketchup and mustard. Meat flavorings and tenderizers. Bouillon cubes. Hot sauce. Tabasco sauce. Marinades. Taco seasonings. Relishes. Fats and Oils Butter, stick margarine, lard, shortening, ghee, and bacon fat. Coconut, palm kernel, or palm oils.  Regular salad dressings. Other Pickles and olives. Salted popcorn and pretzels. The items listed above may not be a complete list of foods and beverages to avoid. Contact your dietitian for more information. WHERE CAN I FIND MORE INFORMATION? National Heart, Lung, and Blood Institute: travelstabloid.com Document Released: 07/03/2011 Document Revised: 11/28/2013 Document Reviewed: 05/18/2013 [facility] Patient Information 2015 [place], [place]. This information is not intended to replace advice given to you by your health care provider. Make sure you discuss any questions you have with your health care provider.

## 2020-02-20 ENCOUNTER — Encounter: Payer: 59 | Admitting: Gastroenterology

## 2020-03-02 NOTE — Progress Notes (Signed)
Assessment and Plan:  Samantha Terrell was seen today for follow-up.  Diagnoses and all orders for this visit:  Essential hypertension/ Intermittent palpitations Still taking verapamil; BP much improved but borderline elevated per home values Increase bisoprolol to 10 mg daily, stop verapamil Continue checking daily; message back in 2 weeks with PBs, pulses and if any palpiations Monitor blood pressure at home; call if consistently over 130/80 Discussed DASH diet Advised to go to the ER if any CP, SOB, nausea, dizziness, severe HA, changes vision/speech, left arm numbness and tingling and jaw pain. Follow up in 4 weeks or sooner if needed -     bisoprolol (ZEBETA) 10 MG tablet; Take 1 tablet (10 mg total) by mouth daily.   Further disposition pending results of labs. Discussed med's effects and SE's.   Over 30 minutes of exam, counseling, chart review, and critical decision making was performed.   Future Appointments  Date Time Provider [place]  12/18/2020  3:00 PM Liane Comber, NP [facility] None    ------------------------------------------------------------------------------------------------------------------   HPI BP 130/76   Pulse (!) 52   Temp (!) 97.3 F (36.3 C)   Wt 184 lb (83.5 kg)   LMP  (LMP Unknown)   SpO2 98%   BMI 28.82 kg/m   59 y.o.female presents for 1 month follow up on blood pressure/palpitations.  She is on zoloft 50 mg daily for anxiety and on verapamil for palpitations, has seen Dr. Fidela Juneau, had echo with LVH, atrial septal aneursym. She had been on verapamil 120 mg daily with good control; however with elevated BPs, was initiated on bisoprolol 5 mg daily. She does report significantly improved, has continued with verapamil 120 mg.   Her blood pressure has not been controlled at home (log demonstrates 130-140s/70-80s), today their BP is BP: 130/76 She denies chest pain, shortness of breath, dizziness.      Past Medical History:  Diagnosis  Date  . Absolute anemia 04/24/2015  . Anxiety   . Degenerative disc disease, cervical   . Depression   . Hypertension   . Migraine    history, has not had in years  . PVC (premature ventricular contraction)      Allergies  Allergen Reactions  . Latex Itching    Current Outpatient Medications on File Prior to Visit  Medication Sig  . aspirin EC 81 MG tablet Take 81 mg by mouth daily. Swallow whole.  . bisoprolol (ZEBETA) 5 MG tablet Take 1 tablet (5 mg total) by mouth daily.  . Cyanocobalamin (VITAMIN B-12 PO) Take by mouth.  . Magnesium 250 MG TABS Take by mouth daily.  . norethindrone-ethinyl estradiol (FEMHRT 1/5) 1-5 MG-MCG TABS tablet Take 1 tablet by mouth daily.  . Omega-3 Fatty Acids (FISH OIL PO) Take by mouth.  Marland Kitchen OVER THE COUNTER MEDICATION 5,000 mcg 2 (two) times a day. DHT blocker  . sertraline (ZOLOFT) 100 MG tablet   . verapamil (CALAN) 120 MG tablet Take 120 mg by mouth at bedtime.  . Vitamin D, Cholecalciferol, 1000 UNITS CAPS Take by mouth.  . Zinc 50 MG TABS Take by mouth daily.  Marland Kitchen nystatin ointment (MYCOSTATIN) Apply 1 application topically 2 (two) times daily. (Patient not taking: Reported on 03/05/2020)  . triamcinolone ointment (KENALOG) 0.1 % Apply 1 application topically 2 (two) times daily. (Patient not taking: Reported on 03/05/2020)   No current facility-administered medications on file prior to visit.    ROS: all negative except above.   Physical Exam:  BP 130/76  Pulse (!) 52   Temp (!) 97.3 F (36.3 C)   Wt 184 lb (83.5 kg)   LMP  (LMP Unknown)   SpO2 98%   BMI 28.82 kg/m   General Appearance: Well nourished, in no apparent distress. Eyes: PERRLA, EOMs, conjunctiva no swelling or erythema Sinuses: No Frontal/maxillary tenderness ENT/Mouth: Ext aud canals clear, TMs without erythema, bulging. No erythema, swelling, or exudate on post pharynx.  Tonsils not swollen or erythematous. Hearing normal.  Neck: Supple, thyroid normal.  Respiratory:  Respiratory effort normal, BS equal bilaterally without rales, rhonchi, wheezing or stridor.  Cardio: RRR with no MRGs. Brisk peripheral pulses without edema.  Abdomen: Soft, + BS.  Non tender, no guarding, rebound, hernias, masses. Lymphatics: Non tender without lymphadenopathy.  Musculoskeletal: Full ROM, 5/5 strength, normal gait.  Skin: Warm, dry without rashes, lesions, ecchymosis.  Neuro: Cranial nerves intact. Normal muscle tone, no cerebellar symptoms. Sensation intact.  Psych: Awake and oriented X 3, normal affect, Insight and Judgment appropriate.     Izora Ribas, NP 4:27 PM [facility]

## 2020-03-05 ENCOUNTER — Ambulatory Visit (INDEPENDENT_AMBULATORY_CARE_PROVIDER_SITE_OTHER): Payer: 59 | Admitting: Adult Health

## 2020-03-05 ENCOUNTER — Other Ambulatory Visit: Payer: Self-pay

## 2020-03-05 ENCOUNTER — Encounter: Payer: Self-pay | Admitting: Adult Health

## 2020-03-05 VITALS — BP 130/76 | HR 52 | Temp 97.3°F | Wt 184.0 lb

## 2020-03-05 DIAGNOSIS — Z79899 Other long term (current) drug therapy: Secondary | ICD-10-CM | POA: Diagnosis not present

## 2020-03-05 DIAGNOSIS — R002 Palpitations: Secondary | ICD-10-CM

## 2020-03-05 DIAGNOSIS — E663 Overweight: Secondary | ICD-10-CM

## 2020-03-05 DIAGNOSIS — R7989 Other specified abnormal findings of blood chemistry: Secondary | ICD-10-CM

## 2020-03-05 DIAGNOSIS — I10 Essential (primary) hypertension: Secondary | ICD-10-CM

## 2020-03-05 LAB — HEPATIC FUNCTION PANEL
AG Ratio: 2 (calc) (ref 1.0–2.5)
ALT: 18 U/L (ref 6–29)
AST: 16 U/L (ref 10–35)
Albumin: 4.4 g/dL (ref 3.6–5.1)
Alkaline phosphatase (APISO): 76 U/L (ref 37–153)
Bilirubin, Direct: 0.1 mg/dL (ref 0.0–0.2)
Globulin: 2.2 g/dL (calc) (ref 1.9–3.7)
Indirect Bilirubin: 0.3 mg/dL (calc) (ref 0.2–1.2)
Total Bilirubin: 0.4 mg/dL (ref 0.2–1.2)
Total Protein: 6.6 g/dL (ref 6.1–8.1)

## 2020-03-05 MED ORDER — BISOPROLOL FUMARATE 10 MG PO TABS: 10.0000 mg | ORAL_TABLET | Freq: Every day | ORAL | 1 refills | Status: DC

## 2020-03-05 NOTE — Patient Instructions (Addendum)
Increase bisoprolol to 10 mg daily   Then stop verapamil and monitor for 10-14 days   Contact me in 10-14 days with blood pressure, pulse and if you've had any palpitations     Bradycardia, Adult Bradycardia is a slower-than-normal heartbeat. A normal resting heart rate for an adult ranges from 60 to 100 beats per minute. With bradycardia, the resting heart rate is less than 60 beats per minute. Bradycardia can prevent enough oxygen from reaching certain areas of your body when you are active. It can be serious if it keeps enough oxygen from reaching your brain and other parts of your body. Bradycardia is not a problem for everyone. For some healthy adults, a slow resting heart rate is normal. What are the causes? This condition may be caused by:  A problem with the heart, including:   ? A problem with the heart's electrical system, such as a heart block. With a heart block, electrical signals between the chambers of the heart are partially or completely blocked, so they are not able to work as they should. ? A problem with the heart's natural pacemaker (sinus node). ? Heart disease. ? A heart attack. ? Heart damage. ? Lyme disease. ? A heart infection. ? A heart condition that is present at birth (congenital heart defect).  Certain medicines that treat heart conditions.  Certain conditions, such as hypothyroidism and obstructive sleep apnea.  Problems with the balance of chemicals and other substances, like potassium, in the blood.  Trauma.  Radiation therapy. What increases the risk? You are more likely to develop this condition if you:  Are age 76 or older.  Have high blood pressure (hypertension), high cholesterol (hyperlipidemia), or diabetes.  Drink heavily, use tobacco or nicotine products, or use drugs. What are the signs or symptoms? Symptoms of this condition include:  Light-headedness.  Feeling faint or fainting.  Fatigue and weakness.  Trouble with  activity or exercise.  Shortness of breath.  Chest pain (angina).  Drowsiness.  Confusion.  Dizziness. How is this diagnosed? This condition may be diagnosed based on:  Your symptoms.  Your medical history.  A physical exam. During the exam, your health care provider will listen to your heartbeat and check your pulse. To confirm the diagnosis, your health care provider may order tests, such as:  Blood tests.  An electrocardiogram (ECG). This test records the heart's electrical activity. The test can show how fast your heart is beating and whether the heartbeat is steady.  A test in which you wear a portable device (event recorder or Holter monitor) to record your heart's electrical activity while you go about your day.  Anexercise test. How is this treated? Treatment for this condition depends on the cause of the condition and how severe your symptoms are. Treatment may involve:  Treatment of the underlying condition.  Changing your medicines or how much medicine you take.  Having a small, battery-operated device called a pacemaker implanted under the skin. When bradycardia occurs, this device can be used to increase your heart rate and help your heart beat in a regular rhythm. Follow these instructions at home: Lifestyle   Manage any health conditions that contribute to bradycardia as told by your health care provider.  Follow a heart-healthy diet. A nutrition specialist (dietitian) can help educate you about healthy food options and changes.  Follow an exercise program that is approved by your health care provider.  Maintain a healthy weight.  Try to reduce or manage your stress,  such as with yoga or meditation. If you need help reducing stress, ask your health care provider.  Do not use any products that contain nicotine or tobacco, such as cigarettes, e-cigarettes, and chewing tobacco. If you need help quitting, ask your health care provider.  Do not use  illegal drugs.  Limit alcohol intake to no more than 1 drink a day for nonpregnant women and 2 drinks a day for men. Be aware of how much alcohol is in your drink. In the [place] one drink equals one 12 oz bottle of beer (355 mL), one 5 oz glass of wine (148 mL), or one 1 oz glass of hard liquor (44 mL). General instructions  Take over-the-counter and prescription medicines only as told by your health care provider.  Keep all follow-up visits as told by your health care provider. This is important. How is this prevented? In some cases, bradycardia may be prevented by:  Treating underlying medical problems.  Stopping behaviors or medicines that can trigger the condition. Contact a health care provider if you:  Feel light-headed or dizzy.  Almost faint.  Feel weak or are easily fatigued during physical activity.  Experience confusion or have memory problems. Get help right away if:  You faint.  You have: ? An irregular heartbeat (palpitations). ? Chest pain. ? Trouble breathing. Summary  Bradycardia is a slower-than-normal heartbeat. With bradycardia, the resting heart rate is less than 60 beats per minute.  Treatment for this condition depends on the cause.  Manage any health conditions that contribute to bradycardia as told by your health care provider.  Do not use any products that contain nicotine or tobacco, such as cigarettes, e-cigarettes, and chewing tobacco, and limit alcohol intake.  Keep all follow-up visits as told by your health care provider. This is important. This information is not intended to replace advice given to you by your health care provider. Make sure you discuss any questions you have with your health care provider. Document Revised: 01/25/2018 Document Reviewed: 12/23/2017 Elsevier Patient Education  [place].

## 2020-03-16 DIAGNOSIS — I10 Essential (primary) hypertension: Secondary | ICD-10-CM

## 2020-03-17 MED ORDER — BISOPROLOL FUMARATE 10 MG PO TABS: ORAL_TABLET | ORAL | 1 refills | Status: DC

## 2020-03-20 ENCOUNTER — Other Ambulatory Visit: Payer: Self-pay | Admitting: Physician Assistant

## 2020-03-20 DIAGNOSIS — I10 Essential (primary) hypertension: Secondary | ICD-10-CM

## 2020-03-23 ENCOUNTER — Other Ambulatory Visit: Payer: Self-pay | Admitting: Adult Health

## 2020-03-23 DIAGNOSIS — I10 Essential (primary) hypertension: Secondary | ICD-10-CM

## 2020-04-10 ENCOUNTER — Other Ambulatory Visit: Payer: 59

## 2020-04-10 ENCOUNTER — Other Ambulatory Visit: Payer: Self-pay | Admitting: *Deleted

## 2020-04-10 ENCOUNTER — Other Ambulatory Visit: Payer: Self-pay

## 2020-04-10 DIAGNOSIS — Z20822 Contact with and (suspected) exposure to covid-19: Secondary | ICD-10-CM

## 2020-04-12 LAB — SARS-COV-2, NAA 2 DAY TAT

## 2020-04-12 LAB — NOVEL CORONAVIRUS, NAA: SARS-CoV-2, NAA: NOT DETECTED

## 2020-08-06 ENCOUNTER — Other Ambulatory Visit: Payer: Self-pay | Admitting: Adult Health Nurse Practitioner

## 2020-08-06 DIAGNOSIS — I10 Essential (primary) hypertension: Secondary | ICD-10-CM

## 2020-09-04 ENCOUNTER — Other Ambulatory Visit: Payer: Self-pay | Admitting: Adult Health Nurse Practitioner

## 2020-09-04 DIAGNOSIS — I10 Essential (primary) hypertension: Secondary | ICD-10-CM

## 2020-11-03 ENCOUNTER — Other Ambulatory Visit: Payer: Self-pay | Admitting: Adult Health

## 2020-11-03 DIAGNOSIS — I10 Essential (primary) hypertension: Secondary | ICD-10-CM

## 2020-11-19 ENCOUNTER — Ambulatory Visit (AMBULATORY_SURGERY_CENTER): Payer: Self-pay | Admitting: *Deleted

## 2020-11-19 ENCOUNTER — Other Ambulatory Visit: Payer: Self-pay

## 2020-11-19 VITALS — Ht 67.0 in | Wt 192.0 lb

## 2020-11-19 DIAGNOSIS — Z86010 Personal history of colonic polyps: Secondary | ICD-10-CM

## 2020-11-19 MED ORDER — SUTAB 1479-225-188 MG PO TABS: 1.0000 | ORAL_TABLET | ORAL | 0 refills | Status: DC

## 2020-11-19 NOTE — Progress Notes (Signed)
Patient is here in-person for PV. Patient denies any allergies to eggs or soy. Patient denies any problems with anesthesia/sedation. Patient denies any oxygen use at home. Patient denies taking any diet/weight loss medications or blood thinners. Patient is not being treated for MRSA or C-diff. Patient is aware of our care-partner policy and Covid-19 safety protocol. EMMI education assigned to the patient for the procedure, sent to MyChart.   Patient is fully COVID-19 vaccinated, per patient.   Prep Prescription coupon given to the patient. 

## 2020-11-25 DIAGNOSIS — Z8616 Personal history of COVID-19: Secondary | ICD-10-CM

## 2020-11-25 HISTORY — DX: Personal history of COVID-19: Z86.16

## 2020-11-30 ENCOUNTER — Encounter: Payer: Self-pay | Admitting: Gastroenterology

## 2020-12-03 ENCOUNTER — Encounter: Payer: Self-pay | Admitting: Gastroenterology

## 2020-12-03 ENCOUNTER — Other Ambulatory Visit: Payer: Self-pay

## 2020-12-03 ENCOUNTER — Other Ambulatory Visit: Payer: Self-pay | Admitting: Gastroenterology

## 2020-12-03 ENCOUNTER — Ambulatory Visit (AMBULATORY_SURGERY_CENTER): Payer: 59 | Admitting: Gastroenterology

## 2020-12-03 VITALS — BP 135/79 | HR 55 | Temp 97.7°F | Resp 15 | Ht 67.0 in | Wt 192.0 lb

## 2020-12-03 DIAGNOSIS — K621 Rectal polyp: Secondary | ICD-10-CM | POA: Diagnosis not present

## 2020-12-03 DIAGNOSIS — D129 Benign neoplasm of anus and anal canal: Secondary | ICD-10-CM

## 2020-12-03 DIAGNOSIS — Z860100 Personal history of colon polyps, unspecified: Secondary | ICD-10-CM

## 2020-12-03 DIAGNOSIS — D128 Benign neoplasm of rectum: Secondary | ICD-10-CM

## 2020-12-03 DIAGNOSIS — D120 Benign neoplasm of cecum: Secondary | ICD-10-CM

## 2020-12-03 HISTORY — PX: COLONOSCOPY WITH PROPOFOL: SHX5780

## 2020-12-03 MED ORDER — SODIUM CHLORIDE 0.9 % IV SOLN: 500.0000 mL | Freq: Once | INTRAVENOUS | Status: DC

## 2020-12-03 NOTE — Progress Notes (Signed)
Called to room to assist during endoscopic procedure.  Patient ID and intended procedure confirmed with present staff. Received instructions for my participation in the procedure from the performing physician.  

## 2020-12-03 NOTE — Progress Notes (Signed)
Pt's states no medical or surgical changes since previsit or office visit. 

## 2020-12-03 NOTE — Patient Instructions (Signed)
Handouts Provided:  Polyps  YOU HAD AN ENDOSCOPIC PROCEDURE TODAY AT THE  ENDOSCOPY CENTER:   Refer to the procedure report that was given to you for any specific questions about what was found during the examination.  If the procedure report does not answer your questions, please call your gastroenterologist to clarify.  If you requested that your care partner not be given the details of your procedure findings, then the procedure report has been included in a sealed envelope for you to review at your convenience later.  YOU SHOULD EXPECT: Some feelings of bloating in the abdomen. Passage of more gas than usual.  Walking can help get rid of the air that was put into your GI tract during the procedure and reduce the bloating. If you had a lower endoscopy (such as a colonoscopy or flexible sigmoidoscopy) you may notice spotting of blood in your stool or on the toilet paper. If you underwent a bowel prep for your procedure, you may not have a normal bowel movement for a few days.  Please Note:  You might notice some irritation and congestion in your nose or some drainage.  This is from the oxygen used during your procedure.  There is no need for concern and it should clear up in a day or so.  SYMPTOMS TO REPORT IMMEDIATELY:   Following lower endoscopy (colonoscopy or flexible sigmoidoscopy):  Excessive amounts of blood in the stool  Significant tenderness or worsening of abdominal pains  Swelling of the abdomen that is new, acute  Fever of 100F or higher  For urgent or emergent issues, a gastroenterologist can be reached at any hour by calling [phone]. Do not use MyChart messaging for urgent concerns.    DIET:  We do recommend a small meal at first, but then you may proceed to your regular diet.  Drink plenty of fluids but you should avoid alcoholic beverages for 24 hours.  ACTIVITY:  You should plan to take it easy for the rest of today and you should NOT DRIVE or use heavy  machinery until tomorrow (because of the sedation medicines used during the test).    FOLLOW UP: Our staff will call the number listed on your records 48-72 hours following your procedure to check on you and address any questions or concerns that you may have regarding the information given to you following your procedure. If we do not reach you, we will leave a message.  We will attempt to reach you two times.  During this call, we will ask if you have developed any symptoms of COVID 19. If you develop any symptoms (ie: fever, flu-like symptoms, shortness of breath, cough etc.) before then, please call [phone].  If you test positive for Covid 19 in the 2 weeks post procedure, please call and report this information to us.    If any biopsies were taken you will be contacted by phone or by letter within the next 1-3 weeks.  Please call us at [phone] if you have not heard about the biopsies in 3 weeks.    SIGNATURES/CONFIDENTIALITY: You and/or your care partner have signed paperwork which will be entered into your electronic medical record.  These signatures attest to the fact that that the information above on your After Visit Summary has been reviewed and is understood.  Full responsibility of the confidentiality of this discharge information lies with you and/or your care-partner.  

## 2020-12-03 NOTE — Progress Notes (Signed)
pt tolerated well. VSS. awake and to recovery. Report given to RN.  

## 2020-12-03 NOTE — Op Note (Signed)
[facility] Patient Name: Samantha Terrell Procedure Date: 12/03/2020 11:48 AM MRN: [id number] Endoscopist: Tressia Danas MD, MD Age: 60 Referring MD:  Date of Birth: [birth year] Gender: Female Account #: [id number] Procedure:                Colonoscopy Indications:              Surveillance: Personal history of adenomatous                            polyps on last colonoscopy > 5 years ago                           2.0 cm cecal polyp removed on colonoscopy in 2016                            with Dr. Juanda Chance Medicines:                Monitored Anesthesia Care Procedure:                Pre-Anesthesia Assessment:                           - Prior to the procedure, a History and Physical                            was performed, and patient medications and                            allergies were reviewed. The patient's tolerance of                            previous anesthesia was also reviewed. The risks                            and benefits of the procedure and the sedation                            options and risks were discussed with the patient.                            All questions were answered, and informed consent                            was obtained. Prior Anticoagulants: The patient has                            taken no previous anticoagulant or antiplatelet                            agents. ASA Grade Assessment: II - A patient with                            mild systemic disease. After reviewing the risks  and benefits, the patient was deemed in                            satisfactory condition to undergo the procedure.                           After obtaining informed consent, the colonoscope                            was passed under direct vision. Throughout the                            procedure, the patient's blood pressure, pulse, and                            oxygen saturations were monitored continuously. The                             Olympus CF-HQ190L [id number]) Colonoscope was                            introduced through the anus and advanced to the the                            cecum, identified by appendiceal orifice and                            ileocecal valve. The colonoscopy was performed                            without difficulty. The patient tolerated the                            procedure well. The quality of the bowel                            preparation was good. The terminal ileum, ileocecal                            valve, appendiceal orifice, and rectum were                            photographed. The cecal polyp was photographed but                            the image was not captured prior to polypectomy. Scope In: 11:57:09 AM Scope Out: 12:15:07 PM Scope Withdrawal Time: 0 hours 12 minutes 12 seconds  Total Procedure Duration: 0 hours 17 minutes 58 seconds  Findings:                 The perianal and digital rectal examinations were                            normal.  A 4 mm polyp was found in the rectum. The polyp was                            sessile. The polyp was removed with a cold snare.                            Resection and retrieval were complete. Estimated                            blood loss was minimal.                           A less than 1 mm polyp was found in the cecum. The                            polyp was flat. The polyp was removed with a cold                            snare. Resection and retrieval were complete.                            Estimated blood loss was minimal.                           A 18 mm serpiginous polyp was found in the cecum.                            The polyp was flat. The polyp was removed with a                            piecemeal technique using a cold snare. Resection                            and retrieval were complete. Estimated blood loss                            was  minimal.                           The exam was otherwise without abnormality on                            direct and retroflexion views. Complications:            No immediate complications. Estimated blood loss:                            Minimal. Estimated Blood Loss:     Estimated blood loss was minimal. Impression:               - One 4 mm polyp in the rectum, removed with a cold                            snare.  Resected and retrieved.                           - One less than 1 mm polyp in the cecum, removed                            with a cold snare. Resected and retrieved.                           - One 18 mm polyp in the cecum, removed piecemeal                            using a cold snare. Resected and retrieved.                           - The examination was otherwise normal on direct                            and retroflexion views. Recommendation:           - Patient has a contact number available for                            emergencies. The signs and symptoms of potential                            delayed complications were discussed with the                            patient. Return to normal activities tomorrow.                            Written discharge instructions were provided to the                            patient.                           - Resume previous diet.                           - Continue present medications.                           - Await pathology results.                           - Repeat colonoscopy in 6 months to review the                            polypectomy site.                           - Emerging evidence supports eating a diet of  fruits, vegetables, grains, calcium, and yogurt                            while reducing red meat and alcohol may reduce the                            risk of colon cancer.                           - Given these results, all first degree relatives                             (brothers, sisters, children, parents) should start                            colon cancer screening at age 74.                           - Thank you for allowing me to be involved in your                            colon cancer prevention. Tressia Danas MD, MD 12/03/2020 12:24:53 PM This report has been signed electronically.

## 2020-12-05 ENCOUNTER — Telehealth: Payer: Self-pay

## 2020-12-05 NOTE — Telephone Encounter (Signed)
No answer, left message to call back later today, B.Vaughn Frieze RN. 

## 2020-12-05 NOTE — Telephone Encounter (Signed)
Second post procedure follow up call, no answer 

## 2020-12-11 ENCOUNTER — Encounter: Payer: 59 | Admitting: Adult Health

## 2020-12-17 NOTE — Progress Notes (Deleted)
Complete Physical  Assessment and Plan:  Encounter for routine adult medical exam with abnormal findings UTD on health maintenance;  Continue follow up with GYN for mammogram/pelvic ***  Essential hypertension Increase verapamil to 240 mg, follow up 1 month. If remains sig elevated plan to switch to BB for improved BP control  Monitor blood pressure at home; call if consistently over 130/80 Continue DASH diet.   Reminder to go to the ER if any CP, SOB, nausea, dizziness, severe HA, changes vision/speech, left arm numbness and tingling and jaw pain. -     EKG 12-Lead -     Magnesium  Intermittent palpitations Triggered by stress; on bisoprolol ***, recently with some breakthrough -   Atrial septal aneurysm Has been worked up by Dr. Wynonia Lawman; monitor; follow up cardiology as needed  Vitamin D deficiency Continue supplementation Check vitamin D level -     VITAMIN D 25 Hydroxy (Vit-D Deficiency, Fractures)  Medication management -     CBC with Differential/Platelet -     COMPLETE METABOLIC PANEL WITH GFR -     Urinalysis w microscopic + reflex cultur -     Magnesium  Hyperlipidemia, unspecified hyperlipidemia type Continue fiber supplement Continue low cholesterol diet and exercise.  Check lipid panel today -     Lipid panel -     TSH  Recurrent major depressive disorder, in full remission (HCC)/ Anxiety Controlled; Continue zoloft Lifestyle discussed: diet/exerise, sleep hygiene, stress management, hydration  Screening for diabetes mellitus -    A1C ***  Screening for hematuria or proteinuria -     Urinalysis w microscopic   Obesity - BMI *** Long discussion about weight loss, diet, and exercise Recommended diet heavy in fruits and veggies and low in animal meats, cheeses, and dairy products, appropriate calorie intake Discussed appropriate weight for height Follow up at next visit  Pelvic prolapse with cystocele Per Dr. Ulanda Edison; was doing PT; doing exercises at  home ***  History of ademonatous colon polyps Dr. Tarri Glenn following, 6 month recall, then 3 year surveillance High fiber diet encouraged   No orders of the defined types were placed in this encounter.    Discussed med's effects and SE's. Screening labs and tests as requested with regular follow-up as recommended. Over 40 minutes of exam, counseling, chart review, and complex, high level critical decision making was performed this visit.   Future Appointments  Date Time Provider [place]  12/18/2020  3:00 PM Liane Comber, NP [facility] None  12/18/2021  3:00 PM Liane Comber, NP [facility] None     HPI  60 y.o. female  presents for a complete physical and follow up for has Hypertension; Depression; Anxiety; Hyperlipidemia; Atrial septal aneurysm; Medication management; Vitamin D deficiency; Overweight (BMI 25.0-29.9); Pelvic prolapse with cystocele; Intermittent palpitations; History of colon polyps; and B12 deficiency on their problem list.   She is married, 1 son, she is doing accounting, back in office. Just back from vacation to [facility].    Denies any concerns today.   Has mainly been seeing Dr. Philis Pique (OB/GYN) and UC, getting estrogen.  She has a mild prolapse with cystocele, was doing therapy, was doing home pelvic exercises but admits hasn't been doing, trying to postpone surgery.   She is on zoloft 50 mg daily for anxiety Has palpitations, has seen Dr. Fidela Juneau, had echo with [facility], atrial septal aneursym, last year switched from verapamil to bisoprolol ***  BMI is There is no height or weight on file to  calculate BMI., she has been working on diet and exercise, she has been cutting down on ice cream/sweets, avoids red meat, chooses chicken or seafood. Watches sugar/carbs closely.  Fluids: decaf tea with 4 tablespoons of sugar for whole pitcher - has tea with stevia at work, limits soda intake, typically diet soda at a movie  Treadmill 4 days a week, 40 min  each  Reduced alcohol from last year - down to a few drinks on the weekends Sleeps well  Wt Readings from Last 3 Encounters:  12/03/20 192 lb (87.1 kg)  11/19/20 192 lb (87.1 kg)  03/05/20 184 lb (83.5 kg)   She admits hasn't been checking BP, today their BP is    Continues to have some palpitation break through occasionally - *** She does workout. She denies chest pain, shortness of breath, dizziness.   She is not on cholesterol medication (is on fiber supplement). Her cholesterol is not at goal. She has been working on diet for cholesterol.  The cholesterol last visit was:   Lab Results  Component Value Date   CHOL 198 12/19/2019   HDL 65 12/19/2019   LDLCALC 108 (H) 12/19/2019   TRIG 135 12/19/2019   CHOLHDL 3.0 12/19/2019   She has been working on diet/exericse. No hx of prediabetes. Last A1C in the office was:   Lab Results  Component Value Date   HGBA1C 5.2 12/19/2019   Last GFR: Lab Results  Component Value Date   GFRNONAA 93 12/19/2019   Patient is on Vitamin D supplement.   Lab Results  Component Value Date   VD25OH 56 12/19/2019     B12 borderline low in 2019, had been on a daily supplement but reports stopped/has been out:  Lab Results  Component Value Date   XAJOINOM76 720 12/19/2019    Current Medications:  Current Outpatient Medications on File Prior to Visit  Medication Sig Dispense Refill  . aspirin EC 81 MG tablet Take 81 mg by mouth daily. Swallow whole.    . bisoprolol (ZEBETA) 10 MG tablet TAKE 2 TABLETS (20 MG) EVERY NIGHT 60 tablet 2  . Cyanocobalamin (VITAMIN B-12 PO) Take by mouth. (Patient not taking: No sig reported)    . Magnesium 250 MG TABS Take by mouth daily.    . norethindrone-ethinyl estradiol (FEMHRT 1/5) 1-5 MG-MCG TABS tablet Take 1 tablet by mouth daily.    . Omega-3 Fatty Acids (FISH OIL PO) Take by mouth.    Marland Kitchen OVER THE COUNTER MEDICATION 5,000 mcg 2 (two) times a day. DHT blocker    . sertraline (ZOLOFT) 100 MG tablet     .  Vitamin D, Cholecalciferol, 1000 UNITS CAPS Take by mouth.     Current Facility-Administered Medications on File Prior to Visit  Medication Dose Route Frequency Provider Last Rate Last Admin  . 0.9 %  sodium chloride infusion  500 mL Intravenous Once Thornton Park, MD       Allergies:  Allergies  Allergen Reactions  . Latex Itching   Medical History:  She has Hypertension; Depression; Anxiety; Hyperlipidemia; Atrial septal aneurysm; Medication management; Vitamin D deficiency; Overweight (BMI 25.0-29.9); Pelvic prolapse with cystocele; Intermittent palpitations; History of colon polyps; and B12 deficiency on their problem list.   Health Maintenance:    Immunization History  Administered Date(s) Administered  . Influenza-Unspecified 04/20/2015, 04/23/2017, 04/16/2018  . Janssen (J&J) SARS-COV-2 Vaccination 10/08/2019  . Pneumococcal Polysaccharide-23 04/20/2015  . Tdap 04/20/2015  . Zoster Recombinat (Shingrix) 04/23/2017, 08/28/2017   Tetanus: 2016  Pneumovax: 2016 Shingrix: 2019 2/2 Influenza: ** Covid 19: ***  Pap: 05/2018, PAP q3y, has pelvic annually with Dr. Mickie Hillier: per care everywhere 08/31/2020, negative *** per patient, gets annually at GYN DEXA: N/A  Colonoscopy: 12/03/2020, adenomatous polyps, Dr. Tarri Glenn, was recommended 6 month recheck to look at polypectomy site, otherwise 3 year recall *** EGD: n/a  Last Dental Exam: Dr. Trenton Gammon, q 6 months, last 2021, goes q40m Last Eye Exam: wears glasses, [facility], last exam 07/2019, going every other year  Patient Care Team: Unk Pinto, MD as PCP - General (Internal Medicine)  Surgical History:  She has a past surgical history that includes Nasal reconstruction (1986) and Colonoscopy (12/22/2014). Family History:  Herfamily history includes Arthritis in her maternal grandfather and mother; Cancer in her maternal grandmother; Congestive Heart Failure in her maternal grandmother; Heart attack in her  maternal grandmother; Hyperlipidemia in her father; Osteoporosis in her maternal grandmother and paternal grandmother; Vascular Disease in her maternal grandfather. Social History:  She reports that she has never smoked. She has never used smokeless tobacco. She reports current alcohol use of about 14.0 standard drinks of alcohol per week. She reports that she does not use drugs.  *** Review of Systems: Review of Systems  Constitutional: Negative for malaise/fatigue and weight loss.  HENT: Negative for hearing loss and tinnitus.   Eyes: Negative for blurred vision and double vision.  Respiratory: Negative for cough, sputum production, shortness of breath and wheezing.   Cardiovascular: Negative for chest pain, palpitations, orthopnea, claudication, leg swelling and PND.       Occasional skipped beat  Gastrointestinal: Negative for abdominal pain, blood in stool, constipation, diarrhea, heartburn, melena, nausea and vomiting.  Genitourinary: Negative.   Musculoskeletal: Negative for falls, joint pain and myalgias.  Skin: Negative for rash.  Neurological: Negative for dizziness, tingling, sensory change, weakness and headaches.  Endo/Heme/Allergies: Negative for polydipsia.  Psychiatric/Behavioral: Negative.  Negative for depression, memory loss, substance abuse and suicidal ideas. The patient is not nervous/anxious and does not have insomnia.   All other systems reviewed and are negative.   Physical Exam: Estimated body mass index is 30.07 kg/m as calculated from the following:   Height as of 12/03/20: 5\' 7"  (1.702 m).   Weight as of 12/03/20: 192 lb (87.1 kg). LMP  (LMP Unknown)  General Appearance: Well nourished, in no apparent distress.  Eyes: PERRLA, EOMs, conjunctiva no swelling or erythema Sinuses: No Frontal/maxillary tenderness  ENT/Mouth: Ext aud canals clear, normal light reflex with TMs without erythema, bulging. Good dentition. No erythema, swelling, or exudate on post  pharynx. Tonsils not swollen or erythematous. Hearing normal.  Neck: Supple, thyroid normal. No bruits  Respiratory: Respiratory effort normal, BS equal bilaterally without rales, rhonchi, wheezing or stridor.  Cardio: RRR without murmurs, rubs or gallops. Brisk peripheral pulses without edema.  Chest: symmetric, with normal excursions and percussion.  Breasts: Defer to GYN Abdomen: Soft, nontender, no guarding, rebound, hernias, masses, or organomegaly.  Lymphatics: Non tender without lymphadenopathy.  Genitourinary: Defer to GYN Musculoskeletal: Full ROM all peripheral extremities,5/5 strength, and normal gait.  Skin: Warm, dry without rashes, lesions, ecchymosis. Neuro: Cranial nerves intact, reflexes equal bilaterally. Normal muscle tone, no cerebellar symptoms. Sensation intact.  Psych: Awake and oriented X 3, normal affect, Insight and Judgment appropriate.   EKG: WNL no ST changes. ***  Gorden Harms Marqueze Ramcharan 11:32 AM [facility]

## 2020-12-18 ENCOUNTER — Encounter: Payer: 59 | Admitting: Adult Health

## 2020-12-18 DIAGNOSIS — Z86010 Personal history of colonic polyps: Secondary | ICD-10-CM

## 2020-12-18 DIAGNOSIS — E669 Obesity, unspecified: Secondary | ICD-10-CM

## 2020-12-18 DIAGNOSIS — I10 Essential (primary) hypertension: Secondary | ICD-10-CM

## 2020-12-18 DIAGNOSIS — R002 Palpitations: Secondary | ICD-10-CM

## 2020-12-18 DIAGNOSIS — E785 Hyperlipidemia, unspecified: Secondary | ICD-10-CM

## 2020-12-18 DIAGNOSIS — Z1389 Encounter for screening for other disorder: Secondary | ICD-10-CM

## 2020-12-18 DIAGNOSIS — Z1329 Encounter for screening for other suspected endocrine disorder: Secondary | ICD-10-CM

## 2020-12-18 DIAGNOSIS — I253 Aneurysm of heart: Secondary | ICD-10-CM

## 2020-12-18 DIAGNOSIS — Z136 Encounter for screening for cardiovascular disorders: Secondary | ICD-10-CM

## 2020-12-18 DIAGNOSIS — E559 Vitamin D deficiency, unspecified: Secondary | ICD-10-CM

## 2020-12-18 DIAGNOSIS — F3342 Major depressive disorder, recurrent, in full remission: Secondary | ICD-10-CM

## 2020-12-18 DIAGNOSIS — N8189 Other female genital prolapse: Secondary | ICD-10-CM

## 2020-12-18 DIAGNOSIS — F419 Anxiety disorder, unspecified: Secondary | ICD-10-CM

## 2020-12-18 DIAGNOSIS — E538 Deficiency of other specified B group vitamins: Secondary | ICD-10-CM

## 2020-12-18 DIAGNOSIS — Z79899 Other long term (current) drug therapy: Secondary | ICD-10-CM

## 2020-12-18 DIAGNOSIS — Z0000 Encounter for general adult medical examination without abnormal findings: Secondary | ICD-10-CM

## 2021-01-02 ENCOUNTER — Other Ambulatory Visit: Payer: Self-pay | Admitting: Adult Health Nurse Practitioner

## 2021-01-02 DIAGNOSIS — I10 Essential (primary) hypertension: Secondary | ICD-10-CM

## 2021-02-28 ENCOUNTER — Other Ambulatory Visit: Payer: Self-pay | Admitting: Nurse Practitioner

## 2021-02-28 DIAGNOSIS — I10 Essential (primary) hypertension: Secondary | ICD-10-CM

## 2021-02-28 MED ORDER — BISOPROLOL FUMARATE 10 MG PO TABS: ORAL_TABLET | ORAL | 1 refills | Status: DC

## 2021-03-22 NOTE — Progress Notes (Signed)
Complete Physical  Assessment and Plan:  Encounter for Annual Physical Exam with abnormal findings Due annually  Health Maintenance reviewed Healthy lifestyle reviewed and goals set Request mmg and PAP reports from GYN  Essential hypertension Taper to D/C bisoprolol; start verapamil once pulse 60+, add losartan 25-50 mg daily for BP goal; 4 week follow up Monitor blood pressure at home; call if consistently over 130/80 Continue DASH diet.   Reminder to go to the ER if any CP, SOB, nausea, dizziness, severe HA, changes vision/speech, left arm numbness and tingling and jaw pain. -     EKG 12-Lead -     Magnesium  Intermittent palpitations/Sympomatic PVCs Triggered by stress/lack of sleep - encouraged lifestyle improvement Continue to reduce caffeine, alcohol  PVCs per monitor in 2016 Will try switching back to verapamil due to breakthrough 120 mg tabs sent in; titrate up as needed.  Check EKG  Atrial septal aneurysm Has been worked up by Dr. Wynonia Lawman; monitor; follow up cardiology as needed  Vitamin D deficiency Continue supplementation Check vitamin D level -     VITAMIN D 25 Hydroxy (Vit-D Deficiency, Fractures)  Medication management -     CBC with Differential/Platelet -     COMPLETE METABOLIC PANEL WITH GFR -     Urinalysis w microscopic + reflex cultur -     Magnesium  Hyperlipidemia, unspecified hyperlipidemia type Continue fiber supplement Continue low cholesterol diet and exercise.  Check lipid panel today -     Lipid panel -     TSH  Recurrent major depressive disorder, in full remission (HCC)/ Anxiety Controlled; Continue zoloft Lifestyle discussed: diet/exerise, sleep hygiene, stress management, hydration  Screening for diabetes mellitus -    A1C  Screening for hematuria or proteinuria -     Urinalysis w microscopic   Overweight (BMI 25.0-29.9) Long discussion about weight loss, diet, and exercise Recommended diet heavy in fruits and veggies and low  in animal meats, cheeses, and dairy products, appropriate calorie intake Discussed appropriate weight for height Follow up at next visit  Pelvic prolapse with cystocele Per Dr. Ulanda Edison; was doing PT; doing exercises at home Planning surgery later this year  History of colon polyps Adenomatous, had 6 month recall per Dr. Tarri Glenn due 05/2021 Then planning 3 year recall  Discussd low processed meat, red meat, reviewed high fiber diet  Female pattern baldness Discussed restarting OTC minoxidil topical product  Nevi/lesions Encouraged total body skin check by derm  B12 def - not on supplement, restart and check next OV  Orders Placed This Encounter  Procedures   CBC with Differential/Platelet   COMPLETE METABOLIC PANEL WITH GFR   Magnesium   Lipid panel   TSH   Hemoglobin A1c   VITAMIN D 25 Hydroxy (Vit-D Deficiency, Fractures)   Vitamin B12   Microalbumin / creatinine urine ratio   Urinalysis, Routine w reflex microscopic   EKG 12-Lead     Discussed med's effects and SE's. Screening labs and tests as requested with regular follow-up as recommended. Over 40 minutes of exam, counseling, chart review, and complex, high level critical decision making was performed this visit.   Future Appointments  Date Time Provider [place]  03/26/2022  3:00 PM Liane Comber, NP [facility] None     HPI  60 y.o. female  presents for a complete physical and follow up for has Hypertension; Depression; Anxiety; Hyperlipidemia; Atrial septal aneurysm; Medication management; Vitamin D deficiency; Obesity (BMI 30.0-34.9); Pelvic prolapse with cystocele; Intermittent palpitations; History of  adenomatous polyp of colon; and B12 deficiency on their problem list.   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  Mother passed from covid 91 last year.   Has mainly been seeing Dr. Philis Pique (OB/GYN) and UC, getting estrogen. She has  a mild prolapse with cystocele, was doing therapy, was doing home pelvic exercises, discussing possible surgery later this year.   She had colonoscopy in 11/2020 with several polyps, was recommended 6 month follow up planned in 6 months, then planning routine follow up in 3 years.   BMI is Body mass index is 29.6 kg/m., she has been working on diet and exercise, she has been cutting down on ice cream/sweets, avoids red meat, chooses chicken or seafood. Watches sugar/carbs closely.  Fluids: decaf tea with 4 tablespoons of sugar for whole pitcher - has tea with stevia at work, limits soda intake, typically diet soda at a movie  Treadmill 4 days a week, 40 min each  Reduced alcohol from last year - down from 14 drinks/week to ~7, working to continue to reduce.  Sleeps well  Wt Readings from Last 3 Encounters:  03/25/21 189 lb (85.7 kg)  12/03/20 192 lb (87.1 kg)  11/19/20 192 lb (87.1 kg)   She is on zoloft 50 mg daily for anxiety and on bisoprolol for BP and palpitations, has seen Dr. Fidela Juneau, had echo with LVH, atrial septal aneursym. Holter in 2016 showed PVCs without tachyarrhythmia.   She reports BP is well controlled, she has been taking bisoprolol 20 mg for BP and palpitations, today their BP is BP: 124/78. She rep rots having more fatigue, pulse has been low but having more sensation of palpitations/fluttering. Worse end of the week when short on sleep.   She does workout. She denies chest pain, shortness of breath, dizziness. Endorses some fatigue.   She is not on cholesterol medication (is on fiber supplement). Her cholesterol is not at goal. She has been working on diet for cholesterol. The cholesterol last visit was:   Lab Results  Component Value Date   CHOL 198 12/19/2019   HDL 65 12/19/2019   LDLCALC 108 (H) 12/19/2019   TRIG 135 12/19/2019   CHOLHDL 3.0 12/19/2019    Last A1C in the office was:   Lab Results  Component Value Date   HGBA1C 5.2 12/19/2019   Last GFR: Lab  Results  Component Value Date   GFRNONAA 93 12/19/2019   Patient is on Vitamin D supplement, taking 1000 IU  Lab Results  Component Value Date   VD25OH 56 12/19/2019     B12 borderline low in 2019, had been on a daily supplement but reports stopped/has been out:  Lab Results  Component Value Date   VITAMINB12 358 12/19/2019    Current Medications:  Current Outpatient Medications on File Prior to Visit  Medication Sig Dispense Refill   aspirin EC 81 MG tablet Take 81 mg by mouth daily. Swallow whole.     Magnesium 250 MG TABS Take by mouth daily.     norethindrone-ethinyl estradiol (FEMHRT 1/5) 1-5 MG-MCG TABS tablet Take 1 tablet by mouth daily.     Omega-3 Fatty Acids (FISH OIL PO) Take by mouth.     OVER THE COUNTER MEDICATION 5,000 mcg 2 (two) times a day. DHT blocker     sertraline (ZOLOFT) 100 MG tablet      Vitamin D, Cholecalciferol, 1000 UNITS CAPS Take by mouth.  Cyanocobalamin (VITAMIN B-12 PO) Take by mouth. (Patient not taking: No sig reported)     Current Facility-Administered Medications on File Prior to Visit  Medication Dose Route Frequency Provider Last Rate Last Admin   0.9 %  sodium chloride infusion  500 mL Intravenous Once [place], MD       Allergies:  Allergies  Allergen Reactions   Latex Itching   Medical History:  She has Hypertension; Depression; Anxiety; Hyperlipidemia; Atrial septal aneurysm; Medication management; Vitamin D deficiency; Obesity (BMI 30.0-34.9); Pelvic prolapse with cystocele; Intermittent palpitations; History of adenomatous polyp of colon; and B12 deficiency on their problem list.   Health Maintenance:    Immunization History  Administered Date(s) Administered   Influenza-Unspecified 04/20/2015, 04/23/2017, 04/16/2018   Janssen (J&J) SARS-COV-2 Vaccination 10/08/2019   Pneumococcal Polysaccharide-23 04/20/2015   Tdap 04/20/2015   Zoster Recombinat (Shingrix) 04/23/2017, 08/28/2017    Tetanus: 2016  Influenza:  get in Oct, gets annually by work Pneumovax: 2016 Shingrix: 2019 2/2 Covid 19: J&J, 09/2019, had moderna booster 05/2018  No LMP recorded (lmp unknown). Patient is postmenopausal.  Pap: 05/2018, PAP q3y, has pelvic annually with Dr. Philis Pique, will get PAP next year MGM: last early 2022 at GYN was normal per patient, gets annually at GYN, will request reports DEXA: N/A  Colonoscopy: 12/03/2020, Dr. Tarri Glenn, polyps, 6 month recall, planning in Nov 2022 EGD: N/A  Last Dental Exam: Dr. Trenton Gammon, last 2022, goes q85mLast Eye Exam: wears glasses, [facility], last exam 07/2019, going every other year  Patient Care Team: MUnk Pinto MD as PCP - General (Internal Medicine)  Surgical History:  She has a past surgical history that includes Nasal reconstruction (1986) and Colonoscopy (12/22/2014). Family History:  Herfamily history includes Arthritis in her maternal grandfather and mother; Cancer in her maternal grandmother; Congestive Heart Failure in her maternal grandmother; Heart attack in her maternal grandmother; Hyperlipidemia in her father; Osteoporosis in her maternal grandmother and paternal grandmother; Vascular Disease in her maternal grandfather. Social History:  She reports that she has never smoked. She has never used smokeless tobacco. She reports current alcohol use of about 7.0 standard drinks per week. She reports that she does not use drugs.  Review of Systems: Review of Systems  Constitutional:  Negative for malaise/fatigue and weight loss.  HENT:  Negative for hearing loss and tinnitus.   Eyes:  Negative for blurred vision and double vision.  Respiratory:  Negative for cough, sputum production, shortness of breath and wheezing.   Cardiovascular:  Positive for palpitations (increased with lack of sleep). Negative for chest pain, orthopnea, claudication, leg swelling and PND.  Gastrointestinal:  Negative for abdominal pain, blood in stool, constipation, diarrhea,  heartburn, melena, nausea and vomiting.  Genitourinary: Negative.   Musculoskeletal:  Negative for falls, joint pain and myalgias.  Skin:  Negative for rash.  Neurological:  Negative for dizziness, tingling, sensory change, weakness and headaches.  Endo/Heme/Allergies:  Negative for polydipsia.  Psychiatric/Behavioral: Negative.  Negative for depression, memory loss, substance abuse and suicidal ideas. The patient is not nervous/anxious and does not have insomnia.   All other systems reviewed and are negative.  Physical Exam: Estimated body mass index is 29.6 kg/m as calculated from the following:   Height as of this encounter: '5\' 7"'$  (1.702 m).   Weight as of this encounter: 189 lb (85.7 kg). BP 124/78   Pulse (!) 51   Temp (!) 97.2 F (36.2 C)   Ht '5\' 7"'$  (1.702 m)   Wt  189 lb (85.7 kg)   LMP  (LMP Unknown)   SpO2 99%   BMI 29.60 kg/m  General Appearance: Well nourished, in no apparent distress.  Eyes: PERRLA, EOMs, conjunctiva no swelling or erythema Sinuses: No Frontal/maxillary tenderness  ENT/Mouth: Ext aud canals clear, normal light reflex with TMs without erythema, bulging. Good dentition. No erythema, swelling, or exudate on post pharynx. Tonsils not swollen or erythematous. Hearing normal.  Neck: Supple, thyroid normal. No bruits  Respiratory: Respiratory effort normal, BS equal bilaterally without rales, rhonchi, wheezing or stridor.  Cardio: Rate bradycardic, regular rhythm, normal S1/S2 without murmurs, rubs or gallops. Brisk peripheral pulses without edema.  Chest: symmetric, with normal excursions and percussion.  Breasts: Defer to GYN Abdomen: Soft, nontender, no guarding, rebound, hernias, masses, or organomegaly.  Lymphatics: Non tender without lymphadenopathy.  Genitourinary: Defer to GYN Musculoskeletal: Full ROM all peripheral extremities,5/5 strength, and normal gait.  Skin: Warm, dry without rashes; she has brown scaly mole to R shin, approx 6 mm, regular  border; scalp with scaly/scabbed erythematous lesion; hair thinning in female baldness pattern; localized erythema to nose and chin without pustular lesions.  Neuro: Cranial nerves intact, reflexes equal bilaterally. Normal muscle tone, no cerebellar symptoms. Sensation intact.  Psych: Awake and oriented X 3, normal affect, Insight and Judgment appropriate.   EKG: Sinus bradycardia  Izora Ribas 3:19 PM [facility]

## 2021-03-25 ENCOUNTER — Other Ambulatory Visit: Payer: Self-pay

## 2021-03-25 ENCOUNTER — Ambulatory Visit (INDEPENDENT_AMBULATORY_CARE_PROVIDER_SITE_OTHER): Payer: 59 | Admitting: Adult Health

## 2021-03-25 ENCOUNTER — Encounter: Payer: Self-pay | Admitting: Adult Health

## 2021-03-25 VITALS — BP 124/78 | HR 51 | Temp 97.2°F | Ht 67.0 in | Wt 189.0 lb

## 2021-03-25 DIAGNOSIS — I10 Essential (primary) hypertension: Secondary | ICD-10-CM | POA: Diagnosis not present

## 2021-03-25 DIAGNOSIS — Z1329 Encounter for screening for other suspected endocrine disorder: Secondary | ICD-10-CM

## 2021-03-25 DIAGNOSIS — E66811 Obesity, class 1: Secondary | ICD-10-CM

## 2021-03-25 DIAGNOSIS — E559 Vitamin D deficiency, unspecified: Secondary | ICD-10-CM

## 2021-03-25 DIAGNOSIS — Z860101 Personal history of adenomatous and serrated colon polyps: Secondary | ICD-10-CM

## 2021-03-25 DIAGNOSIS — Z0000 Encounter for general adult medical examination without abnormal findings: Secondary | ICD-10-CM | POA: Diagnosis not present

## 2021-03-25 DIAGNOSIS — Z131 Encounter for screening for diabetes mellitus: Secondary | ICD-10-CM

## 2021-03-25 DIAGNOSIS — Z0001 Encounter for general adult medical examination with abnormal findings: Secondary | ICD-10-CM

## 2021-03-25 DIAGNOSIS — Z136 Encounter for screening for cardiovascular disorders: Secondary | ICD-10-CM | POA: Diagnosis not present

## 2021-03-25 DIAGNOSIS — R7309 Other abnormal glucose: Secondary | ICD-10-CM

## 2021-03-25 DIAGNOSIS — I253 Aneurysm of heart: Secondary | ICD-10-CM | POA: Diagnosis not present

## 2021-03-25 DIAGNOSIS — I493 Ventricular premature depolarization: Secondary | ICD-10-CM

## 2021-03-25 DIAGNOSIS — L658 Other specified nonscarring hair loss: Secondary | ICD-10-CM

## 2021-03-25 DIAGNOSIS — Z86010 Personal history of colonic polyps: Secondary | ICD-10-CM

## 2021-03-25 DIAGNOSIS — E669 Obesity, unspecified: Secondary | ICD-10-CM

## 2021-03-25 DIAGNOSIS — Z79899 Other long term (current) drug therapy: Secondary | ICD-10-CM

## 2021-03-25 DIAGNOSIS — R002 Palpitations: Secondary | ICD-10-CM

## 2021-03-25 DIAGNOSIS — N8189 Other female genital prolapse: Secondary | ICD-10-CM

## 2021-03-25 DIAGNOSIS — F3342 Major depressive disorder, recurrent, in full remission: Secondary | ICD-10-CM

## 2021-03-25 DIAGNOSIS — Z1389 Encounter for screening for other disorder: Secondary | ICD-10-CM

## 2021-03-25 DIAGNOSIS — F419 Anxiety disorder, unspecified: Secondary | ICD-10-CM

## 2021-03-25 DIAGNOSIS — E785 Hyperlipidemia, unspecified: Secondary | ICD-10-CM

## 2021-03-25 DIAGNOSIS — E538 Deficiency of other specified B group vitamins: Secondary | ICD-10-CM

## 2021-03-25 MED ORDER — LOSARTAN POTASSIUM 50 MG PO TABS: ORAL_TABLET | ORAL | 0 refills | Status: DC

## 2021-03-25 MED ORDER — VERAPAMIL HCL ER 120 MG PO TBCR: EXTENDED_RELEASE_TABLET | ORAL | 1 refills | Status: DC

## 2021-03-25 NOTE — Patient Instructions (Addendum)
[name] , Thank you for taking time to come for your Annual Wellness Visit. I appreciate your ongoing commitment to your health goals. Please review the following plan we discussed and let me know if I can assist you in the future.   These are the goals we discussed:  Goals      Blood Pressure < 130/80     LDL CALC < 130     Reduce alcohol intake     Aim to reduce to <4/week, less is best        This is a list of the screening recommended for you and due dates:  Health Maintenance  Topic Date Due   Mammogram  06/04/2019   Pap Smear  06/03/2020   COVID-19 Vaccine (3 - Booster for Janssen series) 07/28/2020   Flu Shot  04/27/2021*   Pneumococcal Vaccination (2 - PCV) 03/26/2027*   Colon Cancer Screening  12/04/2023   Tetanus Vaccine  04/19/2025   Hepatitis C Screening: USPSTF Recommendation to screen - Ages 18-79 yo.  Completed   HIV Screening  Completed   Zoster (Shingles) Vaccine  Completed   HPV Vaccine  Aged Out  *Topic was postponed. The date shown is not the original due date.      Bisoprolol - 1 tab for 5 days, then 1/2 tab for 5 days - if pulse if 60+, start 1 tab verapamil, then stop bisoprolol completely   If blood pressure creeps up above 130/80, start losartan 1/2 tab for 1 week, then if persistently above goal increase to whole tab     Try azelaic acid 10 % daily at night for redness/possible rosacea - "the ordinary" makes one for $8 or so, can increase to twice daily if tolerating well and wearing sunscreen in AM (SPF in makeup isn't enough)  Suggest scheduling an appointment with derm to have scalp lesions checked, hair loss, total body check  To reduce colon cancer/polyps risk - less red meat, processed meat, more fiber (every 10g of fiber reduces colon cancer risk by 8-10%).   Great sources are: beans, chia seeds, ground flax seed, whole grains, fresh fruits and veggies. I like high fiber wraps ([employer] xtreme wellness is pretty good)  Aim to get 30+ g  of fiber daily - more is better, best from food sources rather than supplements, but they can help as well.    A great goal to work towards is aiming to get in a serving daily of some of the most nutritionally dense foods - G- BOMBS daily          High-Fiber Eating Plan Fiber, also called dietary fiber, is a type of carbohydrate. It is found foods such as fruits, vegetables, whole grains, and beans. A high-fiber diet can have many health benefits. Your health care provider may recommend a high-fiber diet to help: Prevent constipation. Fiber can make your bowel movements more regular. Lower your cholesterol. Relieve the following conditions: Inflammation of veins in the anus (hemorrhoids). Inflammation of specific areas of the digestive tract (uncomplicated diverticulosis). A problem of the large intestine, also called the colon, that sometimes causes pain and diarrhea (irritable bowel syndrome, or IBS). Prevent overeating as part of a weight-loss plan. Prevent heart disease, type 2 diabetes, and certain cancers. What are tips for following this plan? Reading food labels  Check the nutrition facts label on food products for the amount of dietary fiber. Choose foods that have 5 grams of fiber or more per serving. The goals  for recommended daily fiber intake include: Men (age 41 or younger): 34-38 g. Men (over age 53): 28-34 g. Women (age 63 or younger): 25-28 g. Women (over age 8): 22-25 g. Your daily fiber goal is _____________ g. Shopping Choose whole fruits and vegetables instead of processed forms, such as apple juice or applesauce. Choose a wide variety of high-fiber foods such as avocados, lentils, oats, and kidney beans. Read the nutrition facts label of the foods you choose. Be aware of foods with added fiber. These foods often have high sugar and sodium amounts per serving. Cooking Use whole-grain flour for baking and cooking. Cook with brown rice instead of white  rice. Meal planning Start the day with a breakfast that is high in fiber, such as a cereal that contains 5 g of fiber or more per serving. Eat breads and cereals that are made with whole-grain flour instead of refined flour or white flour. Eat brown rice, bulgur wheat, or millet instead of white rice. Use beans in place of meat in soups, salads, and pasta dishes. Be sure that half of the grains you eat each day are whole grains. General information You can get the recommended daily intake of dietary fiber by: Eating a variety of fruits, vegetables, grains, nuts, and beans. Taking a fiber supplement if you are not able to take in enough fiber in your diet. It is better to get fiber through food than from a supplement. Gradually increase how much fiber you consume. If you increase your intake of dietary fiber too quickly, you may have bloating, cramping, or gas. Drink plenty of water to help you digest fiber. Choose high-fiber snacks, such as berries, raw vegetables, nuts, and popcorn. What foods should I eat? Fruits Berries. Pears. Apples. Oranges. Avocado. Prunes and raisins. Dried figs. Vegetables Sweet potatoes. Spinach. Kale. Artichokes. Cabbage. Broccoli. Cauliflower.Green peas. Carrots. Squash. Grains Whole-grain breads. Multigrain cereal. Oats and oatmeal. Brown rice. Barley.Bulgur wheat. [place]. Quinoa. Bran muffins. Popcorn. Rye wafer crackers. Meats and other proteins Navy beans, kidney beans, and pinto beans. Soybeans. Split peas. Lentils. Nutsand seeds. Dairy Fiber-fortified yogurt. Beverages Fiber-fortified soy milk. Fiber-fortified orange juice. Other foods Fiber bars. The items listed above may not be a complete list of recommended foods and beverages. Contact a dietitian for more information. What foods should I avoid? Fruits Fruit juice. Cooked, strained fruit. Vegetables Fried potatoes. Canned vegetables. Well-cooked vegetables. Grains White bread. Pasta made with  refined flour. White rice. Meats and other proteins Fatty cuts of meat. Fried chicken or fried fish. Dairy Milk. Yogurt. Cream cheese. Sour cream. Fats and oils Butters. Beverages Soft drinks. Other foods Cakes and pastries. The items listed above may not be a complete list of foods and beverages to avoid. Talk with your dietitian about what choices are best for you. Summary Fiber is a type of carbohydrate. It is found in foods such as fruits, vegetables, whole grains, and beans. A high-fiber diet has many benefits. It can help to prevent constipation, lower blood cholesterol, aid weight loss, and reduce your risk of heart disease, diabetes, and certain cancers. Increase your intake of fiber gradually. Increasing fiber too quickly may cause cramping, bloating, and gas. Drink plenty of water while you increase the amount of fiber you consume. The best sources of fiber include whole fruits and vegetables, whole grains, nuts, seeds, and beans. This information is not intended to replace advice given to you by your health care provider. Make sure you discuss any questions you have with your  healthcare provider. Document Revised: 11/17/2019 Document Reviewed: 11/17/2019 Elsevier Patient Education  2022 [employer].

## 2021-03-26 ENCOUNTER — Other Ambulatory Visit: Payer: Self-pay | Admitting: Adult Health

## 2021-03-26 DIAGNOSIS — R8290 Unspecified abnormal findings in urine: Secondary | ICD-10-CM

## 2021-03-26 LAB — URINALYSIS, ROUTINE W REFLEX MICROSCOPIC
Bilirubin Urine: NEGATIVE
Glucose, UA: NEGATIVE
Hgb urine dipstick: NEGATIVE
Hyaline Cast: NONE SEEN /LPF
Ketones, ur: NEGATIVE
Nitrite: NEGATIVE
Protein, ur: NEGATIVE
RBC / HPF: NONE SEEN /HPF (ref 0–2)
Specific Gravity, Urine: 1.023 (ref 1.001–1.035)
pH: 5.5 (ref 5.0–8.0)

## 2021-03-26 LAB — LIPID PANEL
Cholesterol: 194 mg/dL (ref <200)
HDL: 59 mg/dL (ref >=50)
LDL Cholesterol (Calc): 112 mg/dL (calc) — ABNORMAL HIGH
Non-HDL Cholesterol (Calc): 135 mg/dL (calc) — ABNORMAL HIGH (ref <130)
Total CHOL/HDL Ratio: 3.3 (calc) (ref <5.0)
Triglycerides: 120 mg/dL (ref <150)

## 2021-03-26 LAB — TSH: TSH: 2.25 mIU/L (ref 0.40–4.50)

## 2021-03-26 LAB — COMPLETE METABOLIC PANEL WITH GFR
AG Ratio: 1.8 (calc) (ref 1.0–2.5)
ALT: 20 U/L (ref 6–29)
AST: 22 U/L (ref 10–35)
Albumin: 4.5 g/dL (ref 3.6–5.1)
Alkaline phosphatase (APISO): 77 U/L (ref 37–153)
BUN: 13 mg/dL (ref 7–25)
CO2: 26 mmol/L (ref 20–32)
Calcium: 9.8 mg/dL (ref 8.6–10.4)
Chloride: 105 mmol/L (ref 98–110)
Creat: 0.78 mg/dL (ref 0.50–1.03)
Globulin: 2.5 g/dL (calc) (ref 1.9–3.7)
Glucose, Bld: 89 mg/dL (ref 65–99)
Potassium: 4.8 mmol/L (ref 3.5–5.3)
Sodium: 140 mmol/L (ref 135–146)
Total Bilirubin: 0.6 mg/dL (ref 0.2–1.2)
Total Protein: 7 g/dL (ref 6.1–8.1)
eGFR: 88 mL/min/{1.73_m2} (ref >=60)

## 2021-03-26 LAB — CBC WITH DIFFERENTIAL/PLATELET
Absolute Monocytes: 668 cells/uL (ref 200–950)
Basophils Absolute: 38 cells/uL (ref 0–200)
Basophils Relative: 0.5 %
Eosinophils Absolute: 90 cells/uL (ref 15–500)
Eosinophils Relative: 1.2 %
HCT: 42.8 % (ref 35.0–45.0)
Hemoglobin: 13.9 g/dL (ref 11.7–15.5)
Lymphs Abs: 2235 cells/uL (ref 850–3900)
MCH: 30.5 pg (ref 27.0–33.0)
MCHC: 32.5 g/dL (ref 32.0–36.0)
MCV: 93.9 fL (ref 80.0–100.0)
MPV: 9.9 fL (ref 7.5–12.5)
Monocytes Relative: 8.9 %
Neutro Abs: 4470 cells/uL (ref 1500–7800)
Neutrophils Relative %: 59.6 %
Platelets: 258 10*3/uL (ref 140–400)
RBC: 4.56 10*6/uL (ref 3.80–5.10)
RDW: 12.4 % (ref 11.0–15.0)
Total Lymphocyte: 29.8 %
WBC: 7.5 10*3/uL (ref 3.8–10.8)

## 2021-03-26 LAB — VITAMIN D 25 HYDROXY (VIT D DEFICIENCY, FRACTURES): Vit D, 25-Hydroxy: 65 ng/mL (ref 30–100)

## 2021-03-26 LAB — MICROALBUMIN / CREATININE URINE RATIO
Creatinine, Urine: 194 mg/dL (ref 20–275)
Microalb Creat Ratio: 5 mcg/mg creat (ref <30)
Microalb, Ur: 0.9 mg/dL

## 2021-03-26 LAB — HEMOGLOBIN A1C
Hgb A1c MFr Bld: 5.3 % of total Hgb (ref <5.7)
Mean Plasma Glucose: 105 mg/dL
eAG (mmol/L): 5.8 mmol/L

## 2021-03-26 LAB — MICROSCOPIC MESSAGE

## 2021-03-26 LAB — MAGNESIUM: Magnesium: 2 mg/dL (ref 1.5–2.5)

## 2021-03-27 LAB — URINE CULTURE
MICRO NUMBER:: 12310051
SPECIMEN QUALITY:: ADEQUATE

## 2021-04-02 ENCOUNTER — Encounter: Payer: Self-pay | Admitting: Gastroenterology

## 2021-04-25 ENCOUNTER — Encounter: Payer: Self-pay | Admitting: Adult Health

## 2021-04-25 ENCOUNTER — Other Ambulatory Visit: Payer: Self-pay

## 2021-04-25 ENCOUNTER — Ambulatory Visit (INDEPENDENT_AMBULATORY_CARE_PROVIDER_SITE_OTHER): Payer: 59 | Admitting: Adult Health

## 2021-04-25 VITALS — BP 136/76 | HR 75 | Temp 97.3°F | Wt 191.0 lb

## 2021-04-25 DIAGNOSIS — R002 Palpitations: Secondary | ICD-10-CM

## 2021-04-25 DIAGNOSIS — I10 Essential (primary) hypertension: Secondary | ICD-10-CM

## 2021-04-25 MED ORDER — VERAPAMIL HCL ER 120 MG PO TBCR: EXTENDED_RELEASE_TABLET | ORAL | 1 refills | Status: DC

## 2021-04-25 MED ORDER — BISOPROLOL FUMARATE 10 MG PO TABS: 10.0000 mg | ORAL_TABLET | Freq: Every day | ORAL | 1 refills | Status: DC

## 2021-04-25 NOTE — Patient Instructions (Addendum)
Goals      Blood Pressure < 130/80     LDL CALC < 130     Reduce alcohol intake     Aim to reduce to <4/week, less is best         HYPERTENSION INFORMATION  Monitor your blood pressure at home, please keep a record and bring that in with you to your next office visit.   Go to the ER if any CP, SOB, nausea, dizziness, severe HA, changes vision/speech  Testing/Procedures: HOW TO TAKE YOUR BLOOD PRESSURE: Rest 5 minutes before taking your blood pressure. Don't smoke or drink caffeinated beverages for at least 30 minutes before. Take your blood pressure before (not after) you eat. Sit comfortably with your back supported and both feet on the floor (don't cross your legs). Elevate your arm to heart level on a table or a desk. Use the proper sized cuff. It should fit smoothly and snugly around your bare upper arm. There should be enough room to slip a fingertip under the cuff. The bottom edge of the cuff should be 1 inch above the crease of the elbow.   Your most recent BP: BP: 136/76   Take your medications faithfully as instructed. Maintain a healthy weight. Get at least 150 minutes of aerobic exercise per week. Minimize salt intake. Minimize alcohol intake  DASH Eating Plan DASH stands for "Dietary Approaches to Stop Hypertension." The DASH eating plan is a healthy eating plan that has been shown to reduce high blood pressure (hypertension). Additional health benefits may include reducing the risk of type 2 diabetes mellitus, heart disease, and stroke. The DASH eating plan may also help with weight loss. WHAT DO I NEED TO KNOW ABOUT THE DASH EATING PLAN? For the DASH eating plan, you will follow these general guidelines: Choose foods with a percent daily value for sodium of less than 5% (as listed on the food label). Use salt-free seasonings or herbs instead of table salt or sea salt. Check with your health care provider or pharmacist before using salt substitutes. Eat  lower-sodium products, often labeled as "lower sodium" or "no salt added." Eat fresh foods. Eat more vegetables, fruits, and low-fat dairy products. Choose whole grains. Look for the word "whole" as the first word in the ingredient list. Choose fish and skinless chicken or [place] more often than red meat. Limit fish, poultry, and meat to 6 oz (170 g) each day. Limit sweets, desserts, sugars, and sugary drinks. Choose heart-healthy fats. Limit cheese to 1 oz (28 g) per day. Eat more home-cooked food and less restaurant, buffet, and fast food. Limit fried foods. Cook foods using methods other than frying. Limit canned vegetables. If you do use them, rinse them well to decrease the sodium. When eating at a restaurant, ask that your food be prepared with less salt, or no salt if possible. WHAT FOODS CAN I EAT? Seek help from a dietitian for individual calorie needs. Grains Whole grain or whole wheat bread. Brown rice. Whole grain or whole wheat pasta. Quinoa, bulgur, and whole grain cereals. Low-sodium cereals. Corn or whole wheat flour tortillas. Whole grain cornbread. Whole grain crackers. Low-sodium crackers. Vegetables Fresh or frozen vegetables (raw, steamed, roasted, or grilled). Low-sodium or reduced-sodium tomato and vegetable juices. Low-sodium or reduced-sodium tomato sauce and paste. Low-sodium or reduced-sodium canned vegetables.  Fruits All fresh, canned (in natural juice), or frozen fruits. Meat and Other Protein Products Ground beef (85% or leaner), grass-fed beef, or beef trimmed of fat. Skinless  chicken or [place]. Ground chicken or [place]. Pork trimmed of fat. All fish and seafood. Eggs. Dried beans, peas, or lentils. Unsalted nuts and seeds. Unsalted canned beans. Dairy Low-fat dairy products, such as skim or 1% milk, 2% or reduced-fat cheeses, low-fat ricotta or cottage cheese, or plain low-fat yogurt. Low-sodium or reduced-sodium cheeses. Fats and Oils Tub margarines without  trans fats. Light or reduced-fat mayonnaise and salad dressings (reduced sodium). Avocado. Safflower, olive, or canola oils. Natural peanut or almond butter. Other Unsalted popcorn and pretzels. The items listed above may not be a complete list of recommended foods or beverages. Contact your dietitian for more options. WHAT FOODS ARE NOT RECOMMENDED? Grains White bread. White pasta. White rice. Refined cornbread. Bagels and croissants. Crackers that contain trans fat. Vegetables Creamed or fried vegetables. Vegetables in a cheese sauce. Regular canned vegetables. Regular canned tomato sauce and paste. Regular tomato and vegetable juices. Fruits Dried fruits. Canned fruit in light or heavy syrup. Fruit juice. Meat and Other Protein Products Fatty cuts of meat. Ribs, chicken wings, bacon, sausage, bologna, salami, chitterlings, fatback, hot dogs, bratwurst, and packaged luncheon meats. Salted nuts and seeds. Canned beans with salt. Dairy Whole or 2% milk, cream, half-and-half, and cream cheese. Whole-fat or sweetened yogurt. Full-fat cheeses or blue cheese. Nondairy creamers and whipped toppings. Processed cheese, cheese spreads, or cheese curds. Condiments Onion and garlic salt, seasoned salt, table salt, and sea salt. Canned and packaged gravies. Worcestershire sauce. Tartar sauce. Barbecue sauce. Teriyaki sauce. Soy sauce, including reduced sodium. Steak sauce. Fish sauce. Oyster sauce. Cocktail sauce. Horseradish. Ketchup and mustard. Meat flavorings and tenderizers. Bouillon cubes. Hot sauce. Tabasco sauce. Marinades. Taco seasonings. Relishes. Fats and Oils Butter, stick margarine, lard, shortening, ghee, and bacon fat. Coconut, palm kernel, or palm oils. Regular salad dressings. Other Pickles and olives. Salted popcorn and pretzels. The items listed above may not be a complete list of foods and beverages to avoid. Contact your dietitian for more information. WHERE CAN I FIND MORE  INFORMATION? National Heart, Lung, and Blood Institute: travelstabloid.com Document Released: 07/03/2011 Document Revised: 11/28/2013 Document Reviewed: 05/18/2013 [facility] Patient Information 2015 [place], [place]. This information is not intended to replace advice given to you by your health care provider. Make sure you discuss any questions you have with your health care provider.

## 2021-04-25 NOTE — Progress Notes (Signed)
Assessment and Plan:  Eusebia was seen today for follow-up.  Diagnoses and all orders for this visit:  Intermittent palpitations Essential hypertension She prefers to get off of losartan; will try bisoprolol 10 mg (has at home) with current dose verapamil; monitor palpitation and BP Goal <130/70 Reduce stress, alcohol, sleep Follow up 2-4 weeks or sooner if needed Encouraged DASH diet, handout given  -     verapamil (CALAN-SR) 120 MG CR tablet; TAKE 1 TABLET BY MOUTH EVERYDAY AT BEDTIME FOR PALPITATIONS. -     bisoprolol (ZEBETA) 10 MG tablet; Take 1 tablet (10 mg total) by mouth daily.  Further disposition pending results of labs. Discussed med's effects and SE's.   Over 15 minutes of exam, counseling, chart review, and critical decision making was performed.   Future Appointments  Date Time Provider [place]  05/24/2021  2:30 PM [facility] PREVISIT RM 50 [facility]  06/07/2021  8:00 AM Thornton Park, MD [facility]  10/29/2021  4:00 PM Liane Comber, NP [facility] None  03/26/2022  3:00 PM Liane Comber, NP [facility] None    ------------------------------------------------------------------------------------------------------------------   HPI BP 136/76   Pulse 75   Temp (!) 97.3 F (36.3 C)   Wt 191 lb (86.6 kg)   LMP  (LMP Unknown)   SpO2 99%   BMI 29.91 kg/m  60 y.o.female presents for blood pressure and palpitations follow up.   She has hx of htn and symptomatic PVCs, was taking bisoprolol 20 mg but was sill having sense of palpitations, but also fatigue with pulse low 50s. Tapered off and started verapamil 120 mg and losartan with taper.  She reports is having palpitations with stress with current verapamil, had HA/didn't feel well with losartan 50 mg and reduced back to 25 mg. Would prefer to stop this med if possible.   Her blood pressure has not been controlled at home (140s/70s), today their BP is BP: 136/76  She does not workout.  She denies chest pain, shortness of breath, dizziness. She admits hasn't reduced alcohol intake, work related stress remains high, thinking about new job.    Past Medical History:  Diagnosis Date   Absolute anemia 04/24/2015   Anxiety    Degenerative disc disease, cervical    Depression    Hypertension    Migraine    history, has not had in years   PVC (premature ventricular contraction)      Allergies  Allergen Reactions   Latex Itching    Current Outpatient Medications on File Prior to Visit  Medication Sig   aspirin EC 81 MG tablet Take 81 mg by mouth daily. Swallow whole.   Cyanocobalamin (VITAMIN B-12 PO) Take by mouth.   norethindrone-ethinyl estradiol (FEMHRT 1/5) 1-5 MG-MCG TABS tablet Take 1 tablet by mouth daily.   Omega-3 Fatty Acids (FISH OIL PO) Take by mouth.   OVER THE COUNTER MEDICATION 5,000 mcg 2 (two) times a day. DHT blocker   sertraline (ZOLOFT) 100 MG tablet    Vitamin D, Cholecalciferol, 1000 UNITS CAPS Take by mouth.   Current Facility-Administered Medications on File Prior to Visit  Medication   0.9 %  sodium chloride infusion    ROS: all negative except above.   Physical Exam:  BP 136/76   Pulse 75   Temp (!) 97.3 F (36.3 C)   Wt 191 lb (86.6 kg)   LMP  (LMP Unknown)   SpO2 99%   BMI 29.91 kg/m   General Appearance: Well nourished, in no apparent  distress. Eyes: PERRLA, conjunctiva no swelling or erythema ENT/Mouth: mask in place; Hearing normal.  Neck: Supple, thyroid normal.  Respiratory: Respiratory effort normal, BS equal bilaterally without rales, rhonchi, wheezing or stridor.  Cardio: RRR with no MRGs. Brisk peripheral pulses without edema.  Abdomen: Soft, + BS.  Non tender, no guarding, rebound, hernias, masses. Lymphatics: Non tender without lymphadenopathy.  Musculoskeletal: no obvious deformity; normal gait.  Skin: Warm, dry without rashes, lesions, ecchymosis. Thinning in female pattern.  Neuro: Normal muscle tone Psych:  Awake and oriented X 3, normal affect, Insight and Judgment appropriate.     Izora Ribas, NP 4:48 PM [facility]

## 2021-04-26 ENCOUNTER — Other Ambulatory Visit: Payer: Self-pay | Admitting: Adult Health

## 2021-05-24 ENCOUNTER — Ambulatory Visit (AMBULATORY_SURGERY_CENTER): Payer: 59 | Admitting: *Deleted

## 2021-05-24 ENCOUNTER — Other Ambulatory Visit: Payer: Self-pay

## 2021-05-24 ENCOUNTER — Encounter: Payer: Self-pay | Admitting: Gastroenterology

## 2021-05-24 VITALS — Ht 67.0 in | Wt 191.0 lb

## 2021-05-24 DIAGNOSIS — Z86010 Personal history of colonic polyps: Secondary | ICD-10-CM

## 2021-05-24 MED ORDER — SUTAB 1479-225-188 MG PO TABS: 1.0000 | ORAL_TABLET | ORAL | 0 refills | Status: DC

## 2021-05-24 NOTE — Progress Notes (Signed)
Patient's pre-visit was done today over the phone with the patient due to COVID-19 pandemic. Name,DOB and address verified. Patient denies any allergies to Eggs and Soy. Patient denies any problems with anesthesia/sedation. Patient is not taking any diet pills or blood thinners. No home Oxygen. Packet of Prep instructions mailed to patient including a copy of a consent form-pt is aware. Patient understands to call us back with any questions or concerns. Patient is aware of our care-partner policy and [id number] safety protocol. Patient denies any medical chart hx changes since last GI visit.  EMMI education assigned to the patient for the procedure, sent to [facility].   The patient is COVID-19 vaccinated.

## 2021-06-07 ENCOUNTER — Ambulatory Visit (AMBULATORY_SURGERY_CENTER): Payer: 59 | Admitting: Gastroenterology

## 2021-06-07 ENCOUNTER — Other Ambulatory Visit: Payer: Self-pay

## 2021-06-07 ENCOUNTER — Encounter: Payer: Self-pay | Admitting: Gastroenterology

## 2021-06-07 VITALS — BP 109/45 | HR 50 | Temp 97.1°F | Resp 12 | Ht 67.0 in | Wt 191.0 lb

## 2021-06-07 DIAGNOSIS — Z86010 Personal history of colonic polyps: Secondary | ICD-10-CM | POA: Diagnosis present

## 2021-06-07 DIAGNOSIS — D122 Benign neoplasm of ascending colon: Secondary | ICD-10-CM

## 2021-06-07 DIAGNOSIS — D120 Benign neoplasm of cecum: Secondary | ICD-10-CM

## 2021-06-07 HISTORY — PX: COLONOSCOPY WITH PROPOFOL: SHX5780

## 2021-06-07 MED ORDER — SODIUM CHLORIDE 0.9 % IV SOLN: 500.0000 mL | Freq: Once | INTRAVENOUS | Status: DC

## 2021-06-07 NOTE — Patient Instructions (Addendum)
Handout provided on polyps and hemorrhoids.   YOU HAD AN ENDOSCOPIC PROCEDURE TODAY AT [facility]:   Refer to the procedure report that was given to you for any specific questions about what was found during the examination.  If the procedure report does not answer your questions, please call your gastroenterologist to clarify.  If you requested that your care partner not be given the details of your procedure findings, then the procedure report has been included in a sealed envelope for you to review at your convenience later.  YOU SHOULD EXPECT: Some feelings of bloating in the abdomen. Passage of more gas than usual.  Walking can help get rid of the air that was put into your GI tract during the procedure and reduce the bloating. If you had a lower endoscopy (such as a colonoscopy or flexible sigmoidoscopy) you may notice spotting of blood in your stool or on the toilet paper. If you underwent a bowel prep for your procedure, you may not have a normal bowel movement for a few days.  Please Note:  You might notice some irritation and congestion in your nose or some drainage.  This is from the oxygen used during your procedure.  There is no need for concern and it should clear up in a day or so.  SYMPTOMS TO REPORT IMMEDIATELY:  Following lower endoscopy (colonoscopy or flexible sigmoidoscopy):  Excessive amounts of blood in the stool  Significant tenderness or worsening of abdominal pains  Swelling of the abdomen that is new, acute  Fever of 100F or higher  For urgent or emergent issues, a gastroenterologist can be reached at any hour by calling [phone]. Do not use MyChart messaging for urgent concerns.    DIET:  We do recommend a small meal at first, but then you may proceed to your regular diet.  Drink plenty of fluids but you should avoid alcoholic beverages for 24 hours.  ACTIVITY:  You should plan to take it easy for the rest of today and you should NOT DRIVE or  use heavy machinery until tomorrow (because of the sedation medicines used during the test).    FOLLOW UP: Our staff will call the number listed on your records 48-72 hours following your procedure to check on you and address any questions or concerns that you may have regarding the information given to you following your procedure. If we do not reach you, we will leave a message.  We will attempt to reach you two times.  During this call, we will ask if you have developed any symptoms of COVID 19. If you develop any symptoms (ie: fever, flu-like symptoms, shortness of breath, cough etc.) before then, please call [phone].  If you test positive for Covid 19 in the 2 weeks post procedure, please call and report this information to [place].    If any biopsies were taken you will be contacted by phone or by letter within the next 1-3 weeks.  Please call us at [phone] if you have not heard about the biopsies in 3 weeks.    SIGNATURES/CONFIDENTIALITY: You and/or your care partner have signed paperwork which will be entered into your electronic medical record.  These signatures attest to the fact that that the information above on your After Visit Summary has been reviewed and is understood.  Full responsibility of the confidentiality of this discharge information lies with you and/or your care-partner.

## 2021-06-07 NOTE — Progress Notes (Signed)
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 female presents for surveillancecolonoscopy.  2.0cm carpeted cecal polyp removed on colonoscopy in 2016. Pathology consistent with tubular adenoma. Surveillance recommended in 2-3 years.   Colonoscopy 12/03/20 showed 3 tubular adenomas including an 44mm serpiginous polyp in the cecum removed in a piecemeal fashion. Surveillance recommended in 6 months.   No known family history of colon cancer or polyps. No family history of uterine/endometrial cancer, pancreatic cancer or gastric/stomach cancer.   Past Medical History:  Diagnosis Date   Absolute anemia 04/24/2015   Anxiety    Degenerative disc disease, cervical    Depression    Hypertension    Migraine    history, has not had in years   PVC (premature ventricular contraction)     Past Surgical History:  Procedure Laterality Date   COLONOSCOPY  12/22/2014   COLONOSCOPY WITH PROPOFOL  12/03/2020   Dr.Aragon Scarantino-polyps   NASAL RECONSTRUCTION  1986   Septal repair   POLYPECTOMY      Current Outpatient Medications  Medication Sig Dispense Refill   bisoprolol (ZEBETA) 10 MG tablet Take 1 tablet (10 mg total) by mouth daily. 90 tablet 1   norethindrone-ethinyl estradiol (FEMHRT 1/5) 1-5 MG-MCG TABS tablet Take 1 tablet by mouth daily.     sertraline (ZOLOFT) 100 MG tablet      verapamil (CALAN-SR) 120 MG CR tablet TAKE 1 TABLET BY MOUTH EVERYDAY AT BEDTIME FOR PALPITATIONS. 90 tablet 1   aspirin EC 81 MG tablet Take 81 mg by mouth daily. Swallow whole.     Cyanocobalamin (VITAMIN B-12 PO) Take by mouth.     MAGNESIUM PO Take by mouth.      Omega-3 Fatty Acids (FISH OIL PO) Take by mouth.     OVER THE COUNTER MEDICATION 5,000 mcg 2 (two) times a day. DHT blocker     Vitamin D, Cholecalciferol, 1000 UNITS CAPS Take by mouth.     Current Facility-Administered Medications  Medication Dose Route Frequency Provider Last Rate Last Admin   0.9 %  sodium chloride infusion  500 mL Intravenous Once Thornton Park, MD        Allergies as of 06/07/2021 - Review Complete 06/07/2021  Allergen Reaction Noted   Latex Itching 12/19/2019    Family History  Problem Relation Age of Onset   Arthritis Mother        bilateral knees   Hyperlipidemia Father    Cancer Maternal Grandmother        cervical cancer   Osteoporosis Maternal Grandmother    Heart attack Maternal Grandmother        age 22   Congestive Heart Failure Maternal Grandmother        age 13   Arthritis Maternal Grandfather        Severe, bilateral hands   Vascular Disease Maternal Grandfather        smoker, PAD   Osteoporosis Paternal Grandmother    Colon cancer Neg Hx    Breast cancer Neg Hx    Colon polyps Neg Hx    Esophageal cancer Neg Hx    Rectal cancer Neg Hx    Stomach cancer Neg Hx  Physical Exam: General:   Alert,  well-nourished, pleasant and cooperative in NAD Head:  Normocephalic and atraumatic. Eyes:  Sclera clear, no icterus.   Conjunctiva pink. Mouth:  No deformity or lesions.   Neck:  Supple; no masses or thyromegaly. Lungs:  Clear throughout to auscultation.   No wheezes. Heart:  Regular rate and rhythm; no murmurs. Abdomen:  Soft, non-tender, nondistended, normal bowel sounds, no rebound or guarding.  Msk:  Symmetrical. No boney deformities LAD: No inguinal or umbilical LAD Extremities:  No clubbing or edema. Neurologic:  Alert and  oriented x4;  grossly nonfocal Skin:  No obvious rash or bruise. Psych:  Alert and cooperative. Normal mood and affect.      Nysir Fergusson L. Tarri Glenn, MD, MPH 06/07/2021, 7:59 AM

## 2021-06-07 NOTE — Progress Notes (Signed)
Vss nad pt transferrd to pacu

## 2021-06-07 NOTE — Op Note (Addendum)
[facility] Patient Name: Samantha Terrell Procedure Date: 06/07/2021 7:37 AM MRN: [id number] Endoscopist: Thornton Park MD, MD Age: 60 Referring MD:  Date of Birth: September 24, 1960 Gender: Female Account #: [id number] Procedure:                Colonoscopy Indications:              Surveillance: Personal history of piecemeal removal                            of adenoma on last colonoscopy 6 months ago                           30mm cecal polyp removed by Dr. Olevia Perches in 2016                           25mm serpigenous polyp removed in piecemeal fashion                            5/22, two small tubular adenomas                           Surveillance today to insure complete resection                           No known family history of colon cancer or polyps Medicines:                Monitored Anesthesia Care Procedure:                Pre-Anesthesia Assessment:                           - Prior to the procedure, a History and Physical                            was performed, and patient medications and                            allergies were reviewed. The patient's tolerance of                            previous anesthesia was also reviewed. The risks                            and benefits of the procedure and the sedation                            options and risks were discussed with the patient.                            All questions were answered, and informed consent                            was obtained. Prior Anticoagulants: The patient has  taken no previous anticoagulant or antiplatelet                            agents. ASA Grade Assessment: II - A patient with                            mild systemic disease. After reviewing the risks                            and benefits, the patient was deemed in                            satisfactory condition to undergo the procedure.                           After obtaining informed  consent, the colonoscope                            was passed under direct vision. Throughout the                            procedure, the patient's blood pressure, pulse, and                            oxygen saturations were monitored continuously. The                            #[id number] Olympus [id number] was introduced through                            the anus and advanced to the the cecum, identified                            by appendiceal orifice and ileocecal valve. A                            second forward view of the right colon was                            performed. The colonoscopy was performed without                            difficulty. The patient tolerated the procedure                            well. The quality of the bowel preparation was                            good. The terminal ileum, ileocecal valve,                            appendiceal orifice, and rectum were photographed. Scope In: 8:09:22 AM Scope Out: 8:27:55 AM Scope Withdrawal Time: 0 hours 13 minutes 33 seconds  Total Procedure Duration: 0 hours 18 minutes 33 seconds  Findings:                 The perianal and digital rectal examinations were                            normal.                           A less than 1 mm polyp was found in the ascending                            colon. The polyp was flat. The polyp was removed                            with a cold biopsy forceps. Resection and retrieval                            were complete. Estimated blood loss was minimal.                           There was evidence for prior polypectomy in the                            cecum. One of the scar had approximately 4 mm of                            residual polyp. The residual, sessile polyp was                            removed with a piecemeal technique using a cold                            snare. Resection and retrieval were complete.                            Estimated blood loss  was minimal.                           The exam was otherwise without abnormality on                            direct and retroflexion views except for internal                            hemorrhoids. Complications:            No immediate complications. Estimated Blood Loss:     Estimated blood loss was minimal. Impression:               - One less than 1 mm polyp in the ascending colon,                            removed with a cold biopsy forceps. Resected and  retrieved.                           - 4 mm residual polyp in the cecum, removed                            piecemeal using a cold snare. Resected and                            retrieved.                           - Small internal hemorrhoids.                           - The examination was otherwise normal on direct                            and retroflexion views. Recommendation:           - Patient has a contact number available for                            emergencies. The signs and symptoms of potential                            delayed complications were discussed with the                            patient. Return to normal activities tomorrow.                            Written discharge instructions were provided to the                            patient.                           - Resume previous diet.                           - Continue present medications.                           - Await pathology results.                           - Repeat colonoscopy in 3 years for surveillance,                            earlier with enw symptoms.                           - Emerging evidence supports eating a diet of                            fruits, vegetables, grains, calcium, and yogurt  while reducing red meat and alcohol may reduce the                            risk of colon cancer.                           - Given these results, all first degree relatives                             (brothers, sisters, children, parents) should start                            colon cancer screening at age 92.                           - Thank you for allowing me to be involved in your                            colon cancer prevention. Thornton Park MD, MD 06/07/2021 8:33:23 AM This report has been signed electronically.

## 2021-06-07 NOTE — Progress Notes (Signed)
Called to room to assist during endoscopic procedure.  Patient ID and intended procedure confirmed with present staff. Received instructions for my participation in the procedure from the performing physician.  

## 2021-06-11 ENCOUNTER — Telehealth: Payer: Self-pay

## 2021-06-11 NOTE — Telephone Encounter (Signed)
  Follow up Call-  Call back number 06/07/2021 12/03/2020  Post procedure Call Back phone  # [phone]  Permission to leave phone message Yes Yes  Some recent data might be hidden     Patient questions:  Do you have a fever, pain , or abdominal swelling? No. Pain Score  0 *  Have you tolerated food without any problems? Yes.    Have you been able to return to your normal activities? Yes.    Do you have any questions about your discharge instructions: Diet   No. Medications  No. Follow up visit  No.  Do you have questions or concerns about your Care? No.  Actions: * If pain score is 4 or above: No action needed, pain <4.  Have you developed a fever since your procedure? No   2.   Have you had an respiratory symptoms (SOB or cough) since your procedure? No   3.   Have you tested positive for COVID 19 since your procedure no   4.   Have you had any family members/close contacts diagnosed with the COVID 19 since your procedure?  No    If yes to any of these questions please route to Joylene John, RN and Joella Prince, RN

## 2021-06-16 ENCOUNTER — Encounter: Payer: Self-pay | Admitting: Gastroenterology

## 2021-08-02 ENCOUNTER — Other Ambulatory Visit: Payer: Self-pay | Admitting: Obstetrics and Gynecology

## 2021-08-02 DIAGNOSIS — Z1231 Encounter for screening mammogram for malignant neoplasm of breast: Secondary | ICD-10-CM

## 2021-09-05 ENCOUNTER — Other Ambulatory Visit: Payer: Self-pay | Admitting: Adult Health

## 2021-09-05 DIAGNOSIS — I10 Essential (primary) hypertension: Secondary | ICD-10-CM

## 2021-09-11 ENCOUNTER — Ambulatory Visit: Payer: 59

## 2021-09-14 ENCOUNTER — Other Ambulatory Visit: Payer: Self-pay | Admitting: Obstetrics and Gynecology

## 2021-09-14 DIAGNOSIS — Z01818 Encounter for other preprocedural examination: Secondary | ICD-10-CM

## 2021-10-28 ENCOUNTER — Encounter (HOSPITAL_BASED_OUTPATIENT_CLINIC_OR_DEPARTMENT_OTHER): Payer: Self-pay | Admitting: Obstetrics and Gynecology

## 2021-10-29 ENCOUNTER — Ambulatory Visit: Payer: 59 | Admitting: Adult Health

## 2021-10-30 ENCOUNTER — Other Ambulatory Visit: Payer: Self-pay

## 2021-10-30 ENCOUNTER — Encounter (HOSPITAL_BASED_OUTPATIENT_CLINIC_OR_DEPARTMENT_OTHER): Payer: Self-pay | Admitting: Obstetrics and Gynecology

## 2021-10-30 NOTE — Progress Notes (Signed)
Spoke w/ via phone for pre-op interview--- pt ?Lab needs dos----  no             ?Lab results------ pt getting lab work done 11-04-2021, CBC/BMP/T&S;;  has current ekg in epic/ chart ?COVID test -----patient states asymptomatic no test needed ?Arrive at ------- 1000 on 11-06-2021 ?NPO after MN NO Solid Food.  Clear liquids from MN until--- 0900 ?Med rec completed ?Medications to take morning of surgery ----- none ?Diabetic medication ----- n/a ?Patient instructed no nail polish to be worn day of surgery ?Patient instructed to bring photo id and insurance card day of surgery ?Patient aware to have Driver (ride ) / caregiver for 24 hours after surgery -- husband, michael ?Patient Special Instructions ----- n/a ?Pre-Op special Istructions ----- n/a ?Patient verbalized understanding of instructions that were given at this phone interview. ?Patient denies shortness of breath, chest pain, fever, cough at this phone interview.  ?

## 2021-11-04 ENCOUNTER — Encounter (HOSPITAL_COMMUNITY)
Admission: RE | Admit: 2021-11-04 | Discharge: 2021-11-04 | Disposition: A | Discharge status: Home / Self Care | Payer: 59 | Source: Ambulatory Visit | Attending: Obstetrics and Gynecology | Admitting: Obstetrics and Gynecology

## 2021-11-04 DIAGNOSIS — Z01818 Encounter for other preprocedural examination: Secondary | ICD-10-CM

## 2021-11-04 DIAGNOSIS — Z01812 Encounter for preprocedural laboratory examination: Secondary | ICD-10-CM | POA: Diagnosis present

## 2021-11-04 DIAGNOSIS — I10 Essential (primary) hypertension: Secondary | ICD-10-CM | POA: Diagnosis not present

## 2021-11-04 LAB — CBC
HCT: 44.3 % (ref 36.0–46.0)
Hemoglobin: 14.9 g/dL (ref 12.0–15.0)
MCH: 31.2 pg (ref 26.0–34.0)
MCHC: 33.6 g/dL (ref 30.0–36.0)
MCV: 92.7 fL (ref 80.0–100.0)
Platelets: 255 10*3/uL (ref 150–400)
RBC: 4.78 MIL/uL (ref 3.87–5.11)
RDW: 12.5 % (ref 11.5–15.5)
WBC: 8.1 10*3/uL (ref 4.0–10.5)
nRBC: 0 % (ref 0.0–0.2)

## 2021-11-04 LAB — BASIC METABOLIC PANEL
Anion gap: 7 (ref 5–15)
BUN: 15 mg/dL (ref 6–20)
CO2: 26 mmol/L (ref 22–32)
Calcium: 9.3 mg/dL (ref 8.9–10.3)
Chloride: 105 mmol/L (ref 98–111)
Creatinine, Ser: 0.94 mg/dL (ref 0.44–1.00)
GFR, Estimated: >60 mL/min (ref >60)
Glucose, Bld: 111 mg/dL — ABNORMAL HIGH (ref 70–99)
Potassium: 3.9 mmol/L (ref 3.5–5.1)
Sodium: 138 mmol/L (ref 135–145)

## 2021-11-05 NOTE — H&P (Signed)
61 y.o. G1P1 complains of SUI and cystocele. ? ?Previously:"No bleeding at all. No irritation. Feels like dropped down more. Leaking with cough or sneeze; also having to sit on toilet to empty. ?incontinence (Bladder seems more out this year and more leaking with cough or sneezes. No other activities. Feels like outside of body. Once a day.)" ? ? ?Past Medical History:  ?Diagnosis Date  ? Degenerative disc disease, cervical   ? Depression   ? Family history of adverse reaction to anesthesia   ? mother-- ponv  ? GAD (generalized anxiety disorder)   ? History of adenomatous polyp of colon   ? History of COVID-19 11/2020  ? per pt mild symptoms that resolved  ? Hypertension   ? PVC (premature ventricular contraction)   ? symptomatic intermittant palpitations  (event monitor in epic 12-03-2014 showed SR w/ PVCs)  ? SUI (stress urinary incontinence, female)   ? ?Past Surgical History:  ?Procedure Laterality Date  ? COLONOSCOPY WITH PROPOFOL  06/07/2021  ? by Dr.Beavers  ? NASAL SEPTUM SURGERY  1986  ?  ?Social History  ? ?Socioeconomic History  ? Marital status: Married  ?  Spouse name: Not on file  ? Number of children: Not on file  ? Years of education: Not on file  ? Highest education level: Not on file  ?Occupational History  ? Not on file  ?Tobacco Use  ? Smoking status: Never  ? Smokeless tobacco: Never  ?Vaping Use  ? Vaping Use: Never used  ?Substance and Sexual Activity  ? Alcohol use: Yes  ?  Alcohol/week: 6.0 standard drinks  ?  Types: 6 Glasses of wine per week  ? Drug use: Never  ? Sexual activity: Yes  ?  Partners: Male  ?  Birth control/protection: Post-menopausal  ?Other Topics Concern  ? Not on file  ?Social History Narrative  ? Not on file  ? ?Social Determinants of Health  ? ?Financial Resource Strain: Not on file  ?Food Insecurity: Not on file  ?Transportation Needs: Not on file  ?Physical Activity: Not on file  ?Stress: Not on file  ?Social Connections: Not on file  ?Intimate Partner Violence: Not  on file  ? ? ?No current facility-administered medications on file prior to encounter.  ? ?Current Outpatient Medications on File Prior to Encounter  ?Medication Sig Dispense Refill  ? aspirin EC 81 MG tablet Take 81 mg by mouth at bedtime. Swallow whole.    ? bisoprolol (ZEBETA) 10 MG tablet Take 1 tablet (10 mg total) by mouth daily. (Patient taking differently: Take 10 mg by mouth at bedtime.) 90 tablet 1  ? norethindrone-ethinyl estradiol (FEMHRT 1/5) 1-5 MG-MCG TABS tablet Take 1 tablet by mouth at bedtime.    ? OVER THE COUNTER MEDICATION Take 5,000 mcg by mouth 2 (two) times a day. DHT blocker    ? sertraline (ZOLOFT) 100 MG tablet Take 50 mg by mouth daily with lunch.    ? verapamil (CALAN-SR) 120 MG CR tablet TAKE 1 TABLET BY MOUTH EVERYDAY AT BEDTIME FOR PALPITATIONS. (Patient taking differently: 120 mg at bedtime. TAKE 1 TABLET BY MOUTH EVERYDAY AT BEDTIME FOR PALPITATIONS.) 90 tablet 1  ? Vitamin D, Cholecalciferol, 1000 UNITS CAPS Take by mouth at bedtime.    ? Cyanocobalamin (VITAMIN B-12 PO) Take by mouth. (Patient not taking: Reported on 10/30/2021)    ? MAGNESIUM PO Take by mouth. (Patient not taking: Reported on 10/30/2021)    ? Omega-3 Fatty Acids (FISH OIL PO) Take by  mouth. (Patient not taking: Reported on 10/30/2021)    ? ? ?Allergies  ?Allergen Reactions  ? Latex Itching  ? ? ?Vitals:  ? 10/30/21 1017  ?Weight: 85.7 kg  ?Height: 5' 7.5" (1.715 m)  ? ? ?Lungs: clear to ascultation ?Cor:  RRR ?Abdomen:  soft, nontender, nondistended. ?Ex:  no cords, erythema ?Pelvic:   ?Vulva: no masses, no atrophy, no lesions ?Vagina: no tenderness, no erythema, no abnormal vaginal discharge, no vesicle(s) or ulcers, no rectocele, cystocele (-2) ?Cervix: grossly normal, no discharge, no cervical motion tenderness ?Uterus: normal size (7), normal shape, midline, no uterine prolapse, non-tender ?Bladder/Urethra: no urethral discharge, no urethral mass, bladder non distended, Urethra hypermobile  (>45) ?Adnexa/Parametria: no parametrial tenderness, no parametrial mass, no adnexal tenderness, no ovarian mass ? ? ?A:  Uterus well suspended, no splinting, will do TVT, A-repair and cysto. ? ? ?P: P: All risks, benefits and alternatives d/w patient and she desires to proceed.  Patient has undergone ERAS protocol and will receive preop antibiotics and SCDs during the operation. Pt to go home same day with catheter.  ?  Daria Pastures  ?

## 2021-11-05 NOTE — Anesthesia Preprocedure Evaluation (Addendum)
Anesthesia Evaluation  ?Patient identified by MRN, date of birth, ID band ?Patient awake ? ? ? ?Reviewed: ?Allergy & Precautions, H&P , NPO status , Patient's Chart, lab work & pertinent test results ? ?Airway ?Mallampati: II ? ?TM Distance: >3 FB ?Neck ROM: Full ? ? ? Dental ?no notable dental hx. ?(+) Teeth Intact, Dental Advisory Given ?  ?Pulmonary ?neg pulmonary ROS,  ?  ?Pulmonary exam normal ?breath sounds clear to auscultation ? ? ? ? ? ? Cardiovascular ?Exercise Tolerance: Good ?hypertension, Pt. on medications and Pt. on home beta blockers ? ?Rhythm:Regular Rate:Normal ? ? ?  ?Neuro/Psych ?Anxiety Depression negative neurological ROS ?   ? GI/Hepatic ?negative GI ROS, Neg liver ROS,   ?Endo/Other  ?negative endocrine ROS ? Renal/GU ?negative Renal ROS  ?negative genitourinary ?  ?Musculoskeletal ? ?(+) Arthritis , Osteoarthritis,   ? Abdominal ?  ?Peds ? Hematology ?negative hematology ROS ?(+)   ?Anesthesia Other Findings ? ? Reproductive/Obstetrics ?negative OB ROS ? ?  ? ? ? ? ? ? ? ? ? ? ? ? ? ?  ?  ? ? ? ? ? ? ? ?Anesthesia Physical ?Anesthesia Plan ? ?ASA: 2 ? ?Anesthesia Plan: General  ? ?Post-op Pain Management: Toradol IV (intra-op)* and Tylenol PO (pre-op)*  ? ?Induction: Intravenous ? ?PONV Risk Score and Plan: 4 or greater and Midazolam, Dexamethasone and Ondansetron ? ?Airway Management Planned: LMA ? ?Additional Equipment:  ? ?Intra-op Plan:  ? ?Post-operative Plan: Extubation in OR ? ?Informed Consent: I have reviewed the patients History and Physical, chart, labs and discussed the procedure including the risks, benefits and alternatives for the proposed anesthesia with the patient or authorized representative who has indicated his/her understanding and acceptance.  ? ? ? ?Dental advisory given ? ?Plan Discussed with: CRNA ? ?Anesthesia Plan Comments:   ? ? ? ? ? ?Anesthesia Quick Evaluation ? ?

## 2021-11-05 NOTE — Progress Notes (Signed)
Talked with patient. To come in at 0900 instead of 1000. Clear liquids until 0800 ?

## 2021-11-06 ENCOUNTER — Ambulatory Visit (HOSPITAL_BASED_OUTPATIENT_CLINIC_OR_DEPARTMENT_OTHER): Payer: 59 | Admitting: Anesthesiology

## 2021-11-06 ENCOUNTER — Other Ambulatory Visit: Payer: Self-pay

## 2021-11-06 ENCOUNTER — Encounter (HOSPITAL_BASED_OUTPATIENT_CLINIC_OR_DEPARTMENT_OTHER)
Admission: RE | Disposition: A | Discharge status: Home / Self Care | Payer: Self-pay | Source: Home / Self Care | Attending: Obstetrics and Gynecology

## 2021-11-06 ENCOUNTER — Encounter (HOSPITAL_BASED_OUTPATIENT_CLINIC_OR_DEPARTMENT_OTHER): Payer: Self-pay | Admitting: Obstetrics and Gynecology

## 2021-11-06 ENCOUNTER — Ambulatory Visit (HOSPITAL_BASED_OUTPATIENT_CLINIC_OR_DEPARTMENT_OTHER)
Admission: RE | Admit: 2021-11-06 | Discharge: 2021-11-06 | Disposition: A | Discharge status: Home / Self Care | Payer: 59 | Attending: Obstetrics and Gynecology | Admitting: Obstetrics and Gynecology

## 2021-11-06 DIAGNOSIS — F419 Anxiety disorder, unspecified: Secondary | ICD-10-CM | POA: Diagnosis not present

## 2021-11-06 DIAGNOSIS — I10 Essential (primary) hypertension: Secondary | ICD-10-CM | POA: Insufficient documentation

## 2021-11-06 DIAGNOSIS — F32A Depression, unspecified: Secondary | ICD-10-CM | POA: Diagnosis not present

## 2021-11-06 DIAGNOSIS — N393 Stress incontinence (female) (male): Secondary | ICD-10-CM

## 2021-11-06 DIAGNOSIS — Z01818 Encounter for other preprocedural examination: Secondary | ICD-10-CM

## 2021-11-06 HISTORY — PX: CYSTOCELE REPAIR: SHX163

## 2021-11-06 HISTORY — DX: Generalized anxiety disorder: F41.1

## 2021-11-06 HISTORY — PX: BLADDER SUSPENSION: SHX72

## 2021-11-06 HISTORY — DX: Personal history of adenomatous and serrated colon polyps: Z86.0101

## 2021-11-06 HISTORY — DX: Stress incontinence (female) (male): N39.3

## 2021-11-06 HISTORY — DX: Personal history of colonic polyps: Z86.010

## 2021-11-06 HISTORY — DX: Family history of other specified conditions: Z84.89

## 2021-11-06 HISTORY — PX: CYSTOSCOPY: SHX5120

## 2021-11-06 LAB — TYPE AND SCREEN
ABO/RH(D): O POS
Antibody Screen: NEGATIVE

## 2021-11-06 LAB — ABO/RH: ABO/RH(D): O POS

## 2021-11-06 SURGERY — URETHROPEXY, USING TRANSVAGINAL TAPE
Anesthesia: General | Site: Vagina

## 2021-11-06 MED ORDER — ACETAMINOPHEN 500 MG PO TABS
ORAL_TABLET | ORAL | Status: AC
  Filled 2021-11-06: qty 2

## 2021-11-06 MED ORDER — DEXAMETHASONE SODIUM PHOSPHATE 10 MG/ML IJ SOLN
INTRAMUSCULAR | Status: DC | PRN
  Administered 2021-11-06: 10 mg via INTRAVENOUS

## 2021-11-06 MED ORDER — ONDANSETRON HCL 4 MG/2ML IJ SOLN
INTRAMUSCULAR | Status: AC
  Filled 2021-11-06: qty 2

## 2021-11-06 MED ORDER — DEXAMETHASONE SODIUM PHOSPHATE 10 MG/ML IJ SOLN
INTRAMUSCULAR | Status: AC
  Filled 2021-11-06: qty 1

## 2021-11-06 MED ORDER — KETOROLAC TROMETHAMINE 30 MG/ML IJ SOLN
INTRAMUSCULAR | Status: AC
  Filled 2021-11-06: qty 1

## 2021-11-06 MED ORDER — SODIUM CHLORIDE 0.9 % IR SOLN
Status: DC | PRN
  Administered 2021-11-06: 500 mL

## 2021-11-06 MED ORDER — OXYCODONE HCL 5 MG PO TABS
5.0000 mg | ORAL_TABLET | ORAL | Status: DC | PRN
  Administered 2021-11-06: 5 mg via ORAL

## 2021-11-06 MED ORDER — EPHEDRINE SULFATE-NACL 50-0.9 MG/10ML-% IV SOSY
PREFILLED_SYRINGE | INTRAVENOUS | Status: DC | PRN
  Administered 2021-11-06 (×2): 10 mg via INTRAVENOUS

## 2021-11-06 MED ORDER — CEFAZOLIN SODIUM-DEXTROSE 2-4 GM/100ML-% IV SOLN
2.0000 g | INTRAVENOUS | Status: AC
  Administered 2021-11-06: 2 g via INTRAVENOUS

## 2021-11-06 MED ORDER — LIDOCAINE 2% (20 MG/ML) 5 ML SYRINGE
INTRAMUSCULAR | Status: DC | PRN
  Administered 2021-11-06: 100 mg via INTRAVENOUS

## 2021-11-06 MED ORDER — ONDANSETRON HCL 4 MG/2ML IJ SOLN
INTRAMUSCULAR | Status: DC | PRN
  Administered 2021-11-06: 4 mg via INTRAVENOUS

## 2021-11-06 MED ORDER — NEOSTIGMINE METHYLSULFATE 3 MG/3ML IV SOSY
PREFILLED_SYRINGE | INTRAVENOUS | Status: AC
  Filled 2021-11-06: qty 3

## 2021-11-06 MED ORDER — POVIDONE-IODINE 10 % EX SWAB: 2.0000 "application " | Freq: Once | CUTANEOUS | Status: DC

## 2021-11-06 MED ORDER — HYDROMORPHONE HCL 1 MG/ML IJ SOLN: 0.2500 mg | INTRAMUSCULAR | Status: DC | PRN

## 2021-11-06 MED ORDER — LACTATED RINGERS IV SOLN
INTRAVENOUS | Status: DC
Start: 2021-11-06 — End: 2021-11-07

## 2021-11-06 MED ORDER — SOD CITRATE-CITRIC ACID 500-334 MG/5ML PO SOLN: 30.0000 mL | ORAL | Status: DC

## 2021-11-06 MED ORDER — LIDOCAINE HCL (PF) 2 % IJ SOLN
INTRAMUSCULAR | Status: AC
  Filled 2021-11-06: qty 5

## 2021-11-06 MED ORDER — ACETAMINOPHEN 500 MG PO TABS
1000.0000 mg | ORAL_TABLET | Freq: Once | ORAL | Status: AC
  Administered 2021-11-06: 1000 mg via ORAL

## 2021-11-06 MED ORDER — LACTATED RINGERS IV SOLN: INTRAVENOUS | Status: DC

## 2021-11-06 MED ORDER — LIDOCAINE-EPINEPHRINE (PF) 1 %-1:200000 IJ SOLN
INTRAMUSCULAR | Status: DC | PRN
  Administered 2021-11-06: 7 mL

## 2021-11-06 MED ORDER — EPHEDRINE 5 MG/ML INJ
INTRAVENOUS | Status: AC
  Filled 2021-11-06: qty 5

## 2021-11-06 MED ORDER — SODIUM CHLORIDE 0.9 % IR SOLN
Status: DC | PRN
  Administered 2021-11-06: 1000 mL

## 2021-11-06 MED ORDER — FENTANYL CITRATE (PF) 100 MCG/2ML IJ SOLN
INTRAMUSCULAR | Status: DC | PRN
  Administered 2021-11-06: 100 ug via INTRAVENOUS

## 2021-11-06 MED ORDER — PROPOFOL 10 MG/ML IV BOLUS
INTRAVENOUS | Status: DC | PRN
  Administered 2021-11-06: 200 mg via INTRAVENOUS

## 2021-11-06 MED ORDER — CEFAZOLIN SODIUM-DEXTROSE 2-4 GM/100ML-% IV SOLN
INTRAVENOUS | Status: AC
  Filled 2021-11-06: qty 100

## 2021-11-06 MED ORDER — OXYCODONE HCL 5 MG PO TABS
ORAL_TABLET | ORAL | Status: AC
  Filled 2021-11-06: qty 1

## 2021-11-06 MED ORDER — MIDAZOLAM HCL 5 MG/5ML IJ SOLN
INTRAMUSCULAR | Status: DC | PRN
Start: 2021-11-06 — End: 2021-11-06
  Administered 2021-11-06: 2 mg via INTRAVENOUS

## 2021-11-06 MED ORDER — KETOROLAC TROMETHAMINE 30 MG/ML IJ SOLN
INTRAMUSCULAR | Status: DC | PRN
  Administered 2021-11-06: 30 mg via INTRAVENOUS

## 2021-11-06 MED ORDER — FENTANYL CITRATE (PF) 250 MCG/5ML IJ SOLN
INTRAMUSCULAR | Status: AC
  Filled 2021-11-06: qty 5

## 2021-11-06 MED ORDER — OXYCODONE-ACETAMINOPHEN 5-325 MG PO TABS: 1.0000 | ORAL_TABLET | ORAL | 0 refills | Status: DC | PRN

## 2021-11-06 MED ORDER — MIDAZOLAM HCL 2 MG/2ML IJ SOLN
INTRAMUSCULAR | Status: AC
  Filled 2021-11-06: qty 2

## 2021-11-06 MED ORDER — FUROSEMIDE 10 MG/ML IJ SOLN
INTRAMUSCULAR | Status: DC | PRN
  Administered 2021-11-06: 10 mg via INTRAMUSCULAR

## 2021-11-06 MED ORDER — GLYCOPYRROLATE PF 0.2 MG/ML IJ SOSY
PREFILLED_SYRINGE | INTRAMUSCULAR | Status: AC
  Filled 2021-11-06: qty 1

## 2021-11-06 MED ORDER — FLUORESCEIN SODIUM 10 % IV SOLN
INTRAVENOUS | Status: DC | PRN
  Administered 2021-11-06: 1 mL via INTRAVENOUS

## 2021-11-06 MED ORDER — ESTRADIOL 0.1 MG/GM VA CREA
TOPICAL_CREAM | VAGINAL | Status: DC | PRN
Start: 2021-11-06 — End: 2021-11-06
  Administered 2021-11-06: 1 via VAGINAL

## 2021-11-06 MED ORDER — FLUORESCEIN SODIUM 10 % IV SOLN
INTRAVENOUS | Status: AC
  Filled 2021-11-06: qty 5

## 2021-11-06 SURGICAL SUPPLY — 36 items
ADH SKN CLS APL DERMABOND .7 (GAUZE/BANDAGES/DRESSINGS) ×2
AGENT HMST KT MTR STRL THRMB (HEMOSTASIS) ×2
BLADE SURG 15 STRL LF DISP TIS (BLADE) ×6 IMPLANT
BLADE SURG 15 STRL SS (BLADE) ×6
DERMABOND ADVANCED (GAUZE/BANDAGES/DRESSINGS) ×1
DERMABOND ADVANCED .7 DNX12 (GAUZE/BANDAGES/DRESSINGS) ×3 IMPLANT
GAUZE 4X4 16PLY ~~LOC~~+RFID DBL (SPONGE) ×4 IMPLANT
GAUZE PACKING 2X5 YD STRL (GAUZE/BANDAGES/DRESSINGS) ×4 IMPLANT
GLOVE BIOGEL PI IND STRL 7.0 (GLOVE) ×6 IMPLANT
GLOVE BIOGEL PI INDICATOR 7.0 (GLOVE) ×2
GLOVE SURG POLYISO LF SZ6 (GLOVE) ×1 IMPLANT
GLOVE SURG POLYISO LF SZ7 (GLOVE) ×1 IMPLANT
GLOVE SURG UNDER POLY LF SZ6.5 (GLOVE) ×2 IMPLANT
GLOVE SURG UNDER POLY LF SZ7 (GLOVE) ×1 IMPLANT
GLOVE SURG UNDER POLY LF SZ7.5 (GLOVE) ×3 IMPLANT
GOWN STRL REUS W/TWL LRG LVL3 (GOWN DISPOSABLE) ×14 IMPLANT
IV NS 1000ML (IV SOLUTION) ×3
IV NS 1000ML BAXH (IV SOLUTION) IMPLANT
KIT TURNOVER CYSTO (KITS) ×4 IMPLANT
MANIFOLD NEPTUNE II (INSTRUMENTS) ×1 IMPLANT
MARKER SKIN DUAL TIP RULER LAB (MISCELLANEOUS) IMPLANT
NEEDLE HYPO 22GX1.5 SAFETY (NEEDLE) ×4 IMPLANT
NS IRRIG 500ML POUR BTL (IV SOLUTION) ×1 IMPLANT
PACK VAGINAL WOMENS (CUSTOM PROCEDURE TRAY) ×4 IMPLANT
SET IRRIG Y TYPE TUR BLADDER L (SET/KITS/TRAYS/PACK) ×4 IMPLANT
SLING TRANS VAGINAL TAPE (Sling) ×1 IMPLANT
SLING UTERINE/ABD GYNECARE TVT (Sling) ×3 IMPLANT
SURGIFLO W/THROMBIN 8M KIT (HEMOSTASIS) ×1 IMPLANT
SUT VIC AB 0 CT1 18XCR BRD8 (SUTURE) ×3 IMPLANT
SUT VIC AB 0 CT1 8-18 (SUTURE) ×3
SUT VIC AB 2-0 CT1 (SUTURE) ×4 IMPLANT
SUT VIC AB 2-0 CT1 27 (SUTURE) ×3
SUT VIC AB 2-0 CT1 TAPERPNT 27 (SUTURE) ×3 IMPLANT
SUT VIC AB 2-0 UR6 27 (SUTURE) IMPLANT
TOWEL OR 17X26 10 PK STRL BLUE (TOWEL DISPOSABLE) ×7 IMPLANT
TRAY FOLEY W/BAG SLVR 14FR LF (SET/KITS/TRAYS/PACK) ×4 IMPLANT

## 2021-11-06 NOTE — Anesthesia Procedure Notes (Signed)
Procedure Name: LMA Insertion ?Date/Time: 11/06/2021 11:12 AM ?Performed by: Bonney Aid, CRNA ?Pre-anesthesia Checklist: Patient identified, Emergency Drugs available, Suction available and Patient being monitored ?Patient Re-evaluated:Patient Re-evaluated prior to induction ?Oxygen Delivery Method: Circle system utilized ?Preoxygenation: Pre-oxygenation with 100% oxygen ?Induction Type: IV induction ?Ventilation: Mask ventilation without difficulty ?LMA: LMA inserted ?LMA Size: 4.0 ?Number of attempts: 1 ?Airway Equipment and Method: Bite block ?Placement Confirmation: positive ETCO2 ?Tube secured with: Tape ?Dental Injury: Teeth and Oropharynx as per pre-operative assessment  ? ? ? ? ?

## 2021-11-06 NOTE — Transfer of Care (Signed)
Immediate Anesthesia Transfer of Care Note ? ?Patient: Samantha Terrell ? ?Procedure(s) Performed: Procedure(s) (LRB): ?TRANSVAGINAL TAPE (TVT) PROCEDURE (N/A) ?ANTERIOR REPAIR (CYSTOCELE) (N/A) ?CYSTOSCOPY (N/A) ? ?Patient Location: PACU ? ?Anesthesia Type: General ? ?Level of Consciousness: awake, oriented, sedated and patient cooperative ? ?Airway & Oxygen Therapy: Patient Spontanous Breathing and Patient connected to face mask oxygen ? ?Post-op Assessment: Report given to PACU RN and Post -op Vital signs reviewed and stable ? ?Post vital signs: Reviewed and stable ? ?Complications: No apparent anesthesia complications ? ?Last Vitals:  ?Vitals Value Taken Time  ?BP 116/67 11/06/21 1221  ?Temp 36.4 ?C 11/06/21 1221  ?Pulse 65 11/06/21 1223  ?Resp 14 11/06/21 1222  ?SpO2 100 % 11/06/21 1223  ?Vitals shown include unvalidated device data. ? ?Last Pain:  ?Vitals:  ? 11/06/21 0939  ?TempSrc: Oral  ?PainSc: 0-No pain  ?   ? ?Patients Stated Pain Goal: 5 (11/06/21 8563) ? ?Complications: No notable events documented. ?

## 2021-11-06 NOTE — Progress Notes (Signed)
There has been no change in the patients history, status or exam since the history and physical. ? ?Vitals:  ? 10/30/21 1017 11/06/21 0939  ?BP:  129/73  ?Pulse:  63  ?Resp:  17  ?Temp:  98.2 ?F (36.8 ?C)  ?TempSrc:  Oral  ?SpO2:  100%  ?Weight: 85.7 kg 85.3 kg  ?Height: 5' 7.5" (1.715 m) 5' 7.5" (1.715 m)  ? ? ?Results for orders placed or performed during the hospital encounter of 11/06/21 (from the past 72 hour(s))  ?ABO/Rh     Status: None (Preliminary result)  ? Collection Time: 11/06/21 10:00 AM  ?Result Value Ref Range  ? ABO/RH(D) PENDING   ? ? ?Samantha Terrell  ?

## 2021-11-06 NOTE — Discharge Instructions (Addendum)
Pt to remove vaginal pack tomorrow am= take pain med before doing so.   ? ?Keep Foley in place- give patient leg bag and show how to change bags.  Instruct on care.  voiding trial at office- call and come in at 830 am on Friday. ? ?[place] ? ?Activity: ? -No lifting greater than 10-15 pounds for 1 week, or as instructed by your physician. ?            -No sexual intercourse until your f/u visit ?Diet: ? You may return to your normal diet tomorrow.   It is important to keep your bowels regular during the postoperative period.  To avoid constipation, drink plenty of fluids during the day (8-10 glasses) and eat plenty of fresh fruits and vegetables.  Use a mild laxative or stool softener if necessary. ? ?Wound Care: ? You may begin showering tomorrow, or as instructed by your physician.      ?Return to Work as instructed by your physician.   ? ?Special Instructions: ? ? Call your physician if any of these symptoms occur: ?  -temperature greater than 101 degrees Farenheit. ?  -redness, swelling or drainage at incision site. ?  -foul odor of your urine. ?  -a significant decrease in the amount of urine you have every day. ?  -severe pain not relieved by your pain medication. ? ?Return to see your doctor .   ?Call to set up a follow-up appointment. ? ?Post Anesthesia Home Care Instructions ? ?Activity: ?Get plenty of rest for the remainder of the day. A responsible individual must stay with you for 24 hours following the procedure.  ?For the next 24 hours, DO NOT: ?-Drive a car ?-Paediatric nurse ?-Drink alcoholic beverages ?-Take any medication unless instructed by your physician ?-Make any legal decisions or sign important papers. ? ?Meals: ?Start with liquid foods such as gelatin or soup. Progress to regular foods as tolerated. Avoid greasy, spicy, heavy foods. If nausea and/or vomiting occur, drink only clear liquids until the nausea and/or vomiting subsides. Call your physician if  vomiting continues. ? ?Special Instructions/Symptoms: ?Your throat may feel dry or sore from the anesthesia or the breathing tube placed in your throat during surgery. If this causes discomfort, gargle with warm salt water. The discomfort should disappear within 24 hours. ? ?No acetaminophen/Tylenol until after 3:45 pm today if needed. ?No ibuprofen, Advil, Aleve, Motrin, ketorolac, meloxicam, naproxen, or other NSAIDS until after 5:15 pm today if needed. ? ? ? ?

## 2021-11-06 NOTE — Brief Op Note (Signed)
11/06/2021 ? ?12:13 PM ? ?PATIENT:  Samantha Terrell  61 y.o. female ? ?PRE-OPERATIVE DIAGNOSIS:  female stress incontinence ? ?POST-OPERATIVE DIAGNOSIS:  female stress incontinence ? ?PROCEDURE:  Procedure(s): ?TRANSVAGINAL TAPE (TVT) PROCEDURE (N/A) ?ANTERIOR REPAIR (CYSTOCELE) (N/A) ?CYSTOSCOPY (N/A) ? ?SURGEON:  Surgeon(s) and Role: ?   Bobbye Charleston, MD - Primary ?   Rowland Lathe, MD - Assisting ? ?ANESTHESIA:   general ? ?EBL:  50 mL  ? ?DRAINS: Urinary Catheter (Foley)  ? ?LOCAL MEDICATIONS USED:  LIDOCAINE, surgiflo and estrace cream, fluorocein ? ?SPECIMEN:  No Specimen ? ?DISPOSITION OF SPECIMEN:  N/A ? ?COUNTS:  YES ? ?TOURNIQUET:  * No tourniquets in log * ? ?DICTATION: .Note written in EPIC ? ?PLAN OF CARE: Discharge to home after PACU ? ?PATIENT DISPOSITION:  PACU - hemodynamically stable. ?  ?Delay start of Pharmacological VTE agent (>24hrs) due to surgical blood loss or risk of bleeding: not applicable ? ?

## 2021-11-06 NOTE — Op Note (Signed)
11/06/2021 ? ?12:13 PM ? ?PATIENT:  Samantha Terrell  61 y.o. female ? ?PRE-OPERATIVE DIAGNOSIS:  female stress incontinence ? ?POST-OPERATIVE DIAGNOSIS:  female stress incontinence ? ?PROCEDURE:  Procedure(s): ?TRANSVAGINAL TAPE (TVT) PROCEDURE (N/A) ?ANTERIOR REPAIR (CYSTOCELE) (N/A) ?CYSTOSCOPY (N/A) ? ?SURGEON:  Surgeon(s) and Role: ?   Bobbye Charleston, MD - Primary ?   Rowland Lathe, MD - Assisting ? ?ANESTHESIA:   general ? ?EBL:  50 mL  ? ?DRAINS: Urinary Catheter (Foley)  ? ?LOCAL MEDICATIONS USED:  LIDOCAINE, surgiflo and estrace cream, fluorocein ? ?SPECIMEN:  No Specimen ? ?DISPOSITION OF SPECIMEN:  N/A ? ?COUNTS:  YES ? ?TOURNIQUET:  * No tourniquets in log * ? ?DICTATION: .Note written in EPIC ? ?PLAN OF CARE: Discharge to home after PACU ? ?PATIENT DISPOSITION:  PACU - hemodynamically stable. ?  ?Delay start of Pharmacological VTE agent (>24hrs) due to surgical blood loss or risk of bleeding: not applicable ? ?Findings: cystocele to 0 and hypermobile urethra.  Sluggish ureters- even after lasix, low flow puffs of fluorcein seen.  Since were we no where close to the ureters during op, the assumption is that the flow is normal for her.  Cr preop was 0.97.  Consider consult with neprhology post op. No needles in bladder.  ? ?Comp: none. ? ?Technique: ? ?After general anesthesia was administered, the patient was prepped and draped in the usual sterile fashion.  The uterus was well suspended and we elected to leave it in place.  A foley was placed in the urethra and the apex of the cystocele grasped with two allises.  Allises were used to grasp the vaginal mucosa in the midline superior to inferior just below the urethral opening.  The mucosa was injected with 1% lidocaine with epi in the midline and a scalpel used to incise the vaginal mucosa vertically.  Allises were used to retract the vaginal mucosa as the vesico-vaginal fascia was removed from the mucosa with blunt and sharp dissection and  adequate room midurethral to pelvic floor bilaterally was developed.  Two small puncture incisions were then made two cm from midline just above the pubic symphysis.  The abdominal needles from the [employer] TVT set were introduced behind the pubic bone, through the pelvic floor and out the vagina carefully on each respective side, being careful to hug the posterior of the pubic bone.  The foley was removed and Fluorocein given.  Cystoscopy revealed excellent bilateral spill from each ureteral orifice and NO NEEDLES IN THE BLADDER.   The urethra was clear as well.  The foley was replaced and the vaginal needles attached to the abdominal needles.  The tape was pulled into place through the abdominal incisions, the sheaths removed while a kelly was in place under the mesh sling and the tape cut at the level of just below the skin.  The tape was checked and found to be tight enough and laying flat.   ?Surgiflow was placed in the abdominal incisions and placed in the corners up by the pelvic floor to achieve hemostasis of some small bleeders out of reach and too close to the sling for stitches.  Once hemostasis was achieved, four mattress stitches of 0-vicryl were placed to close the vesico-vaginal fascia under the bladder.  The vaginal mucosa was trimmed and then closed with a running lock stitch of 2-0 vicryl.  A vaginal packing with estrace was placed and the patient returned to the recovery room in stable condition.    ?  ?

## 2021-11-06 NOTE — Anesthesia Postprocedure Evaluation (Signed)
Anesthesia Post Note ? ?Patient: Samantha Terrell ? ?Procedure(s) Performed: TRANSVAGINAL TAPE (TVT) PROCEDURE (Vagina ) ?ANTERIOR REPAIR (CYSTOCELE) (Vagina ) ?CYSTOSCOPY (Bladder) ? ?  ? ?Patient location during evaluation: PACU ?Anesthesia Type: General ?Level of consciousness: awake and alert ?Pain management: pain level controlled ?Vital Signs Assessment: post-procedure vital signs reviewed and stable ?Respiratory status: spontaneous breathing, nonlabored ventilation and respiratory function stable ?Cardiovascular status: blood pressure returned to baseline and stable ?Postop Assessment: no apparent nausea or vomiting ?Anesthetic complications: no ? ? ?No notable events documented. ? ?Last Vitals:  ?Vitals:  ? 11/06/21 1300 11/06/21 1410  ?BP: 118/67 135/60  ?Pulse: (!) 57 69  ?Resp: 14 19  ?Temp:    ?SpO2: 98% 97%  ?  ?Last Pain:  ?Vitals:  ? 11/06/21 1410  ?TempSrc:   ?PainSc: 1   ? ? ?  ?  ?  ?  ?  ?  ? ?Nariyah Osias,W. EDMOND ? ? ? ? ?

## 2021-11-07 ENCOUNTER — Encounter (HOSPITAL_BASED_OUTPATIENT_CLINIC_OR_DEPARTMENT_OTHER): Payer: Self-pay | Admitting: Obstetrics and Gynecology

## 2021-11-20 ENCOUNTER — Encounter: Payer: Self-pay | Admitting: Adult Health

## 2021-11-20 ENCOUNTER — Ambulatory Visit (INDEPENDENT_AMBULATORY_CARE_PROVIDER_SITE_OTHER): Payer: 59 | Admitting: Adult Health

## 2021-11-20 VITALS — BP 122/76 | HR 63 | Temp 97.7°F | Wt 190.0 lb

## 2021-11-20 DIAGNOSIS — Z79899 Other long term (current) drug therapy: Secondary | ICD-10-CM

## 2021-11-20 DIAGNOSIS — E669 Obesity, unspecified: Secondary | ICD-10-CM

## 2021-11-20 DIAGNOSIS — F3342 Major depressive disorder, recurrent, in full remission: Secondary | ICD-10-CM

## 2021-11-20 DIAGNOSIS — E66811 Obesity, class 1: Secondary | ICD-10-CM

## 2021-11-20 DIAGNOSIS — E559 Vitamin D deficiency, unspecified: Secondary | ICD-10-CM

## 2021-11-20 DIAGNOSIS — F419 Anxiety disorder, unspecified: Secondary | ICD-10-CM

## 2021-11-20 DIAGNOSIS — I10 Essential (primary) hypertension: Secondary | ICD-10-CM | POA: Diagnosis not present

## 2021-11-20 DIAGNOSIS — E785 Hyperlipidemia, unspecified: Secondary | ICD-10-CM | POA: Diagnosis not present

## 2021-11-20 DIAGNOSIS — E538 Deficiency of other specified B group vitamins: Secondary | ICD-10-CM

## 2021-11-20 DIAGNOSIS — I493 Ventricular premature depolarization: Secondary | ICD-10-CM

## 2021-11-20 MED ORDER — METOPROLOL SUCCINATE ER 25 MG PO TB24: ORAL_TABLET | ORAL | 2 refills | Status: DC

## 2021-11-20 NOTE — Progress Notes (Signed)
6 MONTH FOLLOW UP ? ?Assessment and Plan: ? ?Essential hypertension ?Monitor blood pressure at home; call if consistently over 130/80 ?Continue DASH diet.   ?Reminder to go to the ER if any CP, SOB, nausea, dizziness, severe HA, changes vision/speech, left arm numbness and tingling and jaw pain. ? ?Intermittent palpitations/Sympomatic PVCs ?Triggered by stress/lack of sleep - encouraged lifestyle improvement ?Continue to reduce caffeine, alcohol  ?PVCs per monitor in 2016 ?Currently on bisoprolol 10 mg, verapamil 120 CR mg  ?Still poorly controlled, will try stopping bisoprolol, try toprol instead ?Start with 25 mg daily x 2 weeks, increase to 50 mg if needed to control sx ? ?Vitamin D deficiency ?Continue supplementation ? ?Medication management ?-     CBC with Differential/Platelet ?-     COMPLETE METABOLIC PANEL WITH GFR ? ?Hyperlipidemia, unspecified hyperlipidemia type ?Continue fiber supplement ?Continue low cholesterol diet and exercise.  ?Check lipid panel today ?-     Lipid panel ?-     TSH ? ?Recurrent major depressive disorder, in full remission (HCC)/ Anxiety ?Controlled; Continue zoloft 50 mg daily ?Lifestyle discussed: diet/exerise, sleep hygiene, stress management, hydration ? ?Overweight (BMI 25.0-29.9) ?Long discussion about weight loss, diet, and exercise ?Recommended diet heavy in fruits and veggies and low in animal meats, cheeses, and dairy products, appropriate calorie intake ?Discussed appropriate weight for height ?Follow up at next visit ? ?B12 def ?- not on supplement, restart and check next OV  ? ?Orders Placed This Encounter  ?Procedures  ? CBC with Differential/Platelet  ? COMPLETE METABOLIC PANEL WITH GFR  ? Lipid panel  ? TSH  ? ? ? ?Discussed med's effects and SE's. Labs and tests as requested with regular follow-up as recommended. ?Over 30 minutes of exam, counseling, chart review, and complex, high level critical decision making was performed this visit.  ? ?Future Appointments   ?Date Time Provider [place]  ?03/26/2022  3:00 PM Liane Comber, NP [facility] None  ? ? ?HPI  ?61 y.o. female  presents for 6 month follow up. She has Hypertension; Depression; Anxiety; Hyperlipidemia; Atrial septal aneurysm; Medication management; Vitamin D deficiency; Obesity (BMI 30.0-34.9); Pelvic prolapse with cystocele; Intermittent palpitations; History of adenomatous polyp of colon; B12 deficiency; Female pattern baldness; and Symptomatic PVCs on their problem list.  ? ?She has been following with Dr. Elwin Sleight for pelvic prolapse; recently in 11/06/2021 had TVT procedure with cystocele repair and cysto, has some mild spotting, has post op follow up. Per op note considering nephrology eval due to slow ureteral flow.  ? ?She is on zoloft 50 mg daily for anxiety and feels well controlled ? ?BMI is Body mass index is 29.32 kg/m?., she has been working on diet and exercise, she has been cutting down on ice cream/sweets, avoids red meat, chooses chicken or seafood. Watches sugar/carbs closely.  ?Holding off of treadmill due to surgery  ?Wt Readings from Last 3 Encounters:  ?11/20/21 190 lb (86.2 kg)  ?11/06/21 188 lb 1.6 oz (85.3 kg)  ?06/07/21 191 lb (86.6 kg)  ? ?Has seen Dr. Fidela Juneau in the past, per previous provider note at the time she had echo with LVH, atrial septal aneursym without PFO.  ?Holter in 2016 showed PVCs without tachyarrhythmia.  ?She is on bisoprolol 10 mg and verapamil 120 mg for BP and symptomatic PVCs  ? ?She reports BP is well controlled, today their BP is BP: 122/76.  ?She does workout. She denies chest pain, shortness of breath, dizziness.  ? ?She is not on cholesterol  medication (is on fiber supplement). Her cholesterol is not at goal. She has been working on diet for cholesterol. The cholesterol last visit was:   ?Lab Results  ?Component Value Date  ? CHOL 194 03/25/2021  ? HDL 59 03/25/2021  ? LDLCALC 112 (H) 03/25/2021  ? TRIG 120 03/25/2021  ? CHOLHDL 3.3 03/25/2021  ? ? Last  A1C in the office was:   ?Lab Results  ?Component Value Date  ? HGBA1C 5.3 03/25/2021  ? ?Last GFR: ?Lab Results  ?Component Value Date  ? EGFR 88 03/25/2021  ? ?Patient is on Vitamin D supplement, taking 1000 IU  ?Lab Results  ?Component Value Date  ? VD25OH 65 03/25/2021  ?   ?B12 borderline low in 2019, had been on a daily supplement but reports stopped/has been out:  ?Lab Results  ?Component Value Date  ? [id number] 358 12/19/2019  ? ? ?Current Medications:  ?Current Outpatient Medications on File Prior to Visit  ?Medication Sig Dispense Refill  ? aspirin EC 81 MG tablet Take 81 mg by mouth at bedtime. Swallow whole.    ? norethindrone-ethinyl estradiol (FEMHRT 1/5) 1-5 MG-MCG TABS tablet Take 1 tablet by mouth at bedtime.    ? OVER THE COUNTER MEDICATION Take 5,000 mcg by mouth 2 (two) times a day. DHT blocker    ? sertraline (ZOLOFT) 100 MG tablet Take 50 mg by mouth daily with lunch.    ? verapamil (CALAN-SR) 120 MG CR tablet TAKE 1 TABLET BY MOUTH EVERYDAY AT BEDTIME FOR PALPITATIONS. (Patient taking differently: 120 mg at bedtime. TAKE 1 TABLET BY MOUTH EVERYDAY AT BEDTIME FOR PALPITATIONS.) 90 tablet 1  ? Cyanocobalamin (VITAMIN B-12 PO) Take by mouth. (Patient not taking: Reported on 11/20/2021)    ? MAGNESIUM PO Take by mouth. (Patient not taking: Reported on 10/30/2021)    ? Omega-3 Fatty Acids (FISH OIL PO) Take by mouth. (Patient not taking: Reported on 10/30/2021)    ? oxyCODONE-acetaminophen (PERCOCET/ROXICET) 5-325 MG tablet Take 1-2 tablets by mouth every 4 (four) hours as needed for severe pain. (Patient not taking: Reported on 11/20/2021) 30 tablet 0  ? Vitamin D, Cholecalciferol, 1000 UNITS CAPS Take by mouth at bedtime. (Patient not taking: Reported on 11/20/2021)    ? ?No current facility-administered medications on file prior to visit.  ? ?Allergies:  ?Allergies  ?Allergen Reactions  ? Latex Itching  ? ?Medical History:  ?She has Hypertension; Depression; Anxiety; Hyperlipidemia; Atrial septal  aneurysm; Medication management; Vitamin D deficiency; Obesity (BMI 30.0-34.9); Pelvic prolapse with cystocele; Intermittent palpitations; History of adenomatous polyp of colon; B12 deficiency; Female pattern baldness; and Symptomatic PVCs on their problem list.  ? ?Surgical History:  ?She has a past surgical history that includes Colonoscopy with propofol (06/07/2021); Nasal septum surgery (1986); Bladder suspension (N/A, 11/06/2021); Cystocele repair (N/A, 11/06/2021); and Cystoscopy (N/A, 11/06/2021). ?Family History:  ?Herfamily history includes Arthritis in her maternal grandfather and mother; Cancer in her maternal grandmother; Congestive Heart Failure in her maternal grandmother; Heart attack in her maternal grandmother; Hyperlipidemia in her father; Osteoporosis in her maternal grandmother and paternal grandmother; Vascular Disease in her maternal grandfather. ?Social History:  ?She reports that she has never smoked. She has never used smokeless tobacco. She reports current alcohol use of about 6.0 standard drinks per week. She reports that she does not use drugs. ? ?Review of Systems: ?Review of Systems  ?Constitutional:  Negative for malaise/fatigue and weight loss.  ?HENT:  Negative for hearing loss and tinnitus.   ?Eyes:  Negative for blurred vision and double vision.  ?Respiratory:  Negative for cough, sputum production, shortness of breath and wheezing.   ?Cardiovascular:  Positive for palpitations (increased with lack of sleep). Negative for chest pain, orthopnea, claudication, leg swelling and PND.  ?Gastrointestinal:  Negative for abdominal pain, blood in stool, constipation, diarrhea, heartburn, melena, nausea and vomiting.  ?Genitourinary: Negative.   ?Musculoskeletal:  Negative for falls, joint pain and myalgias.  ?Skin:  Negative for rash.  ?Neurological:  Negative for dizziness, tingling, sensory change, weakness and headaches.  ?Endo/Heme/Allergies:  Negative for polydipsia.   ?Psychiatric/Behavioral: Negative.  Negative for depression, memory loss, substance abuse and suicidal ideas. The patient is not nervous/anxious and does not have insomnia.   ?All other systems reviewed and are negative. ? ?Phy

## 2021-11-21 LAB — CBC WITH DIFFERENTIAL/PLATELET
Absolute Monocytes: 745 cells/uL (ref 200–950)
Basophils Absolute: 53 cells/uL (ref 0–200)
Basophils Relative: 0.7 %
Eosinophils Absolute: 122 cells/uL (ref 15–500)
Eosinophils Relative: 1.6 %
HCT: 42 % (ref 35.0–45.0)
Hemoglobin: 14.2 g/dL (ref 11.7–15.5)
Lymphs Abs: 1953 cells/uL (ref 850–3900)
MCH: 31.1 pg (ref 27.0–33.0)
MCHC: 33.8 g/dL (ref 32.0–36.0)
MCV: 91.9 fL (ref 80.0–100.0)
MPV: 9.6 fL (ref 7.5–12.5)
Monocytes Relative: 9.8 %
Neutro Abs: 4727 cells/uL (ref 1500–7800)
Neutrophils Relative %: 62.2 %
Platelets: 315 10*3/uL (ref 140–400)
RBC: 4.57 10*6/uL (ref 3.80–5.10)
RDW: 12.1 % (ref 11.0–15.0)
Total Lymphocyte: 25.7 %
WBC: 7.6 10*3/uL (ref 3.8–10.8)

## 2021-11-21 LAB — COMPLETE METABOLIC PANEL WITH GFR
AG Ratio: 1.8 (calc) (ref 1.0–2.5)
ALT: 17 U/L (ref 6–29)
AST: 19 U/L (ref 10–35)
Albumin: 4.4 g/dL (ref 3.6–5.1)
Alkaline phosphatase (APISO): 83 U/L (ref 37–153)
BUN: 13 mg/dL (ref 7–25)
CO2: 27 mmol/L (ref 20–32)
Calcium: 10 mg/dL (ref 8.6–10.4)
Chloride: 103 mmol/L (ref 98–110)
Creat: 0.77 mg/dL (ref 0.50–1.03)
Globulin: 2.4 g/dL (calc) (ref 1.9–3.7)
Glucose, Bld: 88 mg/dL (ref 65–99)
Potassium: 5.1 mmol/L (ref 3.5–5.3)
Sodium: 138 mmol/L (ref 135–146)
Total Bilirubin: 0.6 mg/dL (ref 0.2–1.2)
Total Protein: 6.8 g/dL (ref 6.1–8.1)
eGFR: 89 mL/min/{1.73_m2} (ref >=60)

## 2021-11-21 LAB — TSH: TSH: 2.22 mIU/L (ref 0.40–4.50)

## 2021-11-21 LAB — LIPID PANEL
Cholesterol: 209 mg/dL — ABNORMAL HIGH (ref <200)
HDL: 55 mg/dL (ref >=50)
LDL Cholesterol (Calc): 128 mg/dL (calc) — ABNORMAL HIGH
Non-HDL Cholesterol (Calc): 154 mg/dL (calc) — ABNORMAL HIGH (ref <130)
Total CHOL/HDL Ratio: 3.8 (calc) (ref <5.0)
Triglycerides: 150 mg/dL — ABNORMAL HIGH (ref <150)

## 2021-12-03 ENCOUNTER — Other Ambulatory Visit: Payer: Self-pay | Admitting: Adult Health

## 2021-12-03 ENCOUNTER — Encounter: Payer: Self-pay | Admitting: Adult Health

## 2021-12-03 DIAGNOSIS — I10 Essential (primary) hypertension: Secondary | ICD-10-CM

## 2021-12-15 ENCOUNTER — Encounter: Payer: Self-pay | Admitting: Adult Health

## 2021-12-18 ENCOUNTER — Encounter: Payer: 59 | Admitting: Adult Health

## 2021-12-19 LAB — HM PAP SMEAR: HM Pap smear: NORMAL

## 2021-12-19 LAB — HM MAMMOGRAPHY

## 2021-12-29 ENCOUNTER — Other Ambulatory Visit: Payer: Self-pay | Admitting: Internal Medicine

## 2021-12-29 DIAGNOSIS — I493 Ventricular premature depolarization: Secondary | ICD-10-CM

## 2021-12-29 MED ORDER — METOPROLOL SUCCINATE ER 25 MG PO TB24: ORAL_TABLET | ORAL | 1 refills | Status: DC

## 2021-12-30 ENCOUNTER — Encounter: Payer: Self-pay | Admitting: Adult Health

## 2022-01-14 ENCOUNTER — Encounter: Payer: Self-pay | Admitting: Internal Medicine

## 2022-02-12 ENCOUNTER — Other Ambulatory Visit: Payer: Self-pay | Admitting: Nurse Practitioner

## 2022-02-12 ENCOUNTER — Encounter: Payer: Self-pay | Admitting: Adult Health

## 2022-02-12 DIAGNOSIS — I493 Ventricular premature depolarization: Secondary | ICD-10-CM

## 2022-02-12 MED ORDER — METOPROLOL SUCCINATE ER 25 MG PO TB24: ORAL_TABLET | ORAL | 1 refills | Status: DC

## 2022-03-10 ENCOUNTER — Ambulatory Visit (INDEPENDENT_AMBULATORY_CARE_PROVIDER_SITE_OTHER): Payer: 59 | Admitting: Nurse Practitioner

## 2022-03-10 ENCOUNTER — Other Ambulatory Visit: Payer: Self-pay | Admitting: Nurse Practitioner

## 2022-03-10 ENCOUNTER — Encounter: Payer: Self-pay | Admitting: Nurse Practitioner

## 2022-03-10 VITALS — BP 130/82 | HR 57 | Temp 97.9°F | Ht 67.5 in | Wt 192.0 lb

## 2022-03-10 DIAGNOSIS — I493 Ventricular premature depolarization: Secondary | ICD-10-CM

## 2022-03-10 DIAGNOSIS — B359 Dermatophytosis, unspecified: Secondary | ICD-10-CM | POA: Diagnosis not present

## 2022-03-10 DIAGNOSIS — B353 Tinea pedis: Secondary | ICD-10-CM | POA: Diagnosis not present

## 2022-03-10 DIAGNOSIS — I10 Essential (primary) hypertension: Secondary | ICD-10-CM | POA: Diagnosis not present

## 2022-03-10 MED ORDER — NAFTIFINE HCL 1 % EX CREA: TOPICAL_CREAM | CUTANEOUS | 0 refills | Status: DC

## 2022-03-10 NOTE — Progress Notes (Unsigned)
Hydrocortisone Imuad Fungal injection 2 months - May   Assessment and Plan:  Samantha Terrell was seen today for a ***.  Diagnoses and all order for this visit:  There are no diagnoses linked to this encounter.   Continue to monitor for any increase in fever, chills, N/V, diarrhea, changes to bowel habits, blood in stool.  Notify office for further evaluation and treatment, questions or concerns if s/s fail to improve. The risks and benefits of my recommendations, as well as other treatment options were discussed with the patient today. Questions were answered.  Further disposition pending results of labs. Discussed med's effects and SE's.    Over *** minutes of exam, counseling, chart review, and critical decision making was performed.   Future Appointments  Date Time Provider [place]  03/26/2022  3:00 PM Kerim Statzer, Kenney Houseman, NP [facility] None    ------------------------------------------------------------------------------------------------------------------   HPI BP 130/82   Pulse (!) 57   Temp 97.9 F (36.6 C)   Ht 5' 7.5" (1.715 m)   Wt 192 lb (87.1 kg)   LMP  (LMP Unknown)   SpO2 98%   BMI 29.63 kg/m  61 y.o.female presents for  Past Medical History:  Diagnosis Date   Degenerative disc disease, cervical    Depression    Family history of adverse reaction to anesthesia    mother-- ponv   GAD (generalized anxiety disorder)    History of adenomatous polyp of colon    History of COVID-19 11/2020   per pt mild symptoms that resolved   Hypertension    PVC (premature ventricular contraction)    symptomatic intermittant palpitations  (event monitor in epic 12-03-2014 showed SR w/ PVCs)   SUI (stress urinary incontinence, female)      Allergies  Allergen Reactions   Latex Itching    Current Outpatient Medications on File Prior to Visit  Medication Sig   aspirin EC 81 MG tablet Take 81 mg by mouth at bedtime. Swallow whole.   Magnesium 500 MG TABS  Take by mouth daily.   Methylcobalamin (B12 SL) Place under the tongue daily.   metoprolol succinate (TOPROL XL) 25 MG 24 hr tablet Take 2 tab twice a day for palpitations and blood pressure.   norethindrone-ethinyl estradiol (FEMHRT 1/5) 1-5 MG-MCG TABS tablet Take 1 tablet by mouth at bedtime.   Omega-3 Fatty Acids (FISH OIL) 1000 MG CPDR Take by mouth daily.   OVER THE COUNTER MEDICATION Take 5,000 mcg by mouth 2 (two) times a day. DHT blocker   sertraline (ZOLOFT) 100 MG tablet Take 50 mg by mouth daily with lunch.   TURMERIC PO Take by mouth daily.   Vitamin D, Cholecalciferol, 1000 UNITS CAPS Take by mouth at bedtime.   verapamil (CALAN-SR) 120 MG CR tablet TAKE 1 TABLET BY MOUTH EVERYDAY AT BEDTIME FOR PALPITATIONS. (Patient not taking: Reported on 03/10/2022)   No current facility-administered medications on file prior to visit.    ROS: all negative except what is noted in the HPI.   Physical Exam:  BP 130/82   Pulse (!) 57   Temp 97.9 F (36.6 C)   Ht 5' 7.5" (1.715 m)   Wt 192 lb (87.1 kg)   LMP  (LMP Unknown)   SpO2 98%   BMI 29.63 kg/m   General Appearance: NAD.  Awake, conversant and cooperative. Eyes: PERRLA, EOMs intact.  Sclera white.  Conjunctiva without erythema. Sinuses: No frontal/maxillary tenderness.  No nasal discharge. Nares patent.  ENT/Mouth: Ext aud canals  clear.  Bilateral TMs w/DOL and without erythema or bulging. Hearing intact.  Posterior pharynx without swelling or exudate.  Tonsils without swelling or erythema.  Neck: Supple.  No masses, nodules or thyromegaly. Respiratory: Effort is regular with non-labored breathing. Breath sounds are equal bilaterally without rales, rhonchi, wheezing or stridor.  Cardio: RRR with no MRGs. Brisk peripheral pulses without edema.  Abdomen: Active BS in all four quadrants.  Soft and non-tender without guarding, rebound tenderness, hernias or masses. Lymphatics: Non tender without lymphadenopathy.  Musculoskeletal:  Full ROM, 5/5 strength, normal ambulation.  No clubbing or cyanosis. Skin: Appropriate color for ethnicity. Warm without rashes, lesions, ecchymosis, ulcers.  Neuro: CN II-XII grossly normal. Normal muscle tone without cerebellar symptoms and intact sensation.   Psych: AO X 3,  appropriate mood and affect, insight and judgment.      Darrol Jump, NP 4:24 PM [facility]

## 2022-03-11 ENCOUNTER — Other Ambulatory Visit: Payer: Self-pay | Admitting: Nurse Practitioner

## 2022-03-11 ENCOUNTER — Encounter: Payer: Self-pay | Admitting: Nurse Practitioner

## 2022-03-11 DIAGNOSIS — B359 Dermatophytosis, unspecified: Secondary | ICD-10-CM

## 2022-03-11 DIAGNOSIS — B353 Tinea pedis: Secondary | ICD-10-CM

## 2022-03-11 MED ORDER — CICLOPIROX OLAMINE 0.77 % EX CREA: TOPICAL_CREAM | Freq: Two times a day (BID) | CUTANEOUS | 0 refills | Status: DC

## 2022-03-11 MED ORDER — NAFTIFINE HCL 1 % EX CREA: TOPICAL_CREAM | CUTANEOUS | 0 refills | Status: DC

## 2022-03-13 NOTE — Patient Instructions (Signed)
Athlete's Foot  Athlete's foot (tinea pedis) is a fungal infection of the skin on your feet. It often occurs on the skin that is between or underneath your toes. It can also occur on the soles of your feet. Symptoms include itchy or white and flaky areas on the skin. The infection can spread from person to person (is contagious). It can also spread when a person's bare feet come in contact with the fungus on shower floors or on items such as shoes. Follow these instructions at home: Medicines Apply or take over-the-counter and prescription medicines only as told by your doctor. Apply your antifungal medicine as told by your doctor. Do not stop using it even if your feet start to get better. Foot care Do not scratch your feet. Keep your feet dry: Wear cotton or wool socks. Change your socks every day or if they become wet. Wear shoes that allow air to move around, such as sandals or canvas tennis shoes. Wash and dry your feet: Every day or as told by your doctor. After exercising. Including the area between your toes. General instructions Do not share any of these items that touch your feet: Towels. Shoes. Nail clippers. Other personal items. Protect your feet by wearing sandals in wet areas, such as locker rooms and shared showers. Keep all follow-up visits. If you have diabetes, keep your blood sugar under control. Contact a doctor if: You have a fever. You have swelling, pain, warmth, or redness in your foot. Your feet are not getting better with treatment. Your symptoms get worse. You have new symptoms. You have very bad pain. Summary Athlete's foot is a fungal infection of the skin on your feet. This condition is caused by a fungus that grows in warm, moist places. Symptoms include itchy or white and flaky areas on the skin. Apply your antifungal medicine as told by your doctor. Keep your feet clean and dry. This information is not intended to replace advice given to you by  your health care provider. Make sure you discuss any questions you have with your health care provider. Document Revised: 11/04/2020 Document Reviewed: 11/04/2020 [employer] Patient Education  2023 [employer]  

## 2022-03-25 NOTE — Progress Notes (Signed)
Complete Physical  Assessment and Plan:  Encounter for Annual Physical Exam with abnormal findings Due annually  Health Maintenance reviewed Healthy lifestyle reviewed and goals set Request mmg and PAP reports from GYN  New onset a-fib Eliquis 5 mg sample provided. Start 5 mg BID Refer to Cardiology Report to ER for any increase in stroke like symptoms, including HA, N/V, paralysis, difficulty speaking, trouble walking, confusion, vision changes, CP, heart palpitations, SOB, diaphoresis.  Essential hypertension Continue medications Discussed DASH (Dietary Approaches to Stop Hypertension) DASH diet is lower in sodium than a typical American diet. Cut back on foods that are high in saturated fat, cholesterol, and trans fats. Eat more whole-grain foods, fish, poultry, and nuts Remain active and exercise as tolerated daily.  Monitor BP at home-Call if greater than 130/80.  Check CMP/CBC   Intermittent palpitations/Sympomatic PVCs Triggered by stress/lack of sleep - encouraged lifestyle improvement Continue to reduce caffeine, alcohol  PVCs per monitor in 2016 Check EKG - reveals new onset a-fib.  New Onset atrial fibrillation Eliquis samples provided Refer to Cardiology Report to ER for any increase in stroke like symptoms, including HA, N/V, paralysis, difficulty speaking, trouble walking, confusion, vision changes, CP, heart palpitations, SOB, diaphoresis.    Atrial septal aneurysm Has been worked up by Dr. Wynonia Lawman in the past. Refer to Cardiology for new onset a-fib  Vitamin D deficiency Continue supplementation Check vitamin D level  Medication management All medications discussed and reviewed in full. All questions and concerns regarding medications addressed.    Hyperlipidemia, unspecified hyperlipidemia type Discussed lifestyle modifications. Recommended diet heavy in fruits and veggies, omega 3's. Decrease consumption of animal meats, cheeses, and dairy  products. Remain active and exercise as tolerated. Continue to monitor.  Recurrent major depressive disorder, in full remission (HCC)/ Anxiety Controlled; Continue zoloft Lifestyle discussed: diet/exerise, sleep hygiene, stress management, hydration  Screening for diabetes mellitus -    A1C  Screening for hematuria or proteinuria -     Urinalysis w microscopic   Overweight (BMI 25.0-29.9) Discussed appropriate BMI Goal of losing 1 lb per month. Diet modification. Physical activity. Encouraged/praised to build confidence.   Pelvic prolapse with cystocele Follows with GYN, Dr. Ulanda Edison Surgical intervention 10/2021 Currently well controlled.  History of colon polyps Adenomatous, had 6 month recall per Dr. Tarri Glenn due 05/2021 recall 05/2024 No new symptoms.   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   Screening for thyroid disorder Monitor TSH  Screening for ischemic heart disease Monitor EKG  Orders Placed This Encounter  Procedures   MICROSCOPIC MESSAGE   CBC with Differential/Platelet   COMPLETE METABOLIC PANEL WITH GFR   Magnesium   Lipid panel   TSH   Hemoglobin A1c   VITAMIN D 25 Hydroxy (Vit-D Deficiency, Fractures)   Urinalysis, Routine w reflex microscopic   Ambulatory referral to Cardiology    Referral Priority:   Routine    Referral Type:   Consultation    Referral Reason:   Specialty Services Required    Requested Specialty:   Cardiology    Number of Visits Requested:   1   EKG 12-Lead     Discussed med's effects and SE's. Screening labs and tests as requested with regular follow-up as recommended.  Over 40 minutes of exam, counseling, chart review, and complex, high level critical decision making was performed this visit.   Future Appointments  Date Time Provider Warner  03/31/2023  3:00 PM Taraoluwa Thakur, Kenney Houseman, NP [facility] None      HPI  61 y.o. female  presents for a complete physical and follow up for has Hypertension; Depression; Anxiety; Hyperlipidemia; Atrial septal aneurysm; Medication management; Vitamin D deficiency; Obesity (BMI 30.0-34.9); Pelvic prolapse with cystocele; Intermittent palpitations; History of adenomatous polyp of colon; B12 deficiency; Female pattern baldness; and Symptomatic PVCs on their problem list.   She is married, 1 son, she is doing accounting, back in office. Just back from vacation to [facility].  Mother passed from covid 53 last year.   Has mainly been seeing Dr. Philis Pique (OB/GYN) and UC, getting estrogen. She has a mild prolapse with cystocele, was doing therapy, was doing home pelvic exercises, discussing possible surgery later this year.   She had colonoscopy in 11/2020 with several polyps, was recommended 6 month follow up planned in 6 months, then planning routine follow up in 3 years.   BMI is Body mass index is 29.07 kg/m., she has been working on diet and exercise.  She runs on the treadmill 3-5 times/week for approximately 45 min.  Wt Readings from Last 3 Encounters:  03/26/22 188 lb 6.4 oz (85.5 kg)  03/10/22 192 lb (87.1 kg)  11/20/21 190 lb (86.2 kg)   She is on zoloft 50 mg daily for anxiety and on bisoprolol for BP and palpitations, has seen Dr. Fidela Juneau, had echo with LVH, atrial septal aneursym. Holter in 2016 showed PVCs without tachyarrhythmia.  She has noticed increase in SOB during exercise, running on the treadmill.  She will have to stop and rest to catch her breath during her 45 min run. Has felt more fatigued and a stronger sensation of palpitations/fluttering.  She reports BP is well controlled, she has been taking bisoprolol 20 mg for BP and palpitations, today their BP is BP: (!) 140/88.    She is not on cholesterol medication (is on fiber supplement). Her cholesterol is not at goal. She has been working on diet for cholesterol. The cholesterol last  visit was:   Lab Results  Component Value Date   CHOL 194 03/26/2022   HDL 61 03/26/2022   LDLCALC 108 (H) 03/26/2022   TRIG 137 03/26/2022   CHOLHDL 3.2 03/26/2022    Last A1C in the office was:   Lab Results  Component Value Date   HGBA1C 5.3 03/26/2022   Last GFR: Lab Results  Component Value Date   GFRNONAA >60 11/04/2021   Patient is on Vitamin D supplement, taking 1000 IU daily. Lab Results  Component Value Date   VD25OH 66 03/26/2022     B12 is controlled. Lab Results  Component Value Date   [id number] 12/19/2019    Current Medications:  Current Outpatient Medications on File Prior to Visit  Medication Sig Dispense Refill   aspirin EC 81 MG tablet Take 81 mg by mouth at bedtime. Swallow whole.     ciclopirox (LOPROX) 0.77 % cream Apply topically 2 (two) times daily. 15 g 0   Magnesium 500 MG TABS Take by mouth daily.     Methylcobalamin (B12 SL) Place under the tongue daily.     metoprolol succinate (TOPROL XL) 25 MG 24 hr tablet Take 2 tab twice a day for palpitations and blood pressure. 360 tablet 1   naftifine (NAFTIN) 1 % cream Apply topically to the right foot in the morning and at bedtime x4wk 90 g 0   norethindrone-ethinyl estradiol (FEMHRT 1/5) 1-5 MG-MCG TABS tablet Take 1  tablet by mouth at bedtime.     Omega-3 Fatty Acids (FISH OIL) 1000 MG CPDR Take by mouth daily.     OVER THE COUNTER MEDICATION Take 5,000 mcg by mouth 2 (two) times a day. DHT blocker     sertraline (ZOLOFT) 100 MG tablet Take 50 mg by mouth daily with lunch.     TURMERIC PO Take by mouth daily.     Vitamin D, Cholecalciferol, 1000 UNITS CAPS Take by mouth at bedtime.     verapamil (CALAN-SR) 120 MG CR tablet TAKE 1 TABLET BY MOUTH EVERYDAY AT BEDTIME FOR PALPITATIONS. (Patient not taking: Reported on 03/10/2022) 90 tablet 1   No current facility-administered medications on file prior to visit.   Allergies:  Allergies  Allergen Reactions   Latex Itching   Medical History:   She has Hypertension; Depression; Anxiety; Hyperlipidemia; Atrial septal aneurysm; Medication management; Vitamin D deficiency; Obesity (BMI 30.0-34.9); Pelvic prolapse with cystocele; Intermittent palpitations; History of adenomatous polyp of colon; B12 deficiency; Female pattern baldness; and Symptomatic PVCs on their problem list.   Health Maintenance:    Immunization History  Administered Date(s) Administered   Influenza-Unspecified 04/20/2015, 04/23/2017, 04/16/2018   Janssen (J&J) SARS-COV-2 Vaccination 10/08/2019   Moderna SARS-COV2 Booster Vaccination 06/02/2020   Pneumococcal Polysaccharide-23 04/20/2015   Tdap 04/20/2015   Zoster Recombinat (Shingrix) 04/23/2017, 08/28/2017    Tetanus: 2016  Influenza: get in Oct, gets annually by work Pneumovax: 2016 Shingrix: 2019 2/2 Covid 19: J&J, 09/2019, had moderna booster 05/2018  LMP:  Postmenopausal - Currently on [facility] Follows with GYN- had bladder prolaspe in 10/2021  Pap: 05/2018, PAP q3y, has pelvic annually with Dr. Philis Pique, will get PAP next year MGM: 11/2021 DEXA: N/A  Colonoscopy: 12/03/2020, Dr. Tarri Glenn, polyps, 6 month recall, planning in Nov 2022 - Recall 3 years. Due 06/07/2024 EGD: N/A  Last Dental Exam: Dr. Trenton Gammon, last 2023, goes q27mLast Eye Exam: wears glasses, [facility], last exam 07/2019, going every other year  Patient Care Team: CLiane Comber NP as PCP - General (Nurse Practitioner) HBobbye Charleston MD as Consulting Physician (Obstetrics and Gynecology) BThornton Park MD as Consulting Physician (Gastroenterology)  Surgical History:  She has a past surgical history that includes Colonoscopy with propofol (06/07/2021); Nasal septum surgery (1986); Bladder suspension (N/A, 11/06/2021); Cystocele repair (N/A, 11/06/2021); and Cystoscopy (N/A, 11/06/2021). Family History:  Herfamily history includes Arthritis in her maternal grandfather and mother; Cancer in her maternal grandmother; Congestive  Heart Failure in her maternal grandmother; Heart attack in her maternal grandmother; Hyperlipidemia in her father; Osteoporosis in her maternal grandmother and paternal grandmother; Vascular Disease in her maternal grandfather. Social History:  She reports that she has never smoked. She has never used smokeless tobacco. She reports current alcohol use of about 6.0 standard drinks of alcohol per week. She reports that she does not use drugs.  Review of Systems: Review of Systems  Constitutional:  Negative for malaise/fatigue and weight loss.  HENT:  Negative for hearing loss and tinnitus.   Eyes:  Negative for blurred vision and double vision.  Respiratory:  Negative for cough, sputum production, shortness of breath and wheezing.   Cardiovascular:  Positive for palpitations (increased with lack of sleep). Negative for chest pain, orthopnea, claudication, leg swelling and PND.  Gastrointestinal:  Negative for abdominal pain, blood in stool, constipation, diarrhea, heartburn, melena, nausea and vomiting.  Genitourinary: Negative.   Musculoskeletal:  Negative for falls, joint pain and myalgias.  Skin:  Negative for rash.  Neurological:  Negative for dizziness, tingling, sensory change, weakness and headaches.  Endo/Heme/Allergies:  Negative for polydipsia.  Psychiatric/Behavioral: Negative.  Negative for depression, memory loss, substance abuse and suicidal ideas. The patient is not nervous/anxious and does not have insomnia.   All other systems reviewed and are negative.   Physical Exam: Estimated body mass index is 29.07 kg/m as calculated from the following:   Height as of this encounter: 5' 7.5" (1.715 m).   Weight as of this encounter: 188 lb 6.4 oz (85.5 kg). BP (!) 140/88   Pulse 100   Temp 97.9 F (36.6 C)   Resp 17   Ht 5' 7.5" (1.715 m)   Wt 188 lb 6.4 oz (85.5 kg)   LMP  (LMP Unknown)   SpO2 98%   BMI 29.07 kg/m  General Appearance: Well nourished, in no apparent distress.   Eyes: PERRLA, EOMs, conjunctiva no swelling or erythema Sinuses: No Frontal/maxillary tenderness  ENT/Mouth: Ext aud canals clear, normal light reflex with TMs without erythema, bulging. Good dentition. No erythema, swelling, or exudate on post pharynx. Tonsils not swollen or erythematous. Hearing normal.  Neck: Supple, thyroid normal. No bruits  Respiratory: Respiratory effort normal, BS equal bilaterally without rales, rhonchi, wheezing or stridor.  Cardio: Irregularly irregular rhythm, normal S1/S2 without murmurs, rubs or gallops. No BLE. Chest: symmetric, with normal excursions and percussion.  Breasts: Defer to GYN Abdomen: Soft, nontender, no guarding, rebound, hernias, masses, or organomegaly.  Lymphatics: Non tender without lymphadenopathy.  Genitourinary: Defer to GYN Musculoskeletal: Full ROM all peripheral extremities,5/5 strength, and normal gait.  Skin: Warm, dry without rashes; she has brown scaly mole to R shin, approx 6 mm, regular border; scalp with scaly/scabbed erythematous lesion; hair thinning in female baldness pattern; localized erythema to nose and chin without pustular lesions.  Neuro: Cranial nerves intact, reflexes equal bilaterally. Normal muscle tone, no cerebellar symptoms. Sensation intact.  Psych: Awake and oriented X 3, normal affect, Insight and Judgment appropriate.   EKG:  New Onset Atrial fibrillation.  Samantha Terrell 5:37 PM [facility]

## 2022-03-26 ENCOUNTER — Encounter: Payer: Self-pay | Admitting: Nurse Practitioner

## 2022-03-26 ENCOUNTER — Ambulatory Visit (INDEPENDENT_AMBULATORY_CARE_PROVIDER_SITE_OTHER): Payer: 59 | Admitting: Nurse Practitioner

## 2022-03-26 VITALS — BP 140/88 | HR 100 | Temp 97.9°F | Resp 17 | Ht 67.5 in | Wt 188.4 lb

## 2022-03-26 DIAGNOSIS — Z136 Encounter for screening for cardiovascular disorders: Secondary | ICD-10-CM

## 2022-03-26 DIAGNOSIS — I253 Aneurysm of heart: Secondary | ICD-10-CM

## 2022-03-26 DIAGNOSIS — E559 Vitamin D deficiency, unspecified: Secondary | ICD-10-CM

## 2022-03-26 DIAGNOSIS — L658 Other specified nonscarring hair loss: Secondary | ICD-10-CM

## 2022-03-26 DIAGNOSIS — Z131 Encounter for screening for diabetes mellitus: Secondary | ICD-10-CM

## 2022-03-26 DIAGNOSIS — I493 Ventricular premature depolarization: Secondary | ICD-10-CM | POA: Diagnosis not present

## 2022-03-26 DIAGNOSIS — R239 Unspecified skin changes: Secondary | ICD-10-CM

## 2022-03-26 DIAGNOSIS — Z1389 Encounter for screening for other disorder: Secondary | ICD-10-CM

## 2022-03-26 DIAGNOSIS — Z79899 Other long term (current) drug therapy: Secondary | ICD-10-CM

## 2022-03-26 DIAGNOSIS — F3342 Major depressive disorder, recurrent, in full remission: Secondary | ICD-10-CM

## 2022-03-26 DIAGNOSIS — Z0001 Encounter for general adult medical examination with abnormal findings: Secondary | ICD-10-CM

## 2022-03-26 DIAGNOSIS — Z1329 Encounter for screening for other suspected endocrine disorder: Secondary | ICD-10-CM

## 2022-03-26 DIAGNOSIS — I4891 Unspecified atrial fibrillation: Secondary | ICD-10-CM

## 2022-03-26 DIAGNOSIS — I10 Essential (primary) hypertension: Secondary | ICD-10-CM | POA: Diagnosis not present

## 2022-03-26 DIAGNOSIS — Z0000 Encounter for general adult medical examination without abnormal findings: Secondary | ICD-10-CM

## 2022-03-26 DIAGNOSIS — N8189 Other female genital prolapse: Secondary | ICD-10-CM

## 2022-03-26 DIAGNOSIS — E785 Hyperlipidemia, unspecified: Secondary | ICD-10-CM

## 2022-03-26 DIAGNOSIS — E669 Obesity, unspecified: Secondary | ICD-10-CM

## 2022-03-26 NOTE — Patient Instructions (Signed)

## 2022-03-27 LAB — COMPLETE METABOLIC PANEL WITH GFR
AG Ratio: 1.8 (calc) (ref 1.0–2.5)
ALT: 19 U/L (ref 6–29)
AST: 20 U/L (ref 10–35)
Albumin: 4.7 g/dL (ref 3.6–5.1)
Alkaline phosphatase (APISO): 72 U/L (ref 37–153)
BUN: 11 mg/dL (ref 7–25)
CO2: 25 mmol/L (ref 20–32)
Calcium: 9.9 mg/dL (ref 8.6–10.4)
Chloride: 106 mmol/L (ref 98–110)
Creat: 0.82 mg/dL (ref 0.50–1.03)
Globulin: 2.6 g/dL (calc) (ref 1.9–3.7)
Glucose, Bld: 89 mg/dL (ref 65–99)
Potassium: 4.7 mmol/L (ref 3.5–5.3)
Sodium: 141 mmol/L (ref 135–146)
Total Bilirubin: 0.9 mg/dL (ref 0.2–1.2)
Total Protein: 7.3 g/dL (ref 6.1–8.1)
eGFR: 82 mL/min/{1.73_m2} (ref >=60)

## 2022-03-27 LAB — CBC WITH DIFFERENTIAL/PLATELET
Absolute Monocytes: 794 cells/uL (ref 200–950)
Basophils Absolute: 41 cells/uL (ref 0–200)
Basophils Relative: 0.5 %
Eosinophils Absolute: 73 cells/uL (ref 15–500)
Eosinophils Relative: 0.9 %
HCT: 44.8 % (ref 35.0–45.0)
Hemoglobin: 15 g/dL (ref 11.7–15.5)
Lymphs Abs: 2697 cells/uL (ref 850–3900)
MCH: 30.9 pg (ref 27.0–33.0)
MCHC: 33.5 g/dL (ref 32.0–36.0)
MCV: 92.2 fL (ref 80.0–100.0)
MPV: 9.7 fL (ref 7.5–12.5)
Monocytes Relative: 9.8 %
Neutro Abs: 4496 cells/uL (ref 1500–7800)
Neutrophils Relative %: 55.5 %
Platelets: 277 10*3/uL (ref 140–400)
RBC: 4.86 10*6/uL (ref 3.80–5.10)
RDW: 12.1 % (ref 11.0–15.0)
Total Lymphocyte: 33.3 %
WBC: 8.1 10*3/uL (ref 3.8–10.8)

## 2022-03-27 LAB — URINALYSIS, ROUTINE W REFLEX MICROSCOPIC
Bilirubin Urine: NEGATIVE
Glucose, UA: NEGATIVE
Hgb urine dipstick: NEGATIVE
Hyaline Cast: NONE SEEN /LPF
Ketones, ur: NEGATIVE
Nitrite: NEGATIVE
Protein, ur: NEGATIVE
RBC / HPF: NONE SEEN /HPF (ref 0–2)
Specific Gravity, Urine: 1.007 (ref 1.001–1.035)
WBC, UA: >=60 /HPF — AB (ref 0–5)
pH: 5.5 (ref 5.0–8.0)

## 2022-03-27 LAB — HEMOGLOBIN A1C
Hgb A1c MFr Bld: 5.3 % of total Hgb (ref <5.7)
Mean Plasma Glucose: 105 mg/dL
eAG (mmol/L): 5.8 mmol/L

## 2022-03-27 LAB — LIPID PANEL
Cholesterol: 194 mg/dL (ref <200)
HDL: 61 mg/dL (ref >=50)
LDL Cholesterol (Calc): 108 mg/dL (calc) — ABNORMAL HIGH
Non-HDL Cholesterol (Calc): 133 mg/dL (calc) — ABNORMAL HIGH (ref <130)
Total CHOL/HDL Ratio: 3.2 (calc) (ref <5.0)
Triglycerides: 137 mg/dL (ref <150)

## 2022-03-27 LAB — VITAMIN D 25 HYDROXY (VIT D DEFICIENCY, FRACTURES): Vit D, 25-Hydroxy: 66 ng/mL (ref 30–100)

## 2022-03-27 LAB — TSH: TSH: 1.47 mIU/L (ref 0.40–4.50)

## 2022-03-27 LAB — MICROSCOPIC MESSAGE

## 2022-03-27 LAB — MAGNESIUM: Magnesium: 1.9 mg/dL (ref 1.5–2.5)

## 2022-04-02 ENCOUNTER — Encounter: Payer: Self-pay | Admitting: Nurse Practitioner

## 2022-04-04 ENCOUNTER — Encounter: Payer: Self-pay | Admitting: Nurse Practitioner

## 2022-04-07 ENCOUNTER — Other Ambulatory Visit: Payer: Self-pay | Admitting: Nurse Practitioner

## 2022-04-07 MED ORDER — APIXABAN 2.5 MG PO TABS: 5.0000 mg | ORAL_TABLET | Freq: Two times a day (BID) | ORAL | 2 refills | Status: DC

## 2022-04-08 ENCOUNTER — Encounter: Payer: Self-pay | Admitting: Nurse Practitioner

## 2022-04-08 MED ORDER — APIXABAN 5 MG PO TABS: 5.0000 mg | ORAL_TABLET | Freq: Two times a day (BID) | ORAL | 0 refills | Status: DC

## 2022-04-18 ENCOUNTER — Encounter: Payer: Self-pay | Admitting: Nurse Practitioner

## 2022-04-25 ENCOUNTER — Other Ambulatory Visit: Payer: Self-pay | Admitting: Nurse Practitioner

## 2022-04-30 ENCOUNTER — Ambulatory Visit: Payer: 59 | Attending: Cardiology | Admitting: Cardiology

## 2022-04-30 ENCOUNTER — Ambulatory Visit (INDEPENDENT_AMBULATORY_CARE_PROVIDER_SITE_OTHER): Payer: 59

## 2022-04-30 ENCOUNTER — Encounter: Payer: Self-pay | Admitting: Cardiology

## 2022-04-30 VITALS — BP 152/88 | HR 67 | Ht 67.5 in | Wt 191.0 lb

## 2022-04-30 DIAGNOSIS — I10 Essential (primary) hypertension: Secondary | ICD-10-CM

## 2022-04-30 DIAGNOSIS — I480 Paroxysmal atrial fibrillation: Secondary | ICD-10-CM

## 2022-04-30 DIAGNOSIS — E785 Hyperlipidemia, unspecified: Secondary | ICD-10-CM | POA: Diagnosis not present

## 2022-04-30 DIAGNOSIS — R0602 Shortness of breath: Secondary | ICD-10-CM

## 2022-04-30 DIAGNOSIS — R0683 Snoring: Secondary | ICD-10-CM

## 2022-04-30 DIAGNOSIS — R400 Somnolence: Secondary | ICD-10-CM

## 2022-04-30 MED ORDER — METOPROLOL TARTRATE 100 MG PO TABS: ORAL_TABLET | ORAL | 0 refills | Status: DC

## 2022-04-30 NOTE — Progress Notes (Signed)
Cardiology Office Note:    Date:  04/30/2022   ID:  Samantha Terrell, DOB [birth year], MRN [id number]  PCP:  Liane Comber, NP  Cardiologist:  None  Electrophysiologist:  None   Referring MD: Darrol Jump, NP   Chief Complaint  Patient presents with   Atrial Fibrillation    History of Present Illness:    Samantha Terrell is a 61 y.o. female with a hx of hyperlipidemia who is referred by Darrol Jump, NP for evaluation of atrial fibrillation.  She was found at recent visit to be in A-fib.  Reports has been having palpitations.  Can happen daily and last up to 30 minutes.  Reports heart beat feels irregular and sometimes racing.  She has been taking Eliquis, denies any bleeding issues.  Does not report any chest pain but states she has been having shortness of breath when she tries to exercise on treadmill recently.  Happens after 10 to 15 minutes on the treadmill.  Reports some lightheadedness but denies any syncope.  No lower extremity edema.  No smoking history.  Family history includes sister has mitral valve prolapse and maternal grandmother had CHF.   Past Medical History:  Diagnosis Date   Degenerative disc disease, cervical    Depression    Family history of adverse reaction to anesthesia    mother-- ponv   GAD (generalized anxiety disorder)    History of adenomatous polyp of colon    History of COVID-19 11/2020   per pt mild symptoms that resolved   Hypertension    PVC (premature ventricular contraction)    symptomatic intermittant palpitations  (event monitor in epic 12-03-2014 showed SR w/ PVCs)   SUI (stress urinary incontinence, female)     Past Surgical History:  Procedure Laterality Date   BLADDER SUSPENSION N/A 11/06/2021   Procedure: TRANSVAGINAL TAPE (TVT) PROCEDURE;  Surgeon: Bobbye Charleston, MD;  Location: [facility];  Service: Gynecology;  Laterality: N/A;   COLONOSCOPY WITH PROPOFOL  06/07/2021   by Dr.Beavers   CYSTOCELE REPAIR  N/A 11/06/2021   Procedure: ANTERIOR REPAIR (CYSTOCELE);  Surgeon: Bobbye Charleston, MD;  Location: [facility];  Service: Gynecology;  Laterality: N/A;   CYSTOSCOPY N/A 11/06/2021   Procedure: CYSTOSCOPY;  Surgeon: Bobbye Charleston, MD;  Location: [facility];  Service: Gynecology;  Laterality: N/A;   NASAL SEPTUM SURGERY  1986    Current Medications: Current Meds  Medication Sig   ELIQUIS 5 MG TABS tablet TAKE 1 TABLET BY MOUTH TWICE A DAY   Magnesium 500 MG TABS Take by mouth daily.   Methylcobalamin (B12 SL) Place under the tongue daily.   metoprolol succinate (TOPROL XL) 25 MG 24 hr tablet Take 2 tab twice a day for palpitations and blood pressure.   metoprolol tartrate (LOPRESSOR) 100 MG tablet Take 1 tablet ('100mg'$ ) TWO hours prior to CT scan   norethindrone-ethinyl estradiol (FEMHRT 1/5) 1-5 MG-MCG TABS tablet Take 1 tablet by mouth at bedtime.   Omega-3 Fatty Acids (FISH OIL) 1000 MG CPDR Take by mouth daily.   OVER THE COUNTER MEDICATION Take 5,000 mcg by mouth 2 (two) times a day. DHT blocker   sertraline (ZOLOFT) 100 MG tablet Take 50 mg by mouth daily with lunch.   TURMERIC PO Take by mouth daily.   verapamil (CALAN-SR) 120 MG CR tablet TAKE 1 TABLET BY MOUTH EVERYDAY AT BEDTIME FOR PALPITATIONS.   Vitamin D, Cholecalciferol, 1000 UNITS CAPS Take by mouth at bedtime.  Allergies:   Latex   Social History   Socioeconomic History   Marital status: Married    Spouse name: Not on file   Number of children: Not on file   Years of education: Not on file   Highest education level: Not on file  Occupational History   Not on file  Tobacco Use   Smoking status: Never   Smokeless tobacco: Never  Vaping Use   Vaping Use: Never used  Substance and Sexual Activity   Alcohol use: Yes    Alcohol/week: 6.0 standard drinks of alcohol    Types: 6 Glasses of wine per week   Drug use: Never   Sexual activity: Yes    Partners: Male    Birth  control/protection: Post-menopausal  Other Topics Concern   Not on file  Social History Narrative   Not on file   Social Determinants of Health   Financial Resource Strain: Not on file  Food Insecurity: Not on file  Transportation Needs: Not on file  Physical Activity: Sufficiently Active (12/03/2017)   Exercise Vital Sign    Days of Exercise per Week: 4 days    Minutes of Exercise per Session: 50 min  Stress: No Stress Concern Present (12/03/2017)   [place]    Feeling of Stress : Only a little  Social Connections: Not on file     Family History: The patient's family history includes Arthritis in her maternal grandfather and mother; Cancer in her maternal grandmother; Congestive Heart Failure in her maternal grandmother; Heart attack in her maternal grandmother; Hyperlipidemia in her father; Osteoporosis in her maternal grandmother and paternal grandmother; Vascular Disease in her maternal grandfather. There is no history of Colon cancer, Breast cancer, Colon polyps, Esophageal cancer, Rectal cancer, or Stomach cancer.  ROS:   Please see the history of present illness.     All other systems reviewed and are negative.  EKGs/Labs/Other Studies Reviewed:    The following studies were reviewed today:  EKG:   04/30/2022: Normal sinus rhythm, rate 67, nonspecific T wave flattening, less than 1 mm ST depressions in inferior leads  Recent Labs: 03/26/2022: ALT 19; BUN 11; Creat 0.82; Hemoglobin 15.0; Magnesium 1.9; Platelets 277; Potassium 4.7; Sodium 141; TSH 1.47  Recent Lipid Panel    Component Value Date/Time   CHOL 194 03/26/2022 1643   TRIG 137 03/26/2022 1643   HDL 61 03/26/2022 1643   CHOLHDL 3.2 03/26/2022 1643   VLDL 27 11/27/2016 1541   LDLCALC 108 (H) 03/26/2022 1643    Physical Exam:    VS:  BP (!) 152/88   Pulse 67   Ht 5' 7.5" (1.715 m)   Wt 191 lb (86.6 kg)   LMP  (LMP Unknown)   SpO2 98%    BMI 29.47 kg/m     Wt Readings from Last 3 Encounters:  04/30/22 191 lb (86.6 kg)  03/26/22 188 lb 6.4 oz (85.5 kg)  03/10/22 192 lb (87.1 kg)     GEN:  Well nourished, well developed in no acute distress HEENT: Normal NECK: No JVD; No carotid bruits LYMPHATICS: No lymphadenopathy CARDIAC: RRR, no murmurs, rubs, gallops RESPIRATORY:  Clear to auscultation without rales, wheezing or rhonchi  ABDOMEN: Soft, non-tender, non-distended MUSCULOSKELETAL:  No edema; No deformity  SKIN: Warm and dry NEUROLOGIC:  Alert and oriented x 3 PSYCHIATRIC:  Normal affect   ASSESSMENT:    1. Paroxysmal atrial fibrillation (HCC)   2. Shortness of breath  3. Essential hypertension   4. Hyperlipidemia, unspecified hyperlipidemia type   5. Snoring   6. Daytime somnolence    PLAN:    Atrial fibrillation: Diagnosed on EKG at recent PCP visit.  CHA2DS2-VASc score 2 (hypertension, female) -Continue Eliquis 5 mg twice daily -Continue Toprol-XL 50 mg twice daily -Check Zio patch x7 days to evaluate A-fib burden and ensure adequate rate control -Echocardiogram -Sleep study  DOE: Reports dyspnea with exertion, which could represent anginal equivalent.  Does have CAD risk factors (age, hypertension, hyperlipidemia).  Recommend coronary CTA to evaluate for obstructive CAD.  Will have her hold her home metoprolol and give Lopressor 100 mg prior to study  Hyperlipidemia: LDL 108 on 03/26/2022.  10-year ASCVD risk score 5%.  Follow-up results of coronary CTA to guide how aggressive to be in lowering cholesterol.  Hypertension: On Toprol-XL 50 mg twice daily.  BP elevated in clinic today.  Asked to check BP twice daily for next week and call with results.  Snoring/daytime somnolence: Check sleep study  RTC in 3 months   Medication Adjustments/Labs and Tests Ordered: Current medicines are reviewed at length with the patient today.  Concerns regarding medicines are outlined above.  Orders Placed This  Encounter  Procedures   CT CORONARY MORPH W/CTA COR W/SCORE W/CA W/CM &/OR WO/CM   Basic metabolic panel   LONG TERM MONITOR (3-14 DAYS)   EKG 12-Lead   ECHOCARDIOGRAM COMPLETE   [facility] Sleep Study   Meds ordered this encounter  Medications   metoprolol tartrate (LOPRESSOR) 100 MG tablet    Sig: Take 1 tablet ('100mg'$ ) TWO hours prior to CT scan    Dispense:  1 tablet    Refill:  0    Patient Instructions  Medication Instructions:  Your physician recommends that you continue on your current medications as directed. Please refer to the Current Medication list given to you today.  *If you need a refill on your cardiac medications before your next appointment, please call your pharmacy*   Lab Work: BMET today  If you have labs (blood work) drawn today and your tests are completely normal, you will receive your results only by: [place] (if you have MyChart) OR A paper copy in the mail If you have any lab test that is abnormal or we need to change your treatment, we will call you to review the results.   Testing/Procedures: Coronary CTA-see instructions below  Your physician has requested that you have an echocardiogram. Echocardiography is a painless test that uses sound waves to create images of your heart. It provides your doctor with information about the size and shape of your heart and how well your heart's chambers and valves are working. This procedure takes approximately one hour. There are no restrictions for this procedure.  WatchPAT?  Is a FDA cleared portable home sleep study test that uses a watch and 3 points of contact to monitor 7 different channels, including your heart rate, oxygen saturations, body position, snoring, and chest motion.  The study is easy to use from the comfort of your own home and accurately detect sleep apnea.  Before bed, you attach the chest sensor, attached the sleep apnea bracelet to your nondominant hand, and attach the finger probe.   After the study, the raw data is downloaded from the watch and scored for apnea events.   For more information: [email or web address]  Patient Testing Instructions:  Do not put battery into the device until bedtime when you are ready to begin  the test. Please call the support number if you need assistance after following the instructions below: 24 hour support line- [phone] or [facility] support at [phone] (option 2)  Download the [employer] American[employer]PAT One" app through the google play store or App Store  Be sure to turn on or enable access to bluetooth in [place] on your smartphone/ device  Make sure no other bluetooth devices are on and within the vicinity of your smartphone/ device and WatchPAT watch during testing.  Make sure to leave your smart phone/ device plugged in and charging all night.  When ready for bed:  Follow the instructions step by step in the WatchPAT One App to activate the testing device. For additional instructions, including video instruction, visit the WatchPAT One video on Youtube. You can search for [place] One within Youtube (video is 4 minutes and 18 seconds) or enter: https://youtube/watch?v=BCce_vbiwxE Please note: You will be prompted to enter a Pin to connect via bluetooth when starting the test. The PIN will be assigned to you when you receive the test.  The device is disposable, but it recommended that you retain the device until you receive a call letting you know the study has been received and the results have been interpreted.  We will let you know if the study did not transmit to [place] properly after the test is completed. You do not need to call us to confirm the receipt of the test.  Please complete the test within 48 hours of receiving PIN.   Frequently Asked Questions:  What is Watch [place] one?  A single use fully disposable home sleep apnea testing device and will not need to be returned after completion.  What are the requirements to  use WatchPAT one?  The be able to have a successful watchpat one sleep study, you should have your Watch pat one device, your smart phone, watch pat one app, your PIN number and Internet access What type of phone do I need?  You should have a smart phone that uses Android 5.1 and above or any Iphone with IOS 10 and above How can I download the WatchPAT one app?  Based on your device type search for WatchPAT one app either in google play for android devices or APP store for Iphone's Where will I get my PIN for the study?  Your PIN will be provided by your physician's office. It is used for authentication and if you lose/forget your PIN, please reach out to your providers office.  I do not have Internet at home. Can I do WatchPAT one study?  WatchPAT One needs Internet connection throughout the night to be able to transmit the sleep data. You can use your home/local internet or your cellular's data package. However, it is always recommended to use home/local Internet. It is estimated that between 20MB-30MB will be used with each study.However, the application will be looking for 80MB space in the phone to start the study.  What happens if I lose internet or bluetooth connection?  During the internet disconnection, your phone will not be able to transmit the sleep data. All the data, will be stored in your phone. As soon as the internet connection is back on, the phone will being sending the sleep data. During the bluetooth disconnection, WatchPAT one will not be able to to send the sleep data to your phone. Data will be kept in the [facility] one until two devices have bluetooth connection back on. As soon as the connection is back on,  WatchPAT one will send the sleep data to the phone.  How long do I need to wear the WatchPAT one?  After you start the study, you should wear the device at least 6 hours.  How far should I keep my phone from the device?  During the night, your phone should be within 15  feet.  What happens if I leave the room for restroom or other reasons?  Leaving the room for any reason will not cause any problem. As soon as your get back to the room, both devices will reconnect and will continue to send the sleep data. Can I use my phone during the sleep study?  Yes, you can use your phone as usual during the study. But it is recommended to put your watchpat one on when you are ready to go to bed.  How will I get my study results?  A soon as you completed your study, your sleep data will be sent to the provider. They will then share the results with you when they are ready.   Follow-Up: At [facility], you and your health needs are our priority.  As part of our continuing mission to provide you with exceptional heart care, we have created designated Provider Care Teams.  These Care Teams include your primary Cardiologist (physician) and Advanced Practice Providers (APPs -  Physician Assistants and Nurse Practitioners) who all work together to provide you with the care you need, when you need it.  We recommend signing up for the patient portal called "MyChart".  Sign up information is provided on this After Visit Summary.  MyChart is used to connect with patients for Virtual Visits (Telemedicine).  Patients are able to view lab/test results, encounter notes, upcoming appointments, etc.  Non-urgent messages can be sent to your provider as well.   To learn more about what you can do with MyChart, go to [email or web address].    Your next appointment:   3 month(s)  The format for your next appointment:   In Person  Provider:   Dr. Gardiner Rhyme Other Instructions  Please check your blood pressure at home twice daily, write it down.  Call the office or send message via Mychart with the readings in 1-2 weeks for Dr. Gardiner Rhyme to review.      Your cardiac CT will be scheduled at one of the below locations:   [facility] [address] [place],  [place] [address] [phone]  If scheduled at [facility], please arrive at the [facility] and [facility]'s Entrance (Entrance C2) of [facility] 30 minutes prior to test start time. You can use the FREE valet parking offered at entrance C (encouraged to control the heart rate for the test)  Proceed to the [facility] Radiology Department (first floor) to check-in and test prep.  All radiology patients and guests should use entrance C2 at [facility], accessed from [facility], even though the hospital's physical address listed is [address].      Please follow these instructions carefully (unless otherwise directed):  On the Night Before the Test: Be sure to Drink plenty of water. Do not consume any caffeinated/decaffeinated beverages or chocolate 12 hours prior to your test. Do not take any antihistamines 12 hours prior to your test.  On the Day of the Test: Drink plenty of water until 1 hour prior to the test. Do not eat any food 1 hour prior to test. You may take your regular  medications prior to the test.  Take metoprolol (Lopressor) two hours prior to test. Hold Toprol XL AM of CT FEMALES- please wear underwire-free bra if available, avoid dresses & tight clothing      After the Test: Drink plenty of water. After receiving IV contrast, you may experience a mild flushed feeling. This is normal. On occasion, you may experience a mild rash up to 24 hours after the test. This is not dangerous. If this occurs, you can take Benadryl 25 mg and increase your fluid intake. If you experience trouble breathing, this can be serious. If it is severe call 911 IMMEDIATELY. If it is mild, please call our office. If you take any of these medications: Glipizide/Metformin, Avandament, Glucavance, please do not take 48 hours after completing test unless otherwise instructed.  We will call to schedule your test 2-4 weeks out understanding that some insurance  companies will need an authorization prior to the service being performed.   For non-scheduling related questions, please contact the cardiac imaging nurse navigator should you have any questions/concerns: Marchia Bond, Cardiac Imaging Nurse Navigator Gordy Clement, Cardiac Imaging Nurse Navigator [facility] Direct Office Dial: [phone]   For scheduling needs, including cancellations and rescheduling, please call [place], [phone].           Signed, Donato Heinz, MD  04/30/2022 3:40 PM    [facility]

## 2022-04-30 NOTE — Patient Instructions (Addendum)
Medication Instructions:  Your physician recommends that you continue on your current medications as directed. Please refer to the Current Medication list given to you today.  *If you need a refill on your cardiac medications before your next appointment, please call your pharmacy*   Lab Work: BMET today  If you have labs (blood work) drawn today and your tests are completely normal, you will receive your results only by: [place] (if you have MyChart) OR A paper copy in the mail If you have any lab test that is abnormal or we need to change your treatment, we will call you to review the results.   Testing/Procedures: Coronary CTA-see instructions below  Your physician has requested that you have an echocardiogram. Echocardiography is a painless test that uses sound waves to create images of your heart. It provides your doctor with information about the size and shape of your heart and how well your heart's chambers and valves are working. This procedure takes approximately one hour. There are no restrictions for this procedure.  ZIO XT- Long Term Monitor Instructions   Your physician has requested you wear a ZIO patch monitor for _7_ days.  This is a single patch monitor.   [employer] supplies one patch monitor per enrollment. Additional stickers are not available. Please do not apply patch if you will be having a Nuclear Stress Test, Echocardiogram, Cardiac CT, MRI, or Chest Xray during the period you would be wearing the monitor. The patch cannot be worn during these tests. You cannot remove and re-apply the ZIO XT patch monitor.  Your ZIO patch monitor will be sent Fed Ex from [employer] directly to your home address. It may take 3-5 days to receive your monitor after you have been enrolled.  Once you have received your monitor, please review the enclosed instructions. Your monitor has already been registered assigning a specific monitor serial # to you.  Billing and  Patient Assistance Program Information   We have supplied [employer] with any of your insurance information on file for billing purposes. [employer] offers a sliding scale Patient Assistance Program for patients that do not have insurance, or whose insurance does not completely cover the cost of the ZIO monitor.   You must apply for the Patient Assistance Program to qualify for this discounted rate.     To apply, please call [employer] at [phone], select option 4, then select option 2, and ask to apply for Patient Assistance Program.  Theodore Demark will ask your household income, and how many people are in your household.  They will quote your out-of-pocket cost based on that information.  [employer] will also be able to set up a 35-month interest-free payment plan if needed.  Applying the monitor   Shave hair from upper left chest.  Hold abrader disc by orange tab. Rub abrader in 40 strokes over the upper left chest as indicated in your monitor instructions.  Clean area with 4 enclosed alcohol pads. Let dry.  Apply patch as indicated in monitor instructions. Patch will be placed under collarbone on left side of chest with arrow pointing upward.  Rub patch adhesive wings for 2 minutes. Remove white label marked "1". Remove the white label marked "2". Rub patch adhesive wings for 2 additional minutes.  While looking in a mirror, press and release button in center of patch. A small green light will flash 3-4 times. This will be your only indicator that the monitor has been turned on. ?  Do not shower for  the first 24 hours. You may shower after the first 24 hours.  Press the button if you feel a symptom. You will hear a small click. Record Date, Time and Symptom in the Patient Logbook.  When you are ready to remove the patch, follow instructions on the last 2 pages of the Patient Logbook. Stick patch monitor onto the last page of Patient Logbook.  Place Patient Logbook in the blue and white box.  Use locking tab  on box and tape box closed securely.  The blue and white box has prepaid postage on it. Please place it in the mailbox as soon as possible. Your physician should have your test results approximately 7 days after the monitor has been mailed back to [facility]  Call [place] at [phone] if you have questions regarding your ZIO XT patch monitor. Call them immediately if you see an orange light blinking on your monitor.  If your monitor falls off in less than 4 days, contact our Monitor department at [phone]. ?If your monitor becomes loose or falls off after 4 days call [employer] at [phone] for suggestions on securing your monitor.?  WatchPAT?  Is a FDA cleared portable home sleep study test that uses a watch and 3 points of contact to monitor 7 different channels, including your heart rate, oxygen saturations, body position, snoring, and chest motion.  The study is easy to use from the comfort of your own home and accurately detect sleep apnea.  Before bed, you attach the chest sensor, attached the sleep apnea bracelet to your nondominant hand, and attach the finger probe.  After the study, the raw data is downloaded from the watch and scored for apnea events.   For more information: [email or web address]  Patient Testing Instructions:  Do not put battery into the device until bedtime when you are ready to begin the test. Please call the support number if you need assistance after following the instructions below: 24 hour support line- [phone] or ITAMAR support at [phone] (option 2)  Download the [employer] AmericanWatchPAT One" app through the google play store or App Store  Be sure to turn on or enable access to bluetooth in settlings on your smartphone/ device  Make sure no other bluetooth devices are on and within the vicinity of your smartphone/ device and WatchPAT watch during testing.  Make sure to leave your smart phone/ device plugged in  and charging all night.  When ready for bed:  Follow the instructions step by step in the WatchPAT One App to activate the testing device. For additional instructions, including video instruction, visit the WatchPAT One video on Youtube. You can search for [place] One within Youtube (video is 4 minutes and 18 seconds) or enter: https://youtube/watch?v=BCce_vbiwxE Please note: You will be prompted to enter a Pin to connect via bluetooth when starting the test. The PIN will be assigned to you when you receive the test.  The device is disposable, but it recommended that you retain the device until you receive a call letting you know the study has been received and the results have been interpreted.  We will let you know if the study did not transmit to [place] properly after the test is completed. You do not need to call us to confirm the receipt of the test.  Please complete the test within 48 hours of receiving PIN.   Frequently Asked Questions:  What is Watch [place] one?  A single use fully disposable home sleep apnea testing device and  will not need to be returned after completion.  What are the requirements to use WatchPAT one?  The be able to have a successful watchpat one sleep study, you should have your Watch pat one device, your smart phone, watch pat one app, your PIN number and Internet access What type of phone do I need?  You should have a smart phone that uses Android 5.1 and above or any Iphone with IOS 10 and above How can I download the WatchPAT one app?  Based on your device type search for WatchPAT one app either in google play for android devices or APP store for Iphone's Where will I get my PIN for the study?  Your PIN will be provided by your physician's office. It is used for authentication and if you lose/forget your PIN, please reach out to your providers office.  I do not have Internet at home. Can I do WatchPAT one study?  WatchPAT One needs Internet connection throughout the  night to be able to transmit the sleep data. You can use your home/local internet or your cellular's data package. However, it is always recommended to use home/local Internet. It is estimated that between 20MB-30MB will be used with each study.However, the application will be looking for 80MB space in the phone to start the study.  What happens if I lose internet or bluetooth connection?  During the internet disconnection, your phone will not be able to transmit the sleep data. All the data, will be stored in your phone. As soon as the internet connection is back on, the phone will being sending the sleep data. During the bluetooth disconnection, WatchPAT one will not be able to to send the sleep data to your phone. Data will be kept in the [facility] one until two devices have bluetooth connection back on. As soon as the connection is back on, WatchPAT one will send the sleep data to the phone.  How long do I need to wear the WatchPAT one?  After you start the study, you should wear the device at least 6 hours.  How far should I keep my phone from the device?  During the night, your phone should be within 15 feet.  What happens if I leave the room for restroom or other reasons?  Leaving the room for any reason will not cause any problem. As soon as your get back to the room, both devices will reconnect and will continue to send the sleep data. Can I use my phone during the sleep study?  Yes, you can use your phone as usual during the study. But it is recommended to put your watchpat one on when you are ready to go to bed.  How will I get my study results?  A soon as you completed your study, your sleep data will be sent to the provider. They will then share the results with you when they are ready.   Follow-Up: At [facility], you and your health needs are our priority.  As part of our continuing mission to provide you with exceptional heart care, we have created designated Provider Care  Teams.  These Care Teams include your primary Cardiologist (physician) and Advanced Practice Providers (APPs -  Physician Assistants and Nurse Practitioners) who all work together to provide you with the care you need, when you need it.  We recommend signing up for the patient portal called "MyChart".  Sign up information is provided on this After Visit Summary.  MyChart is used  to connect with patients for Virtual Visits (Telemedicine).  Patients are able to view lab/test results, encounter notes, upcoming appointments, etc.  Non-urgent messages can be sent to your provider as well.   To learn more about what you can do with MyChart, go to [email or web address].    Your next appointment:   3 month(s)  The format for your next appointment:   In Person  Provider:   Dr. Gardiner Rhyme Other Instructions  Please check your blood pressure at home twice daily, write it down.  Call the office or send message via Mychart with the readings in 1-2 weeks for Dr. Gardiner Rhyme to review.      Your cardiac CT will be scheduled at one of the below locations:   [facility] [address] [place], [place] [address] [phone]  If scheduled at [facility], please arrive at the [facility] Children[facility] Entrance (Entrance C2) of [facility] 30 minutes prior to test start time. You can use the FREE valet parking offered at entrance C (encouraged to control the heart rate for the test)  Proceed to the [facility] Radiology Department (first floor) to check-in and test prep.  All radiology patients and guests should use entrance C2 at [facility], accessed from [facility], even though the hospital's physical address listed is [address]      Please follow these instructions carefully (unless otherwise directed):  On the Night Before the Test: Be sure to Drink plenty of water. Do not consume any caffeinated/decaffeinated beverages or chocolate  12 hours prior to your test. Do not take any antihistamines 12 hours prior to your test.  On the Day of the Test: Drink plenty of water until 1 hour prior to the test. Do not eat any food 1 hour prior to test. You may take your regular medications prior to the test.  Take metoprolol (Lopressor) two hours prior to test. Hold Toprol XL AM of CT FEMALES- please wear underwire-free bra if available, avoid dresses & tight clothing      After the Test: Drink plenty of water. After receiving IV contrast, you may experience a mild flushed feeling. This is normal. On occasion, you may experience a mild rash up to 24 hours after the test. This is not dangerous. If this occurs, you can take Benadryl 25 mg and increase your fluid intake. If you experience trouble breathing, this can be serious. If it is severe call 911 IMMEDIATELY. If it is mild, please call our office. If you take any of these medications: Glipizide/Metformin, Avandament, Glucavance, please do not take 48 hours after completing test unless otherwise instructed.  We will call to schedule your test 2-4 weeks out understanding that some insurance companies will need an authorization prior to the service being performed.   For non-scheduling related questions, please contact the cardiac imaging nurse navigator should you have any questions/concerns: Marchia Bond, Cardiac Imaging Nurse Navigator Gordy Clement, Cardiac Imaging Nurse Navigator [facility] Direct Office Dial: [phone]   For scheduling needs, including cancellations and rescheduling, please call [place], [phone].

## 2022-04-30 NOTE — Progress Notes (Unsigned)
Enrolled for [employer] to mail a ZIO XT long term holter monitor to the patients address on file.  

## 2022-05-01 ENCOUNTER — Telehealth: Payer: Self-pay | Admitting: *Deleted

## 2022-05-01 ENCOUNTER — Telehealth: Payer: Self-pay

## 2022-05-01 LAB — BASIC METABOLIC PANEL
BUN/Creatinine Ratio: 14 (ref 9–23)
BUN: 10 mg/dL (ref 6–24)
CO2: 20 mmol/L (ref 20–29)
Calcium: 9.3 mg/dL (ref 8.7–10.2)
Chloride: 104 mmol/L (ref 96–106)
Creatinine, Ser: 0.71 mg/dL (ref 0.57–1.00)
Glucose: 113 mg/dL — ABNORMAL HIGH (ref 70–99)
Potassium: 3.8 mmol/L (ref 3.5–5.2)
Sodium: 139 mmol/L (ref 134–144)
eGFR: 98 mL/min/{1.73_m2} (ref >59)

## 2022-05-01 NOTE — Telephone Encounter (Signed)
Staff message sent to Samantha Terrell ok to activate itamar device.  

## 2022-05-01 NOTE — Telephone Encounter (Signed)
Called and made the patient aware that She  may proceed with the [facility] Sleep Study. PIN # provided to the patient. Patient made aware that She will be contacted after the test has been read with the results and any recommendations. Patient verbalized understanding and thanked me for the call.

## 2022-05-02 ENCOUNTER — Encounter (HOSPITAL_BASED_OUTPATIENT_CLINIC_OR_DEPARTMENT_OTHER): Payer: 59 | Admitting: Cardiology

## 2022-05-02 DIAGNOSIS — G4733 Obstructive sleep apnea (adult) (pediatric): Secondary | ICD-10-CM

## 2022-05-04 DIAGNOSIS — I480 Paroxysmal atrial fibrillation: Secondary | ICD-10-CM | POA: Diagnosis not present

## 2022-05-05 ENCOUNTER — Ambulatory Visit: Payer: 59 | Attending: Cardiology

## 2022-05-05 ENCOUNTER — Ambulatory Visit (HOSPITAL_COMMUNITY): Payer: 59 | Attending: Cardiology

## 2022-05-05 DIAGNOSIS — I480 Paroxysmal atrial fibrillation: Secondary | ICD-10-CM | POA: Insufficient documentation

## 2022-05-05 LAB — ECHOCARDIOGRAM COMPLETE
Area-P 1/2: 4.32 cm2
P 1/2 time: 252 msec
S' Lateral: 2.7 cm

## 2022-05-05 NOTE — Procedures (Signed)
SLEEP STUDY REPORT Patient Information Study Date: 05/02/22 Patient Name: Samantha Terrell Patient ID: [id number] Birth Date: 04-21-2061 Age: 61 Gender: Female BMI: 30.1 (W=192 lb, H=5' 7'') Referring Physician: Oswaldo Milian, MD  TEST DESCRIPTION:  Home sleep apnea testing was completed using the WatchPat, a Type 1 device, utilizing peripheral arterial tonometry (PAT), chest movement, actigraphy, pulse oximetry, pulse rate, body position and snore.  AHI was calculated with apnea and hypopnea using valid sleep time as the denominator. RDI includes apneas, hypopneas, and RERAs.  The data acquired and the scoring of sleep and all associated events were performed in accordance with the recommended standards and specifications as outlined in the AASM Manual for the Scoring of Sleep and Associated Events 2.2.0 (2015).  FINDINGS:  1.  Moderate Obstructive Sleep Apnea with AHI 27.3/hr.   2.  No Central Sleep Apnea with pAHIc 2.3/hr.  3.  Oxygen desaturations as low as 84%.  4.  Severe snoring was present. O2 sats were < 88% for 0.9 min.  5.  Total sleep time was 3 hrs and 24 min.  6.  23.6% of total sleep time was spent in REM sleep.   7.  Prolonged sleep onset latency at 24 min  8.  Shortened REM sleep onset latency at 32mn.   9.  Total awakenings were 3.  10. Arrhythmia detection:  Suggestive of possible brief atrial fibrillation lasting 9 hours 14 min and 58 secs.  This is not diagnostic and further testing with outpatient telemetry monitoring is recommended.   DIAGNOSIS:   Moderate Obstructive Sleep Apnea (G47.33) Possible atrial arrhythmias.  RECOMMENDATIONS:   1.  Clinical correlation of these findings is necessary.  The decision to treat obstructive sleep apnea (OSA) is usually based on the presence of apnea symptoms or the presence of associated medical conditions such as Hypertension, Congestive Heart Failure, Atrial Fibrillation or Obesity.  The most common symptoms of  OSA are snoring, gasping for breath while sleeping, daytime sleepiness and fatigue.   2.  Initiating apnea therapy is recommended given the presence of symptoms and/or associated conditions. Recommend proceeding with one of the following:     a.  Auto-CPAP therapy with a pressure range of 5-20cm H2O.     b.  An oral appliance (OA) that can be obtained from certain dentists with expertise in sleep medicine.  These are primarily of use in non-obese patients with mild and moderate disease.     c.  An ENT consultation which may be useful to look for specific causes of obstruction and possible treatment options.     d.  If patient is intolerant to PAP therapy, consider referral to ENT for evaluation for hypoglossal nerve stimulator.   3.  Close follow-up is necessary to ensure success with CPAP or oral appliance therapy for maximum benefit.  4.  A follow-up oximetry study on CPAP is recommended to assess the adequacy of therapy and determine the need for supplemental oxygen or the potential need for Bi-level therapy.  An arterial blood gas to determine the adequacy of baseline ventilation and oxygenation should also be considered.  5.  Healthy sleep recommendations include:  adequate nightly sleep (normal 7-9 hrs/night), avoidance of caffeine after noon and alcohol near bedtime, and maintaining a sleep environment that is cool, dark and quiet.  6.  Weight loss for overweight patients is recommended.  Even modest amounts of weight loss can significantly improve the severity of sleep apnea.  7.  Snoring recommendations include:  weight loss where appropriate, side sleeping, and avoidance of alcohol before bed.  8.  Operation of motor vehicle should not be performed when sleepy.  9.  Consider outpatient heart monitor if patient does not have a diagnosis of atrial fibrillation.  Signature:   Fransico Him, MD; [facility]; [place], [place] Board of Sleep Medicine Electronically Signed: 05/05/22 Page  2 of 5

## 2022-05-06 ENCOUNTER — Other Ambulatory Visit: Payer: Self-pay | Admitting: Nurse Practitioner

## 2022-05-13 ENCOUNTER — Telehealth: Payer: Self-pay | Admitting: *Deleted

## 2022-05-13 NOTE — Telephone Encounter (Signed)
-----   Message from Sueanne Margarita, MD sent at 05/05/2022 11:51 AM EDT ----- Please let patient know that they have sleep apnea.  Recommend therapeutic CPAP titration for treatment of patient's sleep disordered breathing.  If unable to perform an in lab titration then initiate ResMed auto CPAP from 4 to 15cm H2O with heated humidity and mask of choice and overnight pulse ox on CPAP.

## 2022-05-13 NOTE — Telephone Encounter (Signed)
Left message to return a call to discuss sleep study results and recommendations. 

## 2022-05-14 ENCOUNTER — Telehealth (HOSPITAL_COMMUNITY): Payer: Self-pay | Admitting: Emergency Medicine

## 2022-05-14 NOTE — Telephone Encounter (Signed)
Attempted to call patient regarding upcoming cardiac CT appointment. °Left message on voicemail with name and callback number °Deepak Bless RN Navigator Cardiac Imaging °[facility] [facility] °[phone] Office °[phone] Cell ° °

## 2022-05-15 ENCOUNTER — Other Ambulatory Visit: Payer: Self-pay | Admitting: Cardiology

## 2022-05-15 ENCOUNTER — Ambulatory Visit (HOSPITAL_COMMUNITY)
Admission: RE | Admit: 2022-05-15 | Discharge: 2022-05-15 | Disposition: A | Discharge status: Home / Self Care | Payer: 59 | Source: Ambulatory Visit | Attending: Cardiology | Admitting: Cardiology

## 2022-05-15 DIAGNOSIS — I480 Paroxysmal atrial fibrillation: Secondary | ICD-10-CM | POA: Diagnosis not present

## 2022-05-15 DIAGNOSIS — R0602 Shortness of breath: Secondary | ICD-10-CM | POA: Diagnosis present

## 2022-05-15 DIAGNOSIS — G4733 Obstructive sleep apnea (adult) (pediatric): Secondary | ICD-10-CM

## 2022-05-15 MED ORDER — NITROGLYCERIN 0.4 MG SL SUBL
0.8000 mg | SUBLINGUAL_TABLET | Freq: Once | SUBLINGUAL | Status: AC
  Administered 2022-05-15: 0.8 mg via SUBLINGUAL

## 2022-05-15 MED ORDER — NITROGLYCERIN 0.4 MG SL SUBL
SUBLINGUAL_TABLET | SUBLINGUAL | Status: AC
  Filled 2022-05-15: qty 2

## 2022-05-15 MED ORDER — IOHEXOL 350 MG/ML SOLN
100.0000 mL | Freq: Once | INTRAVENOUS | Status: AC | PRN
  Administered 2022-05-15: 100 mL via INTRAVENOUS

## 2022-05-15 NOTE — Telephone Encounter (Signed)
Patient returned a call to me and was given sleep study results and recommendations. She agrees to proceed with CPAP titration pending [employer] approval.

## 2022-05-16 ENCOUNTER — Other Ambulatory Visit: Payer: Self-pay | Admitting: *Deleted

## 2022-05-16 DIAGNOSIS — R911 Solitary pulmonary nodule: Secondary | ICD-10-CM

## 2022-05-21 ENCOUNTER — Telehealth: Payer: Self-pay

## 2022-05-21 DIAGNOSIS — I493 Ventricular premature depolarization: Secondary | ICD-10-CM

## 2022-05-21 MED ORDER — METOPROLOL SUCCINATE ER 25 MG PO TB24: 50.0000 mg | ORAL_TABLET | Freq: Every day | ORAL | 1 refills | Status: DC

## 2022-05-21 NOTE — Telephone Encounter (Signed)
Received monitor report to notify MD. Had Dr. Caryl Comes, DOD, review. He recommend patient taking an extra dose of metoprolol if she had racing heart beat. Report will be imported for Dr. Gardiner Rhyme to review.   Called patient and informed her of the changes and informed her that Dr. Newman Nickels office will be in touch with her.

## 2022-05-22 ENCOUNTER — Telehealth: Payer: Self-pay | Admitting: *Deleted

## 2022-05-22 ENCOUNTER — Other Ambulatory Visit: Payer: Self-pay | Admitting: Nurse Practitioner

## 2022-05-22 ENCOUNTER — Telehealth: Payer: Self-pay | Admitting: Cardiology

## 2022-05-22 ENCOUNTER — Other Ambulatory Visit: Payer: Self-pay | Admitting: Cardiology

## 2022-05-22 ENCOUNTER — Encounter: Payer: Self-pay | Admitting: Nurse Practitioner

## 2022-05-22 DIAGNOSIS — G4736 Sleep related hypoventilation in conditions classified elsewhere: Secondary | ICD-10-CM

## 2022-05-22 DIAGNOSIS — G4733 Obstructive sleep apnea (adult) (pediatric): Secondary | ICD-10-CM

## 2022-05-22 DIAGNOSIS — I480 Paroxysmal atrial fibrillation: Secondary | ICD-10-CM

## 2022-05-22 NOTE — Telephone Encounter (Signed)
Caller is reporting abnormal results.

## 2022-05-22 NOTE — Telephone Encounter (Signed)
Patient returned call

## 2022-05-22 NOTE — Telephone Encounter (Signed)
Received call from [place] from [facility] to results. Spoke with patient who reports she felt skipped beats while on monitor. She denied dizziness and lightheadedness. The SOB she has is during treadmill exercise. While on phone, BP 109/66, P 74. Dr. Gardiner Rhyme (DOD) viewed monitor results and ordered EP referral/appointment for ablation evaluation. Called patient back and lmtcb.

## 2022-05-22 NOTE — Telephone Encounter (Signed)
Called and spoke with patient- patient is aware of Dr. Newman Nickels recommendations and referral already in the system. Patient states that she was told by her PCP to not exercise due to her symptoms/HR. Patient would like to know if she needs to hold off on exercising until she see's EP or if she can continue her normal work out. Advised patient I will forward to MD for review. Patient verbalized understanding.

## 2022-05-22 NOTE — Telephone Encounter (Signed)
Yes she is okay to exercise

## 2022-05-22 NOTE — Telephone Encounter (Signed)
Received a call from [facility] denying CPAP titration sleep study request.  APAP will be ordered per Dr Radford Pax and sent to [facility].

## 2022-05-23 NOTE — Telephone Encounter (Signed)
Patient aware and verbalized understanding. °

## 2022-06-12 ENCOUNTER — Other Ambulatory Visit: Payer: Self-pay | Admitting: Nurse Practitioner

## 2022-06-26 ENCOUNTER — Encounter: Payer: Self-pay | Admitting: Cardiovascular Disease

## 2022-06-26 ENCOUNTER — Ambulatory Visit: Payer: 59 | Attending: Cardiovascular Disease | Admitting: Cardiovascular Disease

## 2022-06-26 VITALS — BP 124/70 | HR 116 | Ht 67.5 in | Wt 188.0 lb

## 2022-06-26 DIAGNOSIS — I493 Ventricular premature depolarization: Secondary | ICD-10-CM | POA: Diagnosis not present

## 2022-06-26 DIAGNOSIS — I480 Paroxysmal atrial fibrillation: Secondary | ICD-10-CM

## 2022-06-26 DIAGNOSIS — I4891 Unspecified atrial fibrillation: Secondary | ICD-10-CM | POA: Insufficient documentation

## 2022-06-26 NOTE — Progress Notes (Signed)
Electrophysiology Office Note:    Date:  06/26/2022   ID:  Samantha, Terrell [birth year], MRN [id number]  PCP:  Samantha Comber, NP   [place] Providers Cardiologist:  None Electrophysiologist:  Samantha Quitter, MD     Referring MD: Samantha Terrell*   History of Present Illness:    Samantha Terrell is a 61 y.o. female with a hx as listed below significant for atrial fibrillation symptomatic PVCs referred to discuss arrhythmia management.  She wore an event monitor in 2016 for palpitations that just showed occasional to be PVCs that correlated with her symptom of skipped beats. Structural heart disease was excluded, and it appears she was lost to follow-up.  She presented to her PCP in August complaining of palpitations and fatigue.  ECG from August 30 showed atrial fibrillation.  She was referred to Dr. Gardiner Rhyme who started Eliquis and metoprolol and placed a Zio patch which showed an atrial fibrillation burden of 21% with, on average, controlled rates.  Coronary CT ordered to evaluate dyspnea showed no evidence of CAD.  Today, she has shortness of breath, fatigue, and palpitations. She feels that she has been in AF all day. No syncope, pre-syncope.  Past Medical History:  Diagnosis Date   Atrial fibrillation ([place])    Degenerative disc disease, cervical    Depression    Family history of adverse reaction to anesthesia    mother-- ponv   GAD (generalized anxiety disorder)    History of adenomatous polyp of colon    History of COVID-19 11/2020   per pt mild symptoms that resolved   Hypertension    PVC (premature ventricular contraction)    symptomatic intermittant palpitations  (event monitor in epic 12-03-2014 showed SR w/ PVCs)   SUI (stress urinary incontinence, female)     Past Surgical History:  Procedure Laterality Date   BLADDER SUSPENSION N/A 11/06/2021   Procedure: TRANSVAGINAL TAPE (TVT) PROCEDURE;  Surgeon: Samantha Charleston, MD;   Location: [place];  Service: Gynecology;  Laterality: N/A;   COLONOSCOPY WITH PROPOFOL  06/07/2021   by Dr.Beavers   CYSTOCELE REPAIR N/A 11/06/2021   Procedure: ANTERIOR REPAIR (CYSTOCELE);  Surgeon: Samantha Charleston, MD;  Location: [facility];  Service: Gynecology;  Laterality: N/A;   CYSTOSCOPY N/A 11/06/2021   Procedure: CYSTOSCOPY;  Surgeon: Samantha Charleston, MD;  Location: [facility];  Service: Gynecology;  Laterality: N/A;   NASAL SEPTUM SURGERY  1986    Current Medications: No outpatient medications have been marked as taking for the 06/26/22 encounter (Office Visit) with Rindy Kollman, Yetta Barre, MD.     Allergies:   Latex   Social History   Socioeconomic History   Marital status: Married    Spouse name: Not on file   Number of children: Not on file   Years of education: Not on file   Highest education level: Not on file  Occupational History   Not on file  Tobacco Use   Smoking status: Never   Smokeless tobacco: Never  Vaping Use   Vaping Use: Never used  Substance and Sexual Activity   Alcohol use: Yes    Alcohol/week: 6.0 standard drinks of alcohol    Types: 6 Glasses of wine per week   Drug use: Never   Sexual activity: Yes    Partners: Male    Birth control/protection: Post-menopausal  Other Topics Concern   Not on file  Social History Narrative   Not on file  Social Determinants of Health   Financial Resource Strain: Not on file  Food Insecurity: Not on file  Transportation Needs: Not on file  Physical Activity: Sufficiently Active (12/03/2017)   Exercise Vital Sign    Days of Exercise per Week: 4 days    Minutes of Exercise per Session: 50 min  Stress: No Stress Concern Present (12/03/2017)   [place]    Feeling of Stress : Only a little  Social Connections: Not on file     Family History: The patient's family history includes  Arthritis in her maternal grandfather and mother; Cancer in her maternal grandmother; Congestive Heart Failure in her maternal grandmother; Heart attack in her maternal grandmother; Hyperlipidemia in her father; Osteoporosis in her maternal grandmother and paternal grandmother; Vascular Disease in her maternal grandfather. There is no history of Colon cancer, Breast cancer, Colon polyps, Esophageal cancer, Rectal cancer, or Stomach cancer.  ROS:   Please see the history of present illness.    All other systems reviewed and are negative.  EKGs/Labs/Other Studies Reviewed Today:     7 day monitor: Sinus rhythm heart rate 49 to 120 bpm.  A-fib recurred 21% of the time (49-227 bpm, average 77 bpm). One patient-triggered event was associated with sinus rhythm  Coronary CTA 04/2022 CADRAD 0  EKG:  Last EKG results: today - AF with RVR   Recent Labs: 03/26/2022: ALT 19; Hemoglobin 15.0; Magnesium 1.9; Platelets 277; TSH 1.47 04/30/2022: BUN 10; Creatinine, Ser 0.71; Potassium 3.8; Sodium 139     Physical Exam:    VS:  BP 124/70   Pulse (!) 116   Ht 5' 7.5" (1.715 m)   Wt 188 lb (85.3 kg)   LMP  (LMP Unknown)   SpO2 99%   BMI 29.01 kg/m     Wt Readings from Last 3 Encounters:  06/26/22 188 lb (85.3 kg)  04/30/22 191 lb (86.6 kg)  03/26/22 188 lb 6.4 oz (85.5 kg)     GEN: Well nourished, well developed in no acute distress CARDIAC: Irregular rhythm, no murmurs, rubs, gallops RESPIRATORY:  Normal work of breathing MUSCULOSKELETAL: no edema    ASSESSMENT & PLAN:    Atrial fibrillation: paroxysmal, symptomatic. We discussed management options. I recommended rhythm control because she is symptomatic. We discussed options including antiarrhythmic drug therapy and ablation. We discussed the indication, rationale, logistics, anticipated benefits, and potential risks of the ablation procedure including but not limited to -- bleed at the groin access site, chest pain, damage to nearby  organs such as the diaphragm, lungs, or esophagus, need for a drainage tube, or prolonged hospitalization. I explained that the risk for stroke, heart attack, need for open chest surgery, or even death is very low but not zero. she  expressed understanding and wishes to proceed. Hypertension: BP controlled today        Medication Adjustments/Labs and Tests Ordered: Current medicines are reviewed at length with the patient today.  Concerns regarding medicines are outlined above.  Orders Placed This Encounter  Procedures   EKG 12-Lead   No orders of the defined types were placed in this encounter.    Signed, Samantha Quitter, MD  06/26/2022 3:44 PM    [place]

## 2022-06-26 NOTE — Patient Instructions (Signed)
Medication Instructions:  Your physician recommends that you continue on your current medications as directed. Please refer to the Current Medication list given to you today.  *If you need a refill on your cardiac medications before your next appointment, please call your pharmacy*  Lab Work: BMET and CBC prior to CT scan If you have labs (blood work) drawn today and your tests are completely normal, you will receive your results only by: MyChart Message (if you have MyChart) OR A paper copy in the mail If you have any lab test that is abnormal or we need to change your treatment, we will call you to review the results.   Testing/Procedures: Your physician has requested that you have cardiac CT. Cardiac computed tomography (CT) is a painless test that uses an x-ray machine to take clear, detailed pictures of your heart. For further information please visit www.cardiosmart.org. Please follow instruction sheet as given.  Your physician has recommended that you have an ablation. Catheter ablation is a medical procedure used to treat some cardiac arrhythmias (irregular heartbeats). During catheter ablation, a long, thin, flexible tube is put into a blood vessel in your groin (upper thigh), or neck. This tube is called an ablation catheter. It is then guided to your heart through the blood vessel. Radio frequency waves destroy small areas of heart tissue where abnormal heartbeats may cause an arrhythmia to start. Please see the instruction sheet given to you today.   Follow-Up: At [facility], you and your health needs are our priority.  As part of our continuing mission to provide you with exceptional heart care, we have created designated Provider Care Teams.  These Care Teams include your primary Cardiologist (physician) and Advanced Practice Providers (APPs -  Physician Assistants and Nurse Practitioners) who all work together to provide you with the care you need, when you need  it.  Your next appointment:   See instruction letter  Important Information About Sugar       

## 2022-06-26 NOTE — Addendum Note (Signed)
Addended by: Bernestine Amass on: 06/26/2022 03:49 PM   Modules accepted: Orders

## 2022-06-27 ENCOUNTER — Other Ambulatory Visit: Payer: Self-pay | Admitting: Nurse Practitioner

## 2022-07-02 ENCOUNTER — Encounter: Payer: Self-pay | Admitting: Nurse Practitioner

## 2022-07-14 ENCOUNTER — Other Ambulatory Visit: Payer: Self-pay | Admitting: Nurse Practitioner

## 2022-07-30 ENCOUNTER — Encounter: Payer: Self-pay | Admitting: Nurse Practitioner

## 2022-07-30 DIAGNOSIS — I4891 Unspecified atrial fibrillation: Secondary | ICD-10-CM

## 2022-08-01 ENCOUNTER — Other Ambulatory Visit: Payer: Self-pay | Admitting: Nurse Practitioner

## 2022-08-02 ENCOUNTER — Other Ambulatory Visit: Payer: Self-pay | Admitting: Nurse Practitioner

## 2022-08-03 MED ORDER — APIXABAN 5 MG PO TABS: 5.0000 mg | ORAL_TABLET | Freq: Two times a day (BID) | ORAL | 2 refills | Status: DC

## 2022-08-03 MED ORDER — APIXABAN 5 MG PO TABS: 5.0000 mg | ORAL_TABLET | Freq: Two times a day (BID) | ORAL | 1 refills | Status: DC

## 2022-08-08 ENCOUNTER — Encounter: Payer: Self-pay | Admitting: Cardiology

## 2022-08-08 NOTE — Telephone Encounter (Signed)
Error

## 2022-08-11 NOTE — Progress Notes (Signed)
Cardiology Office Note:    Date:  08/14/2022   ID:  Samantha Terrell, DOB 16-May-1961, MRN [id number]  PCP:  Liane Comber, NP  Cardiologist:  None  Electrophysiologist:  Melida Quitter, MD   Referring MD: Liane Comber, NP   Chief Complaint  Patient presents with   Atrial Fibrillation    History of Present Illness:    Samantha Terrell is a 62 y.o. female with a hx of atrial fibrillation, OSA, hyperlipidemia who presents for follow-up.  She was referred by Samantha Jump, NP for evaluation of atrial fibrillation, initially seen on 04/30/2022.  She was found at recent visit to be in A-fib.  Reports has been having palpitations.  Can happen daily and last up to 30 minutes.  Reports heart beat feels irregular and sometimes racing.  She has been taking Eliquis, denies any bleeding issues.  Does not report any chest pain but states she has been having shortness of breath when she tries to exercise on treadmill recently.  Happens after 10 to 15 minutes on the treadmill.  Reports some lightheadedness but denies any syncope.  No lower extremity edema.  No smoking history.  Family history includes sister has mitral valve prolapse and maternal grandmother had CHF.  Echocardiogram 05/05/2022 showed normal biventricular function, no significant valvular disease.  Coronary CTA on 05/15/2022 showed normal coronary arteries, calcium score 0.  Zio patch x 7 days on 05/21/2022 showed 21% A-fib burden with average rate 105 bpm.  Seen by Dr. Myles Terrell in EP, planning ablation.  Since last clinic visit, she reports that she is doing okay.  She continues to have palpitations, occurs daily.  She was unable to tolerate CPAP.  She is taking Eliquis, denies any bleeding issues.  Reports BP up to 140s when she checks at home.  Denies any chest pain, dyspnea, lightheadedness, syncope, lower extremity edema.    BP Readings from Last 3 Encounters:  08/14/22 (!) 148/82  06/26/22 124/70  05/15/22 137/85     Past  Medical History:  Diagnosis Date   Atrial fibrillation (HCC)    Degenerative disc disease, cervical    Depression    Family history of adverse reaction to anesthesia    mother-- ponv   GAD (generalized anxiety disorder)    History of adenomatous polyp of colon    History of COVID-19 11/2020   per pt mild symptoms that resolved   Hypertension    PVC (premature ventricular contraction)    symptomatic intermittant palpitations  (event monitor in epic 12-03-2014 showed SR w/ PVCs)   SUI (stress urinary incontinence, female)     Past Surgical History:  Procedure Laterality Date   BLADDER SUSPENSION N/A 11/06/2021   Procedure: TRANSVAGINAL TAPE (TVT) PROCEDURE;  Surgeon: Samantha Charleston, MD;  Location: [place];  Service: Gynecology;  Laterality: N/A;   COLONOSCOPY WITH PROPOFOL  06/07/2021   by Dr.Beavers   CYSTOCELE REPAIR N/A 11/06/2021   Procedure: ANTERIOR REPAIR (CYSTOCELE);  Surgeon: Samantha Charleston, MD;  Location: [facility];  Service: Gynecology;  Laterality: N/A;   CYSTOSCOPY N/A 11/06/2021   Procedure: CYSTOSCOPY;  Surgeon: Samantha Charleston, MD;  Location: [facility];  Service: Gynecology;  Laterality: N/A;   NASAL SEPTUM SURGERY  1986    Current Medications: Current Meds  Medication Sig   apixaban (ELIQUIS) 5 MG TABS tablet Take 1 tablet (5 mg total) by mouth 2 (two) times daily.   losartan (COZAAR) 25 MG tablet Take 1 tablet (25  mg total) by mouth daily.   Magnesium 500 MG TABS Take by mouth daily.   Methylcobalamin (B12 SL) Place under the tongue daily.   metoprolol succinate (TOPROL XL) 25 MG 24 hr tablet Take 2 tablets (50 mg total) by mouth daily. Take extra tablet for racing heart rate, more than 100 bpm. (Patient taking differently: Take 50 mg by mouth in the morning and at bedtime. Take extra tablet for racing heart rate, more than 100 bpm.)   norethindrone-ethinyl estradiol (FEMHRT 1/5) 1-5 MG-MCG TABS tablet  Take 1 tablet by mouth at bedtime.   Omega-3 Fatty Acids (FISH OIL) 1000 MG CPDR Take by mouth daily.   OVER THE COUNTER MEDICATION Take 5,000 mcg by mouth 2 (two) times a day. DHT blocker   sertraline (ZOLOFT) 100 MG tablet Take 50 mg by mouth daily with lunch.   TURMERIC PO Take by mouth daily.   Vitamin D, Cholecalciferol, 1000 UNITS CAPS Take by mouth at bedtime.     Allergies:   Latex   Social History   Socioeconomic History   Marital status: Married    Spouse name: Not on file   Number of children: Not on file   Years of education: Not on file   Highest education level: Not on file  Occupational History   Not on file  Tobacco Use   Smoking status: Never   Smokeless tobacco: Never  Vaping Use   Vaping Use: Never used  Substance and Sexual Activity   Alcohol use: Yes    Alcohol/week: 6.0 standard drinks of alcohol    Types: 6 Glasses of wine per week   Drug use: Never   Sexual activity: Yes    Partners: Male    Birth control/protection: Post-menopausal  Other Topics Concern   Not on file  Social History Narrative   Not on file   Social Determinants of Health   Financial Resource Strain: Not on file  Food Insecurity: Not on file  Transportation Needs: Not on file  Physical Activity: Sufficiently Active (12/03/2017)   Exercise Vital Sign    Days of Exercise per Week: 4 days    Minutes of Exercise per Session: 50 min  Stress: No Stress Concern Present (12/03/2017)   Samantha Terrell    Feeling of Stress : Only a little  Social Connections: Not on file     Family History: The patient's family history includes Arthritis in her maternal grandfather and mother; Cancer in her maternal grandmother; Congestive Heart Failure in her maternal grandmother; Heart attack in her maternal grandmother; Hyperlipidemia in her father; Osteoporosis in her maternal grandmother and paternal grandmother; Vascular Disease in her  maternal grandfather. There is no history of Colon cancer, Breast cancer, Colon polyps, Esophageal cancer, Rectal cancer, or Stomach cancer.  ROS:   Please see the history of present illness.     All other systems reviewed and are negative.  EKGs/Labs/Other Studies Reviewed:    The following studies were reviewed today:  EKG:   04/30/2022: Normal sinus rhythm, rate 67, nonspecific T wave flattening, less than 1 mm ST depressions in inferior leads  Recent Labs: 03/26/2022: ALT 19; Hemoglobin 15.0; Magnesium 1.9; Platelets 277; TSH 1.47 04/30/2022: BUN 10; Creatinine, Ser 0.71; Potassium 3.8; Sodium 139  Recent Lipid Panel    Component Value Date/Time   CHOL 194 03/26/2022 1643   TRIG 137 03/26/2022 1643   HDL 61 03/26/2022 1643   CHOLHDL 3.2 03/26/2022 1643  VLDL 27 11/27/2016 1541   LDLCALC 108 (H) 03/26/2022 1643    Physical Exam:    VS:  BP (!) 148/82 (BP Location: Left Arm, Patient Position: Sitting, Cuff Size: Normal)   Pulse 71   Ht '5\' 7"'$  (1.702 m)   Wt 194 lb 9.6 oz (88.3 kg)   LMP  (LMP Unknown)   SpO2 96%   BMI 30.48 kg/m     Wt Readings from Last 3 Encounters:  08/14/22 194 lb 9.6 oz (88.3 kg)  06/26/22 188 lb (85.3 kg)  04/30/22 191 lb (86.6 kg)     GEN:  Well nourished, well developed in no acute distress HEENT: Normal NECK: No JVD; No carotid bruits LYMPHATICS: No lymphadenopathy CARDIAC: RRR, no murmurs, rubs, gallops RESPIRATORY:  Clear to auscultation without rales, wheezing or rhonchi  ABDOMEN: Soft, non-tender, non-distended MUSCULOSKELETAL:  No edema; No deformity  SKIN: Warm and dry NEUROLOGIC:  Alert and oriented x 3 PSYCHIATRIC:  Normal affect   ASSESSMENT:    1. Paroxysmal atrial fibrillation (HCC)   2. DOE (dyspnea on exertion)   3. Essential hypertension   4. Hyperlipidemia, unspecified hyperlipidemia type   5. OSA (obstructive sleep apnea)     PLAN:    Atrial fibrillation: Zio patch x 7 days on 05/21/2022 showed 21% A-fib  burden with average rate 105 bpm.  Echocardiogram 05/05/2022 showed normal biventricular function, no significant valvular disease.CHA2DS2-VASc score 2 (hypertension, female) -Continue Eliquis 5 mg twice daily -Continue Toprol-XL 50 mg twice daily -Seen by Dr. Myles Terrell in EP, planning ablation.  DOE: Reports dyspnea with exertion.  Coronary CTA 05/15/2022 with normal coronary arteries.  Hyperlipidemia: LDL 108 on 03/26/2022.  10-year ASCVD risk score 5%.  Calcium score 0 on coronary CTA 05/15/2022  Hypertension: On Toprol-XL 50 mg twice daily.  BP elevated in clinic today, she reports elevated BP when she checks at home.  Will add losartan 25 mg daily.  Check BMET in 1 week.  Asked to check BP daily at home for next 2 weeks and call with results.  OSA: Moderate OSA on sleep study 04/2022, started CPAP but reports unable to tolerate.  Recommend follow-up with Dr. Radford Pax to discuss alternatives.  May be candidate for Inspire  Lung nodules: Small pulmonary nodules noted on coronary CTA 05/15/2022, noncontrast chest CT recommended in 1 year  RTC in 6 months   Medication Adjustments/Labs and Tests Ordered: Current medicines are reviewed at length with the patient today.  Concerns regarding medicines are outlined above.  Orders Placed This Encounter  Procedures   Basic metabolic panel   Meds ordered this encounter  Medications   losartan (COZAAR) 25 MG tablet    Sig: Take 1 tablet (25 mg total) by mouth daily.    Dispense:  90 tablet    Refill:  3    Patient Instructions  Medication Instructions:  Losartan '25mg'$  daily *If you need a refill on your cardiac medications before your next appointment, please call your pharmacy*   Lab Work: BMET in one week If you have labs (blood work) drawn today and your tests are completely normal, you will receive your results only by: [place] (if you have MyChart) OR A paper copy in the mail If you have any lab test that is abnormal or we need  to change your treatment, we will call you to review the results.   Follow-Up: At [facility], you and your health needs are our priority.  As part of our continuing mission  to provide you with exceptional heart care, we have created designated Provider Care Teams.  These Care Teams include your primary Cardiologist (physician) and Advanced Practice Providers (APPs -  Physician Assistants and Nurse Practitioners) who all work together to provide you with the care you need, when you need it.  We recommend signing up for the patient portal called "MyChart".  Sign up information is provided on this After Visit Summary.  MyChart is used to connect with patients for Virtual Visits (Telemedicine).  Patients are able to view lab/test results, encounter notes, upcoming appointments, etc.  Non-urgent messages can be sent to your provider as well.   To learn more about what you can do with MyChart, go to [email or web address].    Your next appointment:   6 month(s)  Provider:   Dr Gardiner Rhyme   Other Instructions Check BP's daily and keep a log for 2 weeks- Please send [place] a MyChart message with your logged BP's.     Signed, Donato Heinz, MD  08/14/2022 5:13 PM    [facility]

## 2022-08-14 ENCOUNTER — Encounter: Payer: Self-pay | Admitting: Cardiology

## 2022-08-14 ENCOUNTER — Ambulatory Visit: Payer: 59 | Attending: Cardiology | Admitting: Cardiology

## 2022-08-14 VITALS — BP 148/82 | HR 71 | Ht 67.0 in | Wt 194.6 lb

## 2022-08-14 DIAGNOSIS — E785 Hyperlipidemia, unspecified: Secondary | ICD-10-CM | POA: Diagnosis not present

## 2022-08-14 DIAGNOSIS — I10 Essential (primary) hypertension: Secondary | ICD-10-CM | POA: Diagnosis not present

## 2022-08-14 DIAGNOSIS — R0609 Other forms of dyspnea: Secondary | ICD-10-CM

## 2022-08-14 DIAGNOSIS — I480 Paroxysmal atrial fibrillation: Secondary | ICD-10-CM

## 2022-08-14 DIAGNOSIS — G4733 Obstructive sleep apnea (adult) (pediatric): Secondary | ICD-10-CM

## 2022-08-14 MED ORDER — LOSARTAN POTASSIUM 25 MG PO TABS: 25.0000 mg | ORAL_TABLET | Freq: Every day | ORAL | 3 refills | Status: DC

## 2022-08-14 NOTE — Patient Instructions (Signed)
Medication Instructions:  Losartan '25mg'$  daily *If you need a refill on your cardiac medications before your next appointment, please call your pharmacy*   Lab Work: BMET in one week If you have labs (blood work) drawn today and your tests are completely normal, you will receive your results only by: [place] (if you have MyChart) OR A paper copy in the mail If you have any lab test that is abnormal or we need to change your treatment, we will call you to review the results.   Follow-Up: At [facility], you and your health needs are our priority.  As part of our continuing mission to provide you with exceptional heart care, we have created designated Provider Care Teams.  These Care Teams include your primary Cardiologist (physician) and Advanced Practice Providers (APPs -  Physician Assistants and Nurse Practitioners) who all work together to provide you with the care you need, when you need it.  We recommend signing up for the patient portal called "MyChart".  Sign up information is provided on this After Visit Summary.  MyChart is used to connect with patients for Virtual Visits (Telemedicine).  Patients are able to view lab/test results, encounter notes, upcoming appointments, etc.  Non-urgent messages can be sent to your provider as well.   To learn more about what you can do with MyChart, go to [email or web address].    Your next appointment:   6 month(s)  Provider:   Dr Gardiner Rhyme   Other Instructions Check BP's daily and keep a log for 2 weeks- Please send [place] a MyChart message with your logged BP's.

## 2022-08-22 LAB — BASIC METABOLIC PANEL
BUN/Creatinine Ratio: 14 (ref 9–23)
BUN: 10 mg/dL (ref 6–24)
CO2: 21 mmol/L (ref 20–29)
Calcium: 9.5 mg/dL (ref 8.7–10.2)
Chloride: 103 mmol/L (ref 96–106)
Creatinine, Ser: 0.74 mg/dL (ref 0.57–1.00)
Glucose: 89 mg/dL (ref 70–99)
Potassium: 4.6 mmol/L (ref 3.5–5.2)
Sodium: 139 mmol/L (ref 134–144)
eGFR: 93 mL/min/{1.73_m2} (ref >59)

## 2022-08-25 ENCOUNTER — Ambulatory Visit: Payer: Commercial Managed Care - PPO | Attending: Cardiology | Admitting: Cardiology

## 2022-08-25 ENCOUNTER — Encounter: Payer: Self-pay | Admitting: Cardiology

## 2022-08-25 VITALS — BP 116/72 | HR 110 | Ht 67.5 in | Wt 188.0 lb

## 2022-08-25 DIAGNOSIS — G4733 Obstructive sleep apnea (adult) (pediatric): Secondary | ICD-10-CM | POA: Diagnosis not present

## 2022-08-25 DIAGNOSIS — I10 Essential (primary) hypertension: Secondary | ICD-10-CM

## 2022-08-25 NOTE — Addendum Note (Signed)
Addended by: Eather Colas L on: 08/25/2022 11:11 AM   Modules accepted: Orders

## 2022-08-25 NOTE — Progress Notes (Addendum)
Sleep Medicine CONSULT Note    Date:  08/25/2022   ID:  Samantha Terrell, DOB 01-19-1961, MRN [id number]  PCP:  Liane Comber, NP  Cardiologist: None   Chief Complaint  Patient presents with   New Patient (Initial Visit)    OSA    History of Present Illness:  Samantha Terrell is a 62 y.o. female who is being seen today for the evaluation of OSA at the request of Oswaldo Milian, MD.  This is a 62 year old female with a history of atrial fibrillation and hypertension followed by Dr. Gilman Schmidt.  When she saw him back in October he ordered a sleep study because she complained of feeling sleepy during the day and also snored a lot.  Sleep study showed moderate obstructive sleep apnea with an AHI of 27.3/h with no central events.  A CPAP titration was recommended but insurance would not pay for it so she was started on auto CPAP from 4 to 15 cm H2O.  She is now referred to establish sleep medicine care for further treatment of obstructive sleep apnea  She tells me that she gets very claustrophobic and so she could not use the CPAP because she does not tolerate the mask.  She tried it for a week and would fall asleep and would wake up an hour later and could not breathe. She only tried the [facility] but breathes through her mouth so the nasal pillow as not an option.   Past Medical History:  Diagnosis Date   Atrial fibrillation ([place])    Degenerative disc disease, cervical    Depression    Family history of adverse reaction to anesthesia    mother-- ponv   GAD (generalized anxiety disorder)    History of adenomatous polyp of colon    History of COVID-19 11/2020   per pt mild symptoms that resolved   Hypertension    PVC (premature ventricular contraction)    symptomatic intermittant palpitations  (event monitor in epic 12-03-2014 showed SR w/ PVCs)   SUI (stress urinary incontinence, female)     Past Surgical History:  Procedure Laterality Date   BLADDER SUSPENSION N/A 11/06/2021    Procedure: TRANSVAGINAL TAPE (TVT) PROCEDURE;  Surgeon: Bobbye Charleston, MD;  Location: [facility];  Service: Gynecology;  Laterality: N/A;   COLONOSCOPY WITH PROPOFOL  06/07/2021   by Dr.Beavers   CYSTOCELE REPAIR N/A 11/06/2021   Procedure: ANTERIOR REPAIR (CYSTOCELE);  Surgeon: Bobbye Charleston, MD;  Location: [facility];  Service: Gynecology;  Laterality: N/A;   CYSTOSCOPY N/A 11/06/2021   Procedure: CYSTOSCOPY;  Surgeon: Bobbye Charleston, MD;  Location: [facility];  Service: Gynecology;  Laterality: N/A;   NASAL SEPTUM SURGERY  1986    Current Medications: Current Meds  Medication Sig   apixaban (ELIQUIS) 5 MG TABS tablet Take 1 tablet (5 mg total) by mouth 2 (two) times daily.   losartan (COZAAR) 25 MG tablet Take 1 tablet (25 mg total) by mouth daily.   Magnesium 500 MG TABS Take by mouth daily.   Methylcobalamin (B12 SL) Place under the tongue daily.   metoprolol succinate (TOPROL XL) 25 MG 24 hr tablet Take 2 tablets (50 mg total) by mouth daily. Take extra tablet for racing heart rate, more than 100 bpm. (Patient taking differently: Take 50 mg by mouth in the morning and at bedtime. Take extra tablet for racing heart rate, more than 100 bpm.)   norethindrone-ethinyl estradiol (FEMHRT 1/5) 1-5 MG-MCG  TABS tablet Take 1 tablet by mouth at bedtime.   Omega-3 Fatty Acids (FISH OIL) 1000 MG CPDR Take by mouth daily.   OVER THE COUNTER MEDICATION Take 5,000 mcg by mouth 2 (two) times a day. DHT blocker   sertraline (ZOLOFT) 100 MG tablet Take 50 mg by mouth daily with lunch.   TURMERIC PO Take by mouth daily.   Vitamin D, Cholecalciferol, 1000 UNITS CAPS Take by mouth at bedtime.    Allergies:   Latex   Social History   Socioeconomic History   Marital status: Married    Spouse name: Not on file   Number of children: Not on file   Years of education: Not on file   Highest education level: Not on file  Occupational History    Not on file  Tobacco Use   Smoking status: Never   Smokeless tobacco: Never  Vaping Use   Vaping Use: Never used  Substance and Sexual Activity   Alcohol use: Yes    Alcohol/week: 6.0 standard drinks of alcohol    Types: 6 Glasses of wine per week   Drug use: Never   Sexual activity: Yes    Partners: Male    Birth control/protection: Post-menopausal  Other Topics Concern   Not on file  Social History Narrative   Not on file   Social Determinants of Health   Financial Resource Strain: Not on file  Food Insecurity: Not on file  Transportation Needs: Not on file  Physical Activity: Sufficiently Active (12/03/2017)   Exercise Vital Sign    Days of Exercise per Week: 4 days    Minutes of Exercise per Session: 50 min  Stress: No Stress Concern Present (12/03/2017)   [place]    Feeling of Stress : Only a little  Social Connections: Not on file     Family History:  The patient's family history includes Arthritis in her maternal grandfather and mother; Cancer in her maternal grandmother; Congestive Heart Failure in her maternal grandmother; Heart attack in her maternal grandmother; Hyperlipidemia in her father; Osteoporosis in her maternal grandmother and paternal grandmother; Vascular Disease in her maternal grandfather.   ROS:   Please see the history of present illness.    ROS All other systems reviewed and are negative.      No data to display             PHYSICAL EXAM:   VS:  BP 116/72   Pulse (!) 110   Ht 5' 7.5" (1.715 m)   Wt 188 lb (85.3 kg)   LMP  (LMP Unknown)   SpO2 97%   BMI 29.01 kg/m    GEN: Well nourished, well developed, in no acute distress  HEENT: normal  Neck: no JVD, carotid bruits, or masses Cardiac: RRR; no murmurs, rubs, or gallops,no edema.  Intact distal pulses bilaterally.  Respiratory:  clear to auscultation bilaterally, normal work of breathing GI: soft, nontender,  nondistended, + BS MS: no deformity or atrophy  Skin: warm and dry, no rash Neuro:  Alert and Oriented x 3, Strength and sensation are intact Psych: euthymic mood, full affect  Wt Readings from Last 3 Encounters:  08/25/22 188 lb (85.3 kg)  08/14/22 194 lb 9.6 oz (88.3 kg)  06/26/22 188 lb (85.3 kg)      Studies/Labs Reviewed:   Home sleep study and PAP compliance download  Recent Labs: 03/26/2022: ALT 19; Hemoglobin 15.0; Magnesium 1.9; Platelets 277; TSH 1.47  08/21/2022: BUN 10; Creatinine, Ser 0.74; Potassium 4.6; Sodium 139    ASSESSMENT:    1. Obstructive sleep apnea (adult) (pediatric)   2. Essential hypertension      PLAN:  In order of problems listed above:  OSA - The patient is tolerating PAP therapy well without any problems. The PAP download performed by his DME was personally reviewed and interpreted by me today and showed an AHI of 6.2/hr on auto CPAP from 4 to 15 cm H2O with 0% compliance in using more than 4 hours nightly.  The patient has been using and benefiting from PAP use and will continue to benefit from therapy.  -she is intolerant of the CPAP as she is very claustrophobic and cannot use her PAP device -she is a mouth breather and given the severity of her OSA I do not think she would benefit from trying a nasal mask - she has an oral device already but she can not sleep in it or breathe in it she feels claustrophobic with it.  -I think she would be a good candidate for the Inspire device so I will refer her to Dr. Jenne Pane for evaluation.   2.  Hypertension -BP controlled -Continue prescription drug management with losartan 25 mg daily and Toprol-XL 50 mg daily with as needed refills   Time Spent: 20 minutes total time of encounter, including 15 minutes spent in face-to-face patient care on the date of this encounter. This time includes coordination of care and counseling regarding above mentioned problem list. Remainder of non-face-to-face time involved  reviewing chart documents/testing relevant to the patient encounter and documentation in the medical record. I have independently reviewed documentation from referring provider  Medication Adjustments/Labs and Tests Ordered: Current medicines are reviewed at length with the patient today.  Concerns regarding medicines are outlined above.  Medication changes, Labs and Tests ordered today are listed in the Patient Instructions below.  There are no Patient Instructions on file for this visit.   Signed, Armanda Magic, MD  08/25/2022 10:53 AM    [facility] [address] [place], [place], [place]  [address] Phone: [phone]; Fax: [phone]

## 2022-08-25 NOTE — Patient Instructions (Addendum)
Medication Instructions:  Your physician recommends that you continue on your current medications as directed. Please refer to the Current Medication list given to you today.  *If you need a refill on your cardiac medications before your next appointment, please call your pharmacy*   Lab Work: None.  If you have labs (blood work) drawn today and your tests are completely normal, you will receive your results only by: [place] (if you have MyChart) OR A paper copy in the mail If you have any lab test that is abnormal or we need to change your treatment, we will call you to review the results.   Testing/Procedures: None.   Follow-Up:   Your next appointment with be as needed with :    Provider:   Dr. Fransico Him, MD   Other Instructions You have been referred to Dr. Melida Quitter for further workup of your sleep apnea.  [address], [place], [place] [address] Phone: [phone]

## 2022-08-28 ENCOUNTER — Telehealth (HOSPITAL_COMMUNITY): Payer: Self-pay | Admitting: Emergency Medicine

## 2022-08-28 NOTE — Telephone Encounter (Signed)
Reaching out to patient to offer assistance regarding upcoming cardiac imaging study; pt verbalizes understanding of appt date/time, parking situation and where to check in, pre-test NPO status and medications ordered, and verified current allergies; name and call back number provided for further questions should they arise Samantha Bond RN Navigator Cardiac Imaging [facility] [phone] office [phone] cell  Arrival 100 WC entrance Daily meds '50mg'$  metop succ 2 hr prior to scan Aware contrast/nitro

## 2022-08-29 ENCOUNTER — Ambulatory Visit (HOSPITAL_COMMUNITY)
Admission: RE | Admit: 2022-08-29 | Discharge: 2022-08-29 | Disposition: A | Discharge status: Home / Self Care | Payer: Commercial Managed Care - PPO | Source: Ambulatory Visit | Attending: Cardiovascular Disease | Admitting: Cardiovascular Disease

## 2022-08-29 DIAGNOSIS — I480 Paroxysmal atrial fibrillation: Secondary | ICD-10-CM

## 2022-08-29 DIAGNOSIS — I493 Ventricular premature depolarization: Secondary | ICD-10-CM | POA: Diagnosis present

## 2022-08-29 MED ORDER — IOHEXOL 350 MG/ML SOLN
100.0000 mL | Freq: Once | INTRAVENOUS | Status: AC | PRN
  Administered 2022-08-29: 100 mL via INTRAVENOUS

## 2022-08-29 MED ORDER — DILTIAZEM HCL 25 MG/5ML IV SOLN
INTRAVENOUS | Status: AC
  Administered 2022-08-29: 2.5 mg via INTRAVENOUS
  Filled 2022-08-29: qty 5

## 2022-08-29 MED ORDER — DILTIAZEM HCL 25 MG/5ML IV SOLN: 5.0000 mg | Freq: Once | INTRAVENOUS | Status: AC

## 2022-08-30 ENCOUNTER — Encounter: Payer: Self-pay | Admitting: Cardiology

## 2022-08-30 DIAGNOSIS — I10 Essential (primary) hypertension: Secondary | ICD-10-CM

## 2022-09-02 NOTE — Telephone Encounter (Signed)
BP remains elevated, recommend increase losartan to 50 mg daily and check BMET in 1 week

## 2022-09-03 ENCOUNTER — Encounter: Payer: Self-pay | Admitting: Cardiovascular Disease

## 2022-09-03 MED ORDER — LOSARTAN POTASSIUM 25 MG PO TABS: 50.0000 mg | ORAL_TABLET | Freq: Every day | ORAL | 3 refills | Status: DC

## 2022-09-04 NOTE — Pre-Procedure Instructions (Signed)
Instructed patient on the following items: Arrival time 0515 Nothing to eat or drink after midnight No meds AM of procedure Responsible person to drive you home and stay with you for 24 hrs  Have you missed any doses of anti-coagulant Eliquis- hasn't missed any doses   

## 2022-09-05 ENCOUNTER — Encounter (HOSPITAL_COMMUNITY)
Admission: RE | Disposition: A | Discharge status: Home / Self Care | Payer: Self-pay | Source: Ambulatory Visit | Attending: Cardiovascular Disease

## 2022-09-05 ENCOUNTER — Other Ambulatory Visit (HOSPITAL_COMMUNITY): Payer: Self-pay

## 2022-09-05 ENCOUNTER — Ambulatory Visit (HOSPITAL_COMMUNITY)
Admission: RE | Admit: 2022-09-05 | Discharge: 2022-09-05 | Disposition: A | Discharge status: Home / Self Care | Payer: Commercial Managed Care - PPO | Source: Ambulatory Visit | Attending: Cardiovascular Disease | Admitting: Cardiovascular Disease

## 2022-09-05 ENCOUNTER — Ambulatory Visit (HOSPITAL_COMMUNITY): Payer: Commercial Managed Care - PPO | Admitting: Anesthesiology

## 2022-09-05 ENCOUNTER — Encounter (HOSPITAL_COMMUNITY): Payer: Self-pay | Admitting: Cardiovascular Disease

## 2022-09-05 ENCOUNTER — Ambulatory Visit (HOSPITAL_BASED_OUTPATIENT_CLINIC_OR_DEPARTMENT_OTHER): Payer: Commercial Managed Care - PPO | Admitting: Anesthesiology

## 2022-09-05 DIAGNOSIS — I493 Ventricular premature depolarization: Secondary | ICD-10-CM

## 2022-09-05 DIAGNOSIS — I4891 Unspecified atrial fibrillation: Secondary | ICD-10-CM

## 2022-09-05 DIAGNOSIS — F419 Anxiety disorder, unspecified: Secondary | ICD-10-CM

## 2022-09-05 DIAGNOSIS — Z86010 Personal history of colonic polyps: Secondary | ICD-10-CM

## 2022-09-05 DIAGNOSIS — Z7901 Long term (current) use of anticoagulants: Secondary | ICD-10-CM | POA: Insufficient documentation

## 2022-09-05 DIAGNOSIS — I10 Essential (primary) hypertension: Secondary | ICD-10-CM | POA: Diagnosis not present

## 2022-09-05 DIAGNOSIS — Z8249 Family history of ischemic heart disease and other diseases of the circulatory system: Secondary | ICD-10-CM | POA: Diagnosis not present

## 2022-09-05 DIAGNOSIS — I480 Paroxysmal atrial fibrillation: Secondary | ICD-10-CM | POA: Diagnosis not present

## 2022-09-05 DIAGNOSIS — E669 Obesity, unspecified: Secondary | ICD-10-CM

## 2022-09-05 DIAGNOSIS — E559 Vitamin D deficiency, unspecified: Secondary | ICD-10-CM

## 2022-09-05 DIAGNOSIS — R002 Palpitations: Secondary | ICD-10-CM

## 2022-09-05 DIAGNOSIS — E538 Deficiency of other specified B group vitamins: Secondary | ICD-10-CM

## 2022-09-05 DIAGNOSIS — Z79899 Other long term (current) drug therapy: Secondary | ICD-10-CM

## 2022-09-05 DIAGNOSIS — L658 Other specified nonscarring hair loss: Secondary | ICD-10-CM

## 2022-09-05 DIAGNOSIS — I253 Aneurysm of heart: Secondary | ICD-10-CM

## 2022-09-05 HISTORY — PX: ATRIAL FIBRILLATION ABLATION: EP1191

## 2022-09-05 LAB — CBC
HCT: 43.5 % (ref 36.0–46.0)
Hemoglobin: 14.8 g/dL (ref 12.0–15.0)
MCH: 30.9 pg (ref 26.0–34.0)
MCHC: 34 g/dL (ref 30.0–36.0)
MCV: 90.8 fL (ref 80.0–100.0)
Platelets: 230 10*3/uL (ref 150–400)
RBC: 4.79 MIL/uL (ref 3.87–5.11)
RDW: 12.4 % (ref 11.5–15.5)
WBC: 7.9 10*3/uL (ref 4.0–10.5)
nRBC: 0 % (ref 0.0–0.2)

## 2022-09-05 LAB — POCT ACTIVATED CLOTTING TIME
Activated Clotting Time: 287 seconds
Activated Clotting Time: 342 seconds

## 2022-09-05 SURGERY — ATRIAL FIBRILLATION ABLATION
Anesthesia: General

## 2022-09-05 MED ORDER — DEXAMETHASONE SODIUM PHOSPHATE 10 MG/ML IJ SOLN
INTRAMUSCULAR | Status: DC | PRN
  Administered 2022-09-05: 8 mg via INTRAVENOUS

## 2022-09-05 MED ORDER — SODIUM CHLORIDE 0.9 % IV SOLN: 250.0000 mL | INTRAVENOUS | Status: DC | PRN

## 2022-09-05 MED ORDER — LIDOCAINE 2% (20 MG/ML) 5 ML SYRINGE
INTRAMUSCULAR | Status: DC | PRN
  Administered 2022-09-05: 60 mg via INTRAVENOUS

## 2022-09-05 MED ORDER — SODIUM CHLORIDE 0.9 % IV SOLN: INTRAVENOUS | Status: DC

## 2022-09-05 MED ORDER — PROTAMINE SULFATE 10 MG/ML IV SOLN
INTRAVENOUS | Status: DC | PRN
  Administered 2022-09-05: 50 mg via INTRAVENOUS

## 2022-09-05 MED ORDER — ROCURONIUM BROMIDE 10 MG/ML (PF) SYRINGE
PREFILLED_SYRINGE | INTRAVENOUS | Status: DC | PRN
  Administered 2022-09-05: 90 mg via INTRAVENOUS

## 2022-09-05 MED ORDER — PHENYLEPHRINE 80 MCG/ML (10ML) SYRINGE FOR IV PUSH (FOR BLOOD PRESSURE SUPPORT)
PREFILLED_SYRINGE | INTRAVENOUS | Status: DC | PRN
  Administered 2022-09-05 (×2): 80 ug via INTRAVENOUS
  Administered 2022-09-05: 120 ug via INTRAVENOUS
  Administered 2022-09-05: 240 ug via INTRAVENOUS

## 2022-09-05 MED ORDER — HEPARIN (PORCINE) IN NACL 1000-0.9 UT/500ML-% IV SOLN
INTRAVENOUS | Status: DC | PRN
  Administered 2022-09-05 (×4): 500 mL

## 2022-09-05 MED ORDER — FENTANYL CITRATE (PF) 250 MCG/5ML IJ SOLN
INTRAMUSCULAR | Status: DC | PRN
  Administered 2022-09-05: 100 ug via INTRAVENOUS

## 2022-09-05 MED ORDER — HEPARIN SODIUM (PORCINE) 1000 UNIT/ML IJ SOLN
INTRAMUSCULAR | Status: DC | PRN
  Administered 2022-09-05: 1000 [IU] via INTRAVENOUS

## 2022-09-05 MED ORDER — ONDANSETRON HCL 4 MG/2ML IJ SOLN: 4.0000 mg | Freq: Four times a day (QID) | INTRAMUSCULAR | Status: DC | PRN

## 2022-09-05 MED ORDER — HEPARIN SODIUM (PORCINE) 1000 UNIT/ML IJ SOLN
INTRAMUSCULAR | Status: DC | PRN
  Administered 2022-09-05: 2000 [IU] via INTRAVENOUS
  Administered 2022-09-05: 15000 [IU] via INTRAVENOUS

## 2022-09-05 MED ORDER — EPHEDRINE SULFATE-NACL 50-0.9 MG/10ML-% IV SOSY
PREFILLED_SYRINGE | INTRAVENOUS | Status: DC | PRN
  Administered 2022-09-05: 5 mg via INTRAVENOUS

## 2022-09-05 MED ORDER — PROPOFOL 10 MG/ML IV BOLUS
INTRAVENOUS | Status: DC | PRN
  Administered 2022-09-05: 150 mg via INTRAVENOUS

## 2022-09-05 MED ORDER — HEPARIN (PORCINE) IN NACL 1000-0.9 UT/500ML-% IV SOLN
INTRAVENOUS | Status: AC
  Filled 2022-09-05: qty 2000

## 2022-09-05 MED ORDER — PANTOPRAZOLE SODIUM 40 MG PO TBEC
40.0000 mg | DELAYED_RELEASE_TABLET | Freq: Every day | ORAL | 0 refills | Status: DC
  Filled 2022-09-05: qty 30, 30d supply, fill #0

## 2022-09-05 MED ORDER — DOBUTAMINE INFUSION FOR EP/ECHO/NUC (1000 MCG/ML)
INTRAVENOUS | Status: AC
  Filled 2022-09-05: qty 250

## 2022-09-05 MED ORDER — ACETAMINOPHEN 325 MG PO TABS
650.0000 mg | ORAL_TABLET | ORAL | Status: DC | PRN
  Administered 2022-09-05: 650 mg via ORAL
  Filled 2022-09-05: qty 2

## 2022-09-05 MED ORDER — SODIUM CHLORIDE 0.9% FLUSH: 3.0000 mL | Freq: Two times a day (BID) | INTRAVENOUS | Status: DC

## 2022-09-05 MED ORDER — SUGAMMADEX SODIUM 200 MG/2ML IV SOLN
INTRAVENOUS | Status: DC | PRN
  Administered 2022-09-05 (×2): 100 mg via INTRAVENOUS

## 2022-09-05 MED ORDER — MIDAZOLAM HCL 2 MG/2ML IJ SOLN
INTRAMUSCULAR | Status: DC | PRN
  Administered 2022-09-05 (×2): 1 mg via INTRAVENOUS

## 2022-09-05 MED ORDER — SODIUM CHLORIDE 0.9% FLUSH: 3.0000 mL | INTRAVENOUS | Status: DC | PRN

## 2022-09-05 MED ORDER — PHENYLEPHRINE HCL-NACL 20-0.9 MG/250ML-% IV SOLN
INTRAVENOUS | Status: DC | PRN
  Administered 2022-09-05: 40 ug/min via INTRAVENOUS

## 2022-09-05 MED ORDER — ONDANSETRON HCL 4 MG/2ML IJ SOLN
INTRAMUSCULAR | Status: DC | PRN
  Administered 2022-09-05: 4 mg via INTRAVENOUS

## 2022-09-05 MED ORDER — DOBUTAMINE INFUSION FOR EP/ECHO/NUC (1000 MCG/ML)
INTRAVENOUS | Status: DC | PRN
  Administered 2022-09-05: 20 ug/kg/min via INTRAVENOUS

## 2022-09-05 MED ORDER — COLCHICINE 0.6 MG PO TABS
0.6000 mg | ORAL_TABLET | Freq: Two times a day (BID) | ORAL | 0 refills | Status: DC
  Filled 2022-09-05: qty 10, 5d supply, fill #0

## 2022-09-05 MED ORDER — HEPARIN SODIUM (PORCINE) 1000 UNIT/ML IJ SOLN
INTRAMUSCULAR | Status: AC
  Filled 2022-09-05: qty 10

## 2022-09-05 SURGICAL SUPPLY — 19 items
BAG SNAP BAND KOVER 36X36 (MISCELLANEOUS) IMPLANT
CATH ABLAT QDOT MICRO BI TC DF (CATHETERS) IMPLANT
CATH OCTARAY 2.0 F 3-3-3-3-3 (CATHETERS) IMPLANT
CATH PIGTAIL STEERABLE D1 8.7 (WIRE) IMPLANT
CATH S-M CIRCA TEMP PROBE (CATHETERS) IMPLANT
CATH SOUNDSTAR ECO 8FR (CATHETERS) IMPLANT
CATH WEBSTER BI DIR CS D-F CRV (CATHETERS) IMPLANT
CLOSURE PERCLOSE PROSTYLE (VASCULAR PRODUCTS) IMPLANT
COVER SWIFTLINK CONNECTOR (BAG) ×2 IMPLANT
DEVICE CLOSURE MYNXGRIP 6/7F (Vascular Products) IMPLANT
PACK EP LATEX FREE (CUSTOM PROCEDURE TRAY) ×1
PACK EP LF (CUSTOM PROCEDURE TRAY) ×2 IMPLANT
PAD DEFIB RADIO PHYSIO CONN (PAD) ×2 IMPLANT
PATCH CARTO3 (PAD) IMPLANT
SHEATH CARTO VIZIGO MED CURVE (SHEATH) IMPLANT
SHEATH PINNACLE 8F 10CM (SHEATH) IMPLANT
SHEATH PINNACLE 9F 10CM (SHEATH) IMPLANT
SHEATH PROBE COVER 6X72 (BAG) IMPLANT
TUBING SMART ABLATE COOLFLOW (TUBING) IMPLANT

## 2022-09-05 NOTE — Anesthesia Preprocedure Evaluation (Signed)
Anesthesia Evaluation  Patient identified by MRN, date of birth, ID band Patient awake    Reviewed: Allergy & Precautions, H&P , NPO status , Patient's Chart, lab work & pertinent test results  Airway Mallampati: II  TM Distance: >3 FB Neck ROM: Full    Dental no notable dental hx. (+) Teeth Intact, Dental Advisory Given   Pulmonary neg pulmonary ROS   Pulmonary exam normal breath sounds clear to auscultation       Cardiovascular Exercise Tolerance: Good hypertension, Pt. on medications and Pt. on home beta blockers  Rhythm:Regular Rate:Normal     Neuro/Psych   Anxiety Depression    negative neurological ROS     GI/Hepatic negative GI ROS, Neg liver ROS,,,  Endo/Other  negative endocrine ROS    Renal/GU negative Renal ROS  negative genitourinary   Musculoskeletal  (+) Arthritis , Osteoarthritis,    Abdominal   Peds  Hematology negative hematology ROS (+)   Anesthesia Other Findings   Reproductive/Obstetrics negative OB ROS                             Anesthesia Physical Anesthesia Plan  ASA: 2  Anesthesia Plan: General   Post-op Pain Management: Minimal or no pain anticipated   Induction: Intravenous  PONV Risk Score and Plan: 3 and Midazolam, Dexamethasone, Ondansetron and Treatment may vary due to age or medical condition  Airway Management Planned: Oral ETT  Additional Equipment:   Intra-op Plan:   Post-operative Plan: Extubation in OR  Informed Consent: I have reviewed the patients History and Physical, chart, labs and discussed the procedure including the risks, benefits and alternatives for the proposed anesthesia with the patient or authorized representative who has indicated his/her understanding and acceptance.     Dental advisory given  Plan Discussed with: CRNA  Anesthesia Plan Comments:         Anesthesia Quick Evaluation

## 2022-09-05 NOTE — Discharge Instructions (Addendum)
Femoral Site Care This sheet gives you information about how to care for yourself after your procedure. Your health care provider may also give you more specific instructions. If you have problems or questions, contact your health care provider. What can I expect after the procedure?  After the procedure, it is common to have: Bruising that usually fades within 1-2 weeks. Tenderness at the site. Follow these instructions at home: Wound care Follow instructions from your health care provider about how to take care of your insertion site. Make sure you: Wash your hands with soap and water before you change your bandage (dressing). If soap and water are not available, use hand sanitizer. Remove your dressing as told by your health care provider. In 24 hours Do not take baths, swim, or use a hot tub until your health care provider approves. You may shower 24-48 hours after the procedure or as told by your health care provider. Gently wash the site with plain soap and water. Pat the area dry with a clean towel. Do not rub the site. This may cause bleeding. Do not apply powder or lotion to the site. Keep the site clean and dry. Check your femoral site every day for signs of infection. Check for: Redness, swelling, or pain. Fluid or blood. Warmth. Pus or a bad smell. Activity For the first 2-3 days after your procedure, or as long as directed: Avoid climbing stairs as much as possible. Do not squat. Do not lift anything that is heavier than 10 lb (4.5 kg), or the limit that you are told, until your health care provider says that it is safe. For 5 days Rest as directed. Avoid sitting for a long time without moving. Get up to take short walks every 1-2 hours. Do not drive for 24 hours if you were given a medicine to help you relax (sedative). General instructions Take over-the-counter and prescription medicines only as told by your health care provider. Keep all follow-up visits as told by  your health care provider. This is important. Contact a health care provider if you have: A fever or chills. You have redness, swelling, or pain around your insertion site. Get help right away if: The catheter insertion area swells very fast. You pass out. You suddenly start to sweat or your skin gets clammy. The catheter insertion area is bleeding, and the bleeding does not stop when you hold steady pressure on the area. The area near or just beyond the catheter insertion site becomes pale, cool, tingly, or numb. These symptoms may represent a serious problem that is an emergency. Do not wait to see if the symptoms will go away. Get medical help right away. Call your local emergency services (911 in the [place] Do not drive yourself to the hospital. Summary After the procedure, it is common to have bruising that usually fades within 1-2 weeks. Check your femoral site every day for signs of infection. Do not lift anything that is heavier than 10 lb (4.5 kg), or the limit that you are told, until your health care provider says that it is safe. This information is not intended to replace advice given to you by your health care provider. Make sure you discuss any questions you have with your health care provider. Document Revised: 07/27/2017 Document Reviewed: 07/27/2017 Elsevier Patient Education  2020 [place] have an appointment set up with the [facility].  Multiple studies have shown that being followed by a dedicated atrial fibrillation clinic in addition  to the standard care you receive from your other physicians improves health. We believe that enrollment in the atrial fibrillation clinic will allow [place] to better care for you.   The phone number to the [facility] is [phone]. The clinic is staffed Monday through Friday from 8:30am to 5pm.  Directions: The clinic is located in the [facility], [place] the hospital at the MAIN  ENTRANCE "A", use [address] to the 6th floor.  Registration desk to the right of elevators on 6th floor  If you have any trouble locating the clinic, please don't hesitate to call [phone].

## 2022-09-05 NOTE — Anesthesia Procedure Notes (Signed)
Procedure Name: Intubation Date/Time: 09/05/2022 7:48 AM  Performed by: Heide Scales, CRNAPre-anesthesia Checklist: Patient identified, Emergency Drugs available, Suction available and Patient being monitored Patient Re-evaluated:Patient Re-evaluated prior to induction Oxygen Delivery Method: Circle system utilized Preoxygenation: Pre-oxygenation with 100% oxygen Induction Type: IV induction Ventilation: Mask ventilation without difficulty Laryngoscope Size: Mac and 4 Grade View: Grade I Tube type: Oral Tube size: 7.0 mm Number of attempts: 1 Airway Equipment and Method: Stylet and Oral airway Placement Confirmation: ETT inserted through vocal cords under direct vision, positive ETCO2 and breath sounds checked- equal and bilateral Secured at: 23 cm Tube secured with: Tape Dental Injury: Teeth and Oropharynx as per pre-operative assessment

## 2022-09-05 NOTE — H&P (Signed)
Electrophysiology Office Note:    Date:  09/05/2022   ID:  Samantha Terrell, DOB 01/07/1961, MRN [id number]  PCP:  Liane Comber, NP   [place] Providers Cardiologist:  None Electrophysiologist:  Melida Quitter, MD     Referring MD: No ref. provider found   History of Present Illness:    Samantha Terrell is a 62 y.o. female with a hx as listed below significant for atrial fibrillation symptomatic PVCs referred to discuss arrhythmia management.  She wore an event monitor in 2016 for palpitations that just showed occasional to be PVCs that correlated with her symptom of skipped beats. Structural heart disease was excluded, and it appears she was lost to follow-up.  She presented to her PCP in August complaining of palpitations and fatigue.  ECG from August 30 showed atrial fibrillation.  She was referred to Dr. Gardiner Rhyme who started Eliquis and metoprolol and placed a Zio patch which showed an atrial fibrillation burden of 21% with, on average, controlled rates.  Coronary CT ordered to evaluate dyspnea showed no evidence of CAD.  09/05/2022 I reviewed the patient's CT and labs. There was no LAA thrombus. she  has not missed any doses of anticoagulation, and she took her dose last night. Since her clinic visit with me, Losartan was added. She has had nasal congestion and drainage over the past few days. She has not had fever, cough, or shortness of breath. She reports that she tested negative for COVID.    Past Medical History:  Diagnosis Date   Atrial fibrillation ([place])    Degenerative disc disease, cervical    Depression    Family history of adverse reaction to anesthesia    mother-- ponv   GAD (generalized anxiety disorder)    History of adenomatous polyp of colon    History of COVID-19 11/2020   per pt mild symptoms that resolved   Hypertension    PVC (premature ventricular contraction)    symptomatic intermittant palpitations  (event monitor in epic 12-03-2014  showed SR w/ PVCs)   SUI (stress urinary incontinence, female)     Past Surgical History:  Procedure Laterality Date   BLADDER SUSPENSION N/A 11/06/2021   Procedure: TRANSVAGINAL TAPE (TVT) PROCEDURE;  Surgeon: Bobbye Charleston, MD;  Location: [facility];  Service: Gynecology;  Laterality: N/A;   COLONOSCOPY WITH PROPOFOL  06/07/2021   by Dr.Beavers   CYSTOCELE REPAIR N/A 11/06/2021   Procedure: ANTERIOR REPAIR (CYSTOCELE);  Surgeon: Bobbye Charleston, MD;  Location: [facility];  Service: Gynecology;  Laterality: N/A;   CYSTOSCOPY N/A 11/06/2021   Procedure: CYSTOSCOPY;  Surgeon: Bobbye Charleston, MD;  Location: [facility];  Service: Gynecology;  Laterality: N/A;   NASAL SEPTUM SURGERY  1986    Current Medications: Current Meds  Medication Sig   apixaban (ELIQUIS) 5 MG TABS tablet Take 1 tablet (5 mg total) by mouth 2 (two) times daily.   losartan (COZAAR) 25 MG tablet Take 2 tablets (50 mg total) by mouth daily.   Methylcobalamin (B12 SL) Place 1,000 mcg under the tongue daily.   metoprolol succinate (TOPROL XL) 25 MG 24 hr tablet Take 2 tablets (50 mg total) by mouth daily. Take extra tablet for racing heart rate, more than 100 bpm. (Patient taking differently: Take 25 mg by mouth in the morning and at bedtime. Take extra tablet for racing heart rate, more than 100 bpm.)   norethindrone-ethinyl estradiol (FEMHRT 1/5) 1-5 MG-MCG TABS tablet Take 1 tablet  by mouth at bedtime.   Omega-3 Fatty Acids (FISH OIL) 1000 MG CPDR Take 1,000 mg by mouth daily.   OVER THE COUNTER MEDICATION Take 5,000 mcg by mouth 2 (two) times a day. DHT blocker   sertraline (ZOLOFT) 50 MG tablet Take 50 mg by mouth daily with lunch.   Turmeric 450 MG CAPS Take 450 mg by mouth daily.   [DISCONTINUED] losartan (COZAAR) 25 MG tablet Take 1 tablet (25 mg total) by mouth daily.     Allergies:   Latex   Social History   Socioeconomic History   Marital status:  Married    Spouse name: Not on file   Number of children: Not on file   Years of education: Not on file   Highest education level: Not on file  Occupational History   Not on file  Tobacco Use   Smoking status: Never   Smokeless tobacco: Never  Vaping Use   Vaping Use: Never used  Substance and Sexual Activity   Alcohol use: Yes    Alcohol/week: 6.0 standard drinks of alcohol    Types: 6 Glasses of wine per week   Drug use: Never   Sexual activity: Yes    Partners: Male    Birth control/protection: Post-menopausal  Other Topics Concern   Not on file  Social History Narrative   Not on file   Social Determinants of Health   Financial Resource Strain: Not on file  Food Insecurity: Not on file  Transportation Needs: Not on file  Physical Activity: Sufficiently Active (12/03/2017)   Exercise Vital Sign    Days of Exercise per Week: 4 days    Minutes of Exercise per Session: 50 min  Stress: No Stress Concern Present (12/03/2017)   [place]    Feeling of Stress : Only a little  Social Connections: Not on file     Family History: The patient's family history includes Arthritis in her maternal grandfather and mother; Cancer in her maternal grandmother; Congestive Heart Failure in her maternal grandmother; Heart attack in her maternal grandmother; Hyperlipidemia in her father; Osteoporosis in her maternal grandmother and paternal grandmother; Vascular Disease in her maternal grandfather. There is no history of Colon cancer, Breast cancer, Colon polyps, Esophageal cancer, Rectal cancer, or Stomach cancer.  ROS:   Please see the history of present illness.    All other systems reviewed and are negative.  EKGs/Labs/Other Studies Reviewed Today:     7 day monitor: Sinus rhythm heart rate 49 to 120 bpm.  A-fib recurred 21% of the time (49-227 bpm, average 77 bpm). One patient-triggered event was associated with sinus  rhythm  Coronary CTA 04/2022 CADRAD 0  EKG:  Last EKG results: today - AF with RVR   Recent Labs: 03/26/2022: ALT 19; Magnesium 1.9; TSH 1.47 08/21/2022: BUN 10; Creatinine, Ser 0.74; Potassium 4.6; Sodium 139 09/05/2022: Hemoglobin 14.8; Platelets 230     Physical Exam:    VS:  BP 124/66   Pulse 92   Temp 98.2 F (36.8 C) (Oral)   Resp 18   Ht 5' 7.5" (1.715 m)   Wt 87.5 kg   LMP  (LMP Unknown)   SpO2 99%   BMI 29.78 kg/m     Wt Readings from Last 3 Encounters:  09/05/22 87.5 kg  08/25/22 85.3 kg  08/14/22 88.3 kg     GEN: Well nourished, well developed in no acute distress CARDIAC: Irregular rhythm, no murmurs, rubs, gallops  RESPIRATORY:  Normal work of breathing MUSCULOSKELETAL: no edema    ASSESSMENT & PLAN:    Atrial fibrillation: paroxysmal, symptomatic. Planning to proceed with ablation today. Hypertension: BP controlled today        Medication Adjustments/Labs and Tests Ordered: Current medicines are reviewed at length with the patient today.  Concerns regarding medicines are outlined above.  Orders Placed This Encounter  Procedures   CBC   Informed Consent Details: Physician/Practitioner Attestation; Transcribe to consent form and obtain patient signature   Initiate Pre-op Protocol   Void on call to EP Lab   Confirm CBC and BMP (or CMP) results within 7 days for inpatient and 30 days for outpatient:   Clip right and left femoral area PM before surgery   Clip right internal jugular area PM before surgery   Pre-admission testing diagnosis   EP STUDY   Insert peripheral IV   Meds ordered this encounter  Medications   0.9 %  sodium chloride infusion     Signed, Melida Quitter, MD  09/05/2022 7:25 AM    [place]

## 2022-09-05 NOTE — Anesthesia Postprocedure Evaluation (Signed)
Anesthesia Post Note  Patient: Samantha Terrell  Procedure(s) Performed: ATRIAL FIBRILLATION ABLATION     Patient location during evaluation: PACU Anesthesia Type: General Level of consciousness: awake and alert Pain management: pain level controlled Vital Signs Assessment: post-procedure vital signs reviewed and stable Respiratory status: spontaneous breathing, nonlabored ventilation and respiratory function stable Cardiovascular status: blood pressure returned to baseline and stable Postop Assessment: no apparent nausea or vomiting Anesthetic complications: no   There were no known notable events for this encounter.  Last Vitals:  Vitals:   09/05/22 1045 09/05/22 1115  BP: (!) 112/54 (!) 105/59  Pulse: 84 84  Resp: 19 17  Temp:    SpO2: 96% 94%    Last Pain:  Vitals:   09/05/22 1115  TempSrc:   PainSc: [place]

## 2022-09-05 NOTE — Transfer of Care (Signed)
Immediate Anesthesia Transfer of Care Note  Patient: Samantha Terrell  Procedure(s) Performed: ATRIAL FIBRILLATION ABLATION  Patient Location: Cath Lab  Anesthesia Type:General  Level of Consciousness: awake, alert , and oriented  Airway & Oxygen Therapy: Patient Spontanous Breathing and Patient connected to nasal cannula oxygen  Post-op Assessment: Report given to RN, Post -op Vital signs reviewed and stable, and Patient moving all extremities X 4  Post vital signs: Reviewed and stable  Last Vitals:  Vitals Value Taken Time  BP 108/48 09/05/22 0950  Temp    Pulse 93 09/05/22 0952  Resp 20 09/05/22 0952  SpO2 96 % 09/05/22 0952  Vitals shown include unvalidated device data.  Last Pain:  Vitals:   09/05/22 0552  TempSrc: Oral  PainSc:          Complications: There were no known notable events for this encounter.

## 2022-09-09 ENCOUNTER — Other Ambulatory Visit: Payer: Self-pay

## 2022-09-09 DIAGNOSIS — G4733 Obstructive sleep apnea (adult) (pediatric): Secondary | ICD-10-CM

## 2022-09-20 ENCOUNTER — Other Ambulatory Visit: Payer: Self-pay | Admitting: Internal Medicine

## 2022-09-20 DIAGNOSIS — I493 Ventricular premature depolarization: Secondary | ICD-10-CM

## 2022-10-03 ENCOUNTER — Encounter (HOSPITAL_COMMUNITY): Payer: Self-pay | Admitting: Physician Assistant

## 2022-10-03 ENCOUNTER — Ambulatory Visit (HOSPITAL_COMMUNITY)
Admission: RE | Admit: 2022-10-03 | Discharge: 2022-10-03 | Disposition: A | Discharge status: Home / Self Care | Payer: Commercial Managed Care - PPO | Source: Ambulatory Visit | Attending: Physician Assistant | Admitting: Physician Assistant

## 2022-10-03 VITALS — BP 146/100 | HR 77 | Ht 67.5 in | Wt 193.4 lb

## 2022-10-03 DIAGNOSIS — G4733 Obstructive sleep apnea (adult) (pediatric): Secondary | ICD-10-CM | POA: Diagnosis not present

## 2022-10-03 DIAGNOSIS — Z7901 Long term (current) use of anticoagulants: Secondary | ICD-10-CM | POA: Diagnosis not present

## 2022-10-03 DIAGNOSIS — Z79899 Other long term (current) drug therapy: Secondary | ICD-10-CM | POA: Diagnosis not present

## 2022-10-03 DIAGNOSIS — I10 Essential (primary) hypertension: Secondary | ICD-10-CM | POA: Diagnosis not present

## 2022-10-03 DIAGNOSIS — I480 Paroxysmal atrial fibrillation: Secondary | ICD-10-CM | POA: Insufficient documentation

## 2022-10-03 DIAGNOSIS — I493 Ventricular premature depolarization: Secondary | ICD-10-CM | POA: Diagnosis not present

## 2022-10-03 MED ORDER — METOPROLOL SUCCINATE ER 25 MG PO TB24: 25.0000 mg | ORAL_TABLET | Freq: Two times a day (BID) | ORAL | 1 refills | Status: DC

## 2022-10-03 NOTE — Progress Notes (Signed)
Primary Care Physician: Liane Comber, NP Primary Cardiologist: Dr Gardiner Rhyme  Primary Electrophysiologist: Dr Myles Gip  Referring Physician: Dr Suann Larry Samantha Terrell is a 62 y.o. female with a history of OSA, HLD, HTN, atrial fibrillation who presents for follow up in the [facility]. She presented to her PCP 02/2022 complaining of palpitations and fatigue.  ECG from 03/26/22 showed atrial fibrillation.  She was referred to Dr. Gardiner Rhyme who started Eliquis and metoprolol and placed a Zio patch which showed an atrial fibrillation burden of 21% with, on average, controlled rates.  Coronary CT ordered to evaluate dyspnea showed no evidence of CAD.  Patient is on Eliquis for a CHADS2VASC score of 2. She was seen by Dr Myles Gip and underwent afib ablation on 09/05/22.  On follow up today, patient reports that she has done well since her ablation. She has had some palpitations but these have been less frequent and less symptomatic since the ablation. She denies chest pain, swallowing pain, or groin issues.   Today, she denies symptoms of chest pain, shortness of breath, orthopnea, PND, lower extremity edema, dizziness, presyncope, syncope, snoring, daytime somnolence, bleeding, or neurologic sequela. The patient is tolerating medications without difficulties and is otherwise without complaint today.    Atrial Fibrillation Risk Factors:  she does have symptoms or diagnosis of sleep apnea. she is not on CPAP therapy. she does not have a history of rheumatic fever.   she has a BMI of Body mass index is 29.84 kg/m.Marland Kitchen Filed Weights   10/03/22 1020  Weight: 87.7 kg    Family History  Problem Relation Age of Onset   Arthritis Mother        bilateral knees   Hyperlipidemia Father    Cancer Maternal Grandmother        cervical cancer   Osteoporosis Maternal Grandmother    Heart attack Maternal Grandmother        age 8   Congestive Heart Failure Maternal  Grandmother        age 73   Arthritis Maternal Grandfather        Severe, bilateral hands   Vascular Disease Maternal Grandfather        smoker, PAD   Osteoporosis Paternal Grandmother    Colon cancer Neg Hx    Breast cancer Neg Hx    Colon polyps Neg Hx    Esophageal cancer Neg Hx    Rectal cancer Neg Hx    Stomach cancer Neg Hx      Atrial Fibrillation Management history:  Previous antiarrhythmic drugs: none Previous cardioversions: none Previous ablations: 09/05/22 CHADS2VASC score: 2 Anticoagulation history: Eliquis   Past Medical History:  Diagnosis Date   Atrial fibrillation ([facility])    Degenerative disc disease, cervical    Depression    Family history of adverse reaction to anesthesia    mother-- ponv   GAD (generalized anxiety disorder)    History of adenomatous polyp of colon    History of COVID-19 11/2020   per pt mild symptoms that resolved   Hypertension    PVC (premature ventricular contraction)    symptomatic intermittant palpitations  (event monitor in epic 12-03-2014 showed SR w/ PVCs)   SUI (stress urinary incontinence, female)    Past Surgical History:  Procedure Laterality Date   ATRIAL FIBRILLATION ABLATION N/A 09/05/2022   Procedure: ATRIAL FIBRILLATION ABLATION;  Surgeon: Melida Quitter, MD;  Location: [facility];  Service: Cardiovascular;  Laterality: N/A;  BLADDER SUSPENSION N/A 11/06/2021   Procedure: TRANSVAGINAL TAPE (TVT) PROCEDURE;  Surgeon: Bobbye Charleston, MD;  Location: [facility];  Service: Gynecology;  Laterality: N/A;   COLONOSCOPY WITH PROPOFOL  06/07/2021   by Dr.Beavers   CYSTOCELE REPAIR N/A 11/06/2021   Procedure: ANTERIOR REPAIR (CYSTOCELE);  Surgeon: Bobbye Charleston, MD;  Location: [facility];  Service: Gynecology;  Laterality: N/A;   CYSTOSCOPY N/A 11/06/2021   Procedure: CYSTOSCOPY;  Surgeon: Bobbye Charleston, MD;  Location[facility];  Service: Gynecology;   Laterality: N/A;   NASAL SEPTUM SURGERY  1986    Current Outpatient Medications  Medication Sig Dispense Refill   apixaban (ELIQUIS) 5 MG TABS tablet Take 1 tablet (5 mg total) by mouth 2 (two) times daily. 180 tablet 2   losartan (COZAAR) 25 MG tablet Take 2 tablets (50 mg total) by mouth daily. 90 tablet 3   Methylcobalamin (B12 SL) Place 1,000 mcg under the tongue daily.     metoprolol succinate (TOPROL-XL) 25 MG 24 hr tablet TAKE 1-2 TAB DAILY FOR PALPITATIONS AND BLOOD PRESSURE. 180 tablet 1   norethindrone-ethinyl estradiol (FEMHRT 1/5) 1-5 MG-MCG TABS tablet Take 1 tablet by mouth at bedtime.     Omega-3 Fatty Acids (FISH OIL) 1000 MG CPDR Take 1,000 mg by mouth daily.     OVER THE COUNTER MEDICATION Take 5,000 mcg by mouth 2 (two) times a day. DHT blocker     pantoprazole (PROTONIX) 40 MG tablet Take 1 tablet (40 mg total) by mouth daily. 30 tablet 0   sertraline (ZOLOFT) 50 MG tablet Take 50 mg by mouth daily with lunch.     Turmeric 450 MG CAPS Take 450 mg by mouth daily.     Vitamin D, Cholecalciferol, 1000 UNITS CAPS Take by mouth at bedtime.     No current facility-administered medications for this encounter.    Allergies  Allergen Reactions   Latex Itching    Social History   Socioeconomic History   Marital status: Married    Spouse name: Not on file   Number of children: Not on file   Years of education: Not on file   Highest education level: Not on file  Occupational History   Not on file  Tobacco Use   Smoking status: Never   Smokeless tobacco: Never   Tobacco comments:    Never smoke 10/03/22  Vaping Use   Vaping Use: Never used  Substance and Sexual Activity   Alcohol use: Yes    Alcohol/week: 4.0 - 5.0 standard drinks of alcohol    Types: 4 - 5 Glasses of wine per week    Comment: 4-5 glasses of wine weekly 10/03/22   Drug use: Never   Sexual activity: Yes    Partners: Male    Birth control/protection: Post-menopausal  Other Topics Concern    Not on file  Social History Narrative   Not on file   Social Determinants of Health   Financial Resource Strain: Not on file  Food Insecurity: Not on file  Transportation Needs: Not on file  Physical Activity: Sufficiently Active (12/03/2017)   Exercise Vital Sign    Days of Exercise per Week: 4 days    Minutes of Exercise per Session: 50 min  Stress: No Stress Concern Present (12/03/2017)   [place]    Feeling of Stress : Only a little  Social Connections: Not on file  Intimate Partner Violence: Not on file  ROS- All systems are reviewed and negative except as per the HPI above.  Physical Exam: Vitals:   10/03/22 1020  BP: (!) 146/100  Pulse: 77  Weight: 87.7 kg  Height: 5' 7.5" (1.715 m)    GEN- The patient is a well appearing female, alert and oriented x 3 today.   Head- normocephalic, atraumatic Eyes-  Sclera clear, conjunctiva pink Ears- hearing intact Oropharynx- clear Neck- supple  Lungs- Clear to ausculation bilaterally, normal work of breathing Heart- Regular rate and rhythm, no murmurs, rubs or gallops  GI- soft, NT, ND, + BS Extremities- no clubbing, cyanosis, or edema MS- no significant deformity or atrophy Skin- no rash or lesion Psych- euthymic mood, full affect Neuro- strength and sensation are intact  Wt Readings from Last 3 Encounters:  10/03/22 87.7 kg  09/05/22 87.5 kg  08/25/22 85.3 kg    EKG today demonstrates  SR Vent. rate 77 BPM PR interval 134 ms QRS duration 80 ms QT/QTcB 372/420 ms  Echo 05/05/22 demonstrated   1. Left ventricular ejection fraction, by estimation, is 60 to 65%. The  left ventricle has normal function. The left ventricle has no regional  wall motion abnormalities. There is mild concentric left ventricular  hypertrophy. Left ventricular diastolic parameters are indeterminate.   2. Right ventricular systolic function is normal. The right  ventricular  size is normal. There is normal pulmonary artery systolic pressure.   3. The mitral valve is normal in structure. No evidence of mitral valve  regurgitation. No evidence of mitral stenosis.   4. The aortic valve is normal in structure. Aortic valve regurgitation is  mild. No aortic stenosis is present.   5. The inferior vena cava is normal in size with greater than 50%  respiratory variability, suggesting right atrial pressure of 3 mmHg.   Epic records are reviewed at length today  CHA2DS2-VASc Score = 2  The patient's score is based upon: CHF History: 0 HTN History: 1 Diabetes History: 0 Stroke History: 0 Vascular Disease History: 0 Age Score: 0 Gender Score: 1       ASSESSMENT AND PLAN: 1. Paroxysmal Atrial Fibrillation (ICD10:  I48.0) The patient's CHA2DS2-VASc score is 2, indicating a 2.2% annual risk of stroke.  S/p afib ablation 09/05/22 Patient's palpitation are improving.  Continue Toprol 25 mg BID Continue Eliquis 5 mg BID with no missed doses for 3 months post ablation.   2. HTN Elevated today, has been better controlled at previous visits and at home.  No changes today.   3. Obstructive sleep apnea Intolerant of mask Followed by Dr Radford Pax, referred for [facility].    Follow up with Dr Myles Gip as scheduled.    [facility] [address] [place], [place] [address] [phone] 10/03/2022 10:28 AM

## 2022-10-06 ENCOUNTER — Encounter: Payer: Self-pay | Admitting: Cardiology

## 2022-10-07 ENCOUNTER — Other Ambulatory Visit: Payer: Self-pay

## 2022-10-07 MED ORDER — LOSARTAN POTASSIUM 25 MG PO TABS: 50.0000 mg | ORAL_TABLET | Freq: Every day | ORAL | 3 refills | Status: DC

## 2022-10-07 MED ORDER — LOSARTAN POTASSIUM 50 MG PO TABS: 50.0000 mg | ORAL_TABLET | Freq: Every day | ORAL | 3 refills | Status: DC

## 2022-12-05 ENCOUNTER — Ambulatory Visit: Payer: Commercial Managed Care - PPO | Attending: Cardiovascular Disease | Admitting: Cardiovascular Disease

## 2022-12-05 ENCOUNTER — Encounter: Payer: Self-pay | Admitting: Cardiovascular Disease

## 2022-12-05 VITALS — BP 138/84 | HR 67 | Ht 67.5 in | Wt 190.6 lb

## 2022-12-05 DIAGNOSIS — D6869 Other thrombophilia: Secondary | ICD-10-CM

## 2022-12-05 DIAGNOSIS — I480 Paroxysmal atrial fibrillation: Secondary | ICD-10-CM

## 2022-12-05 NOTE — Patient Instructions (Signed)
Medication Instructions:  Your physician recommends that you continue on your current medications as directed. Please refer to the Current Medication list given to you today. *If you need a refill on your cardiac medications before your next appointment, please call your pharmacy*   Follow-Up: At [facility], you and your health needs are our priority.  As part of our continuing mission to provide you with exceptional heart care, we have created designated Provider Care Teams.  These Care Teams include your primary Cardiologist (physician) and Advanced Practice Providers (APPs -  Physician Assistants and Nurse Practitioners) who all work together to provide you with the care you need, when you need it.  We recommend signing up for the patient portal called "MyChart".  Sign up information is provided on this After Visit Summary.  MyChart is used to connect with patients for Virtual Visits (Telemedicine).  Patients are able to view lab/test results, encounter notes, upcoming appointments, etc.  Non-urgent messages can be sent to your provider as well.   To learn more about what you can do with MyChart, go to [email or web address].    Your next appointment:   6 month(s)  Provider:   You will follow up in the Atrial Fibrillation Clinic located at [facility]. Your provider will be: Donna Carroll, NP or Clint R. Fenton, PA-C  

## 2022-12-05 NOTE — Progress Notes (Signed)
Electrophysiology Office Note:    Date:  12/05/2022   ID:  Samantha Terrell, Samantha Terrell 10/24/1960, MRN [id number]  PCP:  Judd Gaudier, NP   [facility] Providers Cardiologist:  None Electrophysiologist:  Maurice Small, MD     Referring MD: Judd Gaudier, NP   History of Present Illness:    Samantha Terrell is a 62 y.o. female with a hx as listed below significant for atrial fibrillation symptomatic PVCs referred to discuss arrhythmia management.  She wore an event monitor in 2016 for palpitations that just showed occasional to be PVCs that correlated with her symptom of skipped beats. Structural heart disease was excluded, and it appears she was lost to follow-up.  She presented to her PCP in August complaining of palpitations and fatigue.  ECG from August 30 showed atrial fibrillation.  She was referred to Dr. Bjorn Pippin who started Eliquis and metoprolol and placed a Zio patch which showed an atrial fibrillation burden of 21% with, on average, controlled rates.  Coronary CT ordered to evaluate dyspnea showed no evidence of CAD.  Today, she reports that she is doing very well.  She had an A-fib ablation in February.  She has not had symptoms of recurrence  Past Medical History:  Diagnosis Date   Atrial fibrillation (HCC)    Degenerative disc disease, cervical    Depression    Family history of adverse reaction to anesthesia    mother-- ponv   GAD (generalized anxiety disorder)    History of adenomatous polyp of colon    History of COVID-19 11/2020   per pt mild symptoms that resolved   Hypertension    PVC (premature ventricular contraction)    symptomatic intermittant palpitations  (event monitor in epic 12-03-2014 showed SR w/ PVCs)   SUI (stress urinary incontinence, female)     Past Surgical History:  Procedure Laterality Date   ATRIAL FIBRILLATION ABLATION N/A 09/05/2022   Procedure: ATRIAL FIBRILLATION ABLATION;  Surgeon: Maurice Small, MD;  [facility];  Service: Cardiovascular;  Laterality: N/A;   BLADDER SUSPENSION N/A 11/06/2021   Procedure: TRANSVAGINAL TAPE (TVT) PROCEDURE;  Surgeon: Carrington Clamp, MD;  Location: [facility];  Service: Gynecology;  Laterality: N/A;   COLONOSCOPY WITH PROPOFOL  06/07/2021   by Dr.Beavers   CYSTOCELE REPAIR N/A 11/06/2021   Procedure: ANTERIOR REPAIR (CYSTOCELE);  Surgeon: Carrington Clamp, MD;  [facility]  Service: Gynecology;  Laterality: N/A;   CYSTOSCOPY N/A 11/06/2021   Procedure: CYSTOSCOPY;  Surgeon: Carrington Clamp, MD;  Location: [facility];  Service: Gynecology;  Laterality: N/A;   NASAL SEPTUM SURGERY  1986    Current Medications: Current Meds  Medication Sig   apixaban (ELIQUIS) 5 MG TABS tablet Take 1 tablet (5 mg total) by mouth 2 (two) times daily.   losartan (COZAAR) 50 MG tablet Take 1 tablet (50 mg total) by mouth daily.   Methylcobalamin (B12 SL) Place 1,000 mcg under the tongue daily.   metoprolol succinate (TOPROL-XL) 25 MG 24 hr tablet Take 1 tablet (25 mg total) by mouth 2 (two) times daily.   norethindrone-ethinyl estradiol (FEMHRT 1/5) 1-5 MG-MCG TABS tablet Take 1 tablet by mouth at bedtime.   Omega-3 Fatty Acids (FISH OIL) 1000 MG CPDR Take 1,000 mg by mouth daily.   OVER THE COUNTER MEDICATION Take 5,000 mcg by mouth 2 (two) times a day. DHT blocker   sertraline (ZOLOFT) 50 MG tablet Take 50 mg by  mouth daily with lunch.   Turmeric 450 MG CAPS Take 450 mg by mouth daily.   Vitamin D, Cholecalciferol, 1000 UNITS CAPS Take by mouth at bedtime.     Allergies:   Latex   Social History   Socioeconomic History   Marital status: Married    Spouse name: Not on file   Number of children: Not on file   Years of education: Not on file   Highest education level: Not on file  Occupational History   Not on file  Tobacco Use   Smoking status: Never   Smokeless tobacco: Never   Tobacco comments:     Never smoke 10/03/22  Vaping Use   Vaping Use: Never used  Substance and Sexual Activity   Alcohol use: Yes    Alcohol/week: 4.0 - 5.0 standard drinks of alcohol    Types: 4 - 5 Glasses of wine per week    Comment: 4-5 glasses of wine weekly 10/03/22   Drug use: Never   Sexual activity: Yes    Partners: Male    Birth control/protection: Post-menopausal  Other Topics Concern   Not on file  Social History Narrative   Not on file   Social Determinants of Health   Financial Resource Strain: Not on file  Food Insecurity: Not on file  Transportation Needs: Not on file  Physical Activity: Sufficiently Active (12/03/2017)   Exercise Vital Sign    Days of Exercise per Week: 4 days    Minutes of Exercise per Session: 50 min  Stress: No Stress Concern Present (12/03/2017)   [employer] of Occupational Health - Occupational Stress Questionnaire    Feeling of Stress : Only a little  Social Connections: Not on file     Family History: The patient's family history includes Arthritis in her maternal grandfather and mother; Cancer in her maternal grandmother; Congestive Heart Failure in her maternal grandmother; Heart attack in her maternal grandmother; Hyperlipidemia in her father; Osteoporosis in her maternal grandmother and paternal grandmother; Vascular Disease in her maternal grandfather. There is no history of Colon cancer, Breast cancer, Colon polyps, Esophageal cancer, Rectal cancer, or Stomach cancer.  ROS:   Please see the history of present illness.    All other systems reviewed and are negative.  EKGs/Labs/Other Studies Reviewed Today:     7 day monitor: Sinus rhythm heart rate 49 to 120 bpm.  A-fib recurred 21% of the time (49-227 bpm, average 77 bpm). One patient-triggered event was associated with sinus rhythm  Coronary CTA 04/2022 CADRAD 0  EKG:  Last EKG results: today -sinus rhythm   Recent Labs: 03/26/2022: ALT 19; Magnesium 1.9; TSH 1.47 08/21/2022: BUN 10;  Creatinine, Ser 0.74; Potassium 4.6; Sodium 139 09/05/2022: Hemoglobin 14.8; Platelets 230     Physical Exam:    VS:  BP 138/84   Pulse (!) 7   Ht 5' 7.5" (1.715 m)   Wt 190 lb 9.6 oz (86.5 kg)   LMP  (LMP Unknown)   SpO2 99%   BMI 29.41 kg/m     Pulse 67  Wt Readings from Last 3 Encounters:  12/05/22 190 lb 9.6 oz (86.5 kg)  10/03/22 193 lb 6.4 oz (87.7 kg)  09/05/22 193 lb (87.5 kg)     GEN: Well nourished, well developed in no acute distress CARDIAC: Irregular rhythm, no murmurs, rubs, gallops RESPIRATORY:  Normal work of breathing MUSCULOSKELETAL: no edema    ASSESSMENT & PLAN:    Atrial fibrillation:  paroxysmal, symptomatic.  Status  post ablation on October 04, 2022 Maintaining sinus rhythm Continue metoprolol XL 25  Secondary hypercoagulable state Continue apixaban 5 mg   Hypertension: BP controlled today        Medication Adjustments/Labs and Tests Ordered: Current medicines are reviewed at length with the patient today.  Concerns regarding medicines are outlined above.  No orders of the defined types were placed in this encounter.  No orders of the defined types were placed in this encounter.    Signed, Maurice Small, MD  12/05/2022 12:13 PM    [facility]

## 2022-12-13 ENCOUNTER — Encounter: Payer: Self-pay | Admitting: Nurse Practitioner

## 2022-12-14 MED ORDER — CICLOPIROX OLAMINE 0.77 % EX CREA: TOPICAL_CREAM | Freq: Two times a day (BID) | CUTANEOUS | 0 refills | Status: DC

## 2022-12-29 ENCOUNTER — Other Ambulatory Visit: Payer: Self-pay | Admitting: Otolaryngology

## 2023-01-05 ENCOUNTER — Encounter (HOSPITAL_COMMUNITY): Payer: Self-pay | Admitting: Otolaryngology

## 2023-01-05 NOTE — Pre-Procedure Instructions (Signed)
PCP - Judd Gaudier, NP Cardiologist - Maurice Small, MD  EKG - 12/05/22 ECHO - 05/05/22  CPAP - Cannot tolerate  Blood Thinner Instructions: Continue taking Eliquis per note from [place] Everywhere  Anesthesia review: Y  Patient verbally denies any shortness of breath, fever, cough and chest pain during phone call   -------------  SDW INSTRUCTIONS given:  Your procedure is scheduled on Tuesday, June 11th.  Report to [facility] Main Entrance "A" at 0530 A.M., and check in at the Admitting office.  Call this number if you have problems the morning of surgery:  [phone]   Remember:  Do not eat after midnight the night before your surgery  You may drink clear liquids until 0400 the morning of your surgery.   Clear liquids allowed are: Water, Non-Citrus Juices (without pulp), Carbonated Beverages, Clear Tea, Black Coffee Only, and Gatorade    Take these medicines the morning of surgery with A SIP OF WATER  metoprolol succinate (TOPROL-XL)    As of today, STOP taking any Aspirin (unless otherwise instructed by your surgeon) Aleve, Naproxen, Ibuprofen, Motrin, Advil, Goody's, BC's, all herbal medications, fish oil, and all vitamins.                      Do not wear jewelry, make up, or nail polish            Do not wear lotions, powders, perfumes/colognes, or deodorant.            Do not shave 48 hours prior to surgery.  Men may shave face and neck.            Do not bring valuables to the hospital.            [facility] is not responsible for any belongings or valuables.  Do NOT Smoke (Tobacco/Vaping) 24 hours prior to your procedure If you use a CPAP at night, you may bring all equipment for your overnight stay.   Contacts, glasses, dentures or bridgework may not be worn into surgery.      For patients admitted to the hospital, discharge time will be determined by your treatment team.   Patients discharged the day of surgery will not be allowed to drive  home, and someone needs to stay with them for 24 hours.    Special instructions:   [place]- Preparing For Surgery  Before surgery, you can play an important role. Because skin is not sterile, your skin needs to be as free of germs as possible. You can reduce the number of germs on your skin by washing with CHG (chlorahexidine gluconate) Soap before surgery.  CHG is an antiseptic cleaner which kills germs and bonds with the skin to continue killing germs even after washing.    Oral Hygiene is also important to reduce your risk of infection.  Remember - BRUSH YOUR TEETH THE MORNING OF SURGERY WITH YOUR REGULAR TOOTHPASTE  Please do not use if you have an allergy to CHG or antibacterial soaps. If your skin becomes reddened/irritated stop using the CHG.  Do not shave (including legs and underarms) for at least 48 hours prior to first CHG shower. It is OK to shave your face.  Please follow these instructions carefully.   Shower the NIGHT BEFORE SURGERY and the MORNING OF SURGERY with DIAL Soap.   Pat yourself dry with a CLEAN TOWEL.  Wear CLEAN PAJAMAS to bed the night before surgery  Place CLEAN SHEETS  on your bed the night of your first shower and DO NOT SLEEP WITH PETS.   Day of Surgery: Please shower morning of surgery  Wear Clean/Comfortable clothing the morning of surgery Do not apply any deodorants/lotions.   Remember to brush your teeth WITH YOUR REGULAR TOOTHPASTE.   Questions were answered. Patient verbalized understanding of instructions.

## 2023-01-05 NOTE — Progress Notes (Signed)
Anesthesia Chart Review: Same day workup  Follows with EP cardiology for history of PVCs and A-fib status post recent ablation February 2024.  Last seen by Dr. Nelly Laurence 12/05/2022 and noted to be maintaining sinus rhythm.  She was continued on Eliquis and metoprolol.  Patient will need day of surgery labs and evaluation.  EKG 12/05/2022: NSR.  Rate 67.  Coronary CTA 05/15/2022: IMPRESSION: 1. No evidence of CAD, CADRADS = 0.   2. Coronary calcium score of 0. This was 0 percentile for age and sex matched control.   3. Normal coronary origin with right dominance.   4. Normal left atrial appendage with a probable mixing defect in the apex, thrombus is less likely.   5. Consider non-coronary causes of chest pain.  TTE 05/05/2022:  1. Left ventricular ejection fraction, by estimation, is 60 to 65%. The  left ventricle has normal function. The left ventricle has no regional  wall motion abnormalities. There is mild concentric left ventricular  hypertrophy. Left ventricular diastolic  parameters are indeterminate.   2. Right ventricular systolic function is normal. The right ventricular  size is normal. There is normal pulmonary artery systolic pressure.   3. The mitral valve is normal in structure. No evidence of mitral valve  regurgitation. No evidence of mitral stenosis.   4. The aortic valve is normal in structure. Aortic valve regurgitation is  mild. No aortic stenosis is present.   5. The inferior vena cava is normal in size with greater than 50%  respiratory variability, suggesting right atrial pressure of 3 mmHg.     Zannie Cove [facility] Short Stay Center/Anesthesiology Phone [phone] 01/05/2023 1:31 PM

## 2023-01-05 NOTE — Anesthesia Preprocedure Evaluation (Signed)
Anesthesia Evaluation  Patient identified by MRN, date of birth, ID band Patient awake    Reviewed: Allergy & Precautions, NPO status , Patient's Chart, lab work & pertinent test results  Airway Mallampati: III  TM Distance: >3 FB Neck ROM: Full    Dental  (+) Dental Advisory Given   Pulmonary sleep apnea    breath sounds clear to auscultation       Cardiovascular hypertension, Pt. on medications and Pt. on home beta blockers + dysrhythmias Atrial Fibrillation  Rhythm:Regular Rate:Normal     Neuro/Psych negative neurological ROS     GI/Hepatic negative GI ROS, Neg liver ROS,,,  Endo/Other  negative endocrine ROS    Renal/GU negative Renal ROS     Musculoskeletal  (+) Arthritis ,    Abdominal   Peds  Hematology negative hematology ROS (+)   Anesthesia Other Findings   Reproductive/Obstetrics                             Anesthesia Physical Anesthesia Plan  ASA: 2  Anesthesia Plan: General   Post-op Pain Management: Tylenol PO (pre-op)*   Induction: Intravenous  PONV Risk Score and Plan: 3 and Propofol infusion, Ondansetron, TIVA and Treatment may vary due to age or medical condition  Airway Management Planned: Natural Airway and Simple Face Mask  Additional Equipment: None  Intra-op Plan:   Post-operative Plan:   Informed Consent: I have reviewed the patients History and Physical, chart, labs and discussed the procedure including the risks, benefits and alternatives for the proposed anesthesia with the patient or authorized representative who has indicated his/her understanding and acceptance.     Dental advisory given  Plan Discussed with: CRNA  Anesthesia Plan Comments: (PAT note by Antionette Poles, PA-C: Follows with EP cardiology for history of PVCs and A-fib status post recent ablation February 2024.  Last seen by Dr. Nelly Laurence 12/05/2022 and noted to be maintaining sinus  rhythm.  She was continued on Eliquis and metoprolol.  Patient will need day of surgery labs and evaluation.  EKG 12/05/2022: NSR.  Rate 67.  Coronary CTA 05/15/2022: IMPRESSION: 1. No evidence of CAD, CADRADS = 0.  2. Coronary calcium score of 0. This was 0 percentile for age and sex matched control.  3. Normal coronary origin with right dominance.  4. Normal left atrial appendage with a probable mixing defect in the apex, thrombus is less likely.  5. Consider non-coronary causes of chest pain.  TTE 05/05/2022: 1. Left ventricular ejection fraction, by estimation, is 60 to 65%. The  left ventricle has normal function. The left ventricle has no regional  wall motion abnormalities. There is mild concentric left ventricular  hypertrophy. Left ventricular diastolic  parameters are indeterminate.  2. Right ventricular systolic function is normal. The right ventricular  size is normal. There is normal pulmonary artery systolic pressure.  3. The mitral valve is normal in structure. No evidence of mitral valve  regurgitation. No evidence of mitral stenosis.  4. The aortic valve is normal in structure. Aortic valve regurgitation is  mild. No aortic stenosis is present.  5. The inferior vena cava is normal in size with greater than 50%  respiratory variability, suggesting right atrial pressure of 3 mmHg.   )        Anesthesia Quick Evaluation

## 2023-01-06 ENCOUNTER — Ambulatory Visit (HOSPITAL_COMMUNITY)
Admission: RE | Admit: 2023-01-06 | Discharge: 2023-01-06 | Disposition: A | Discharge status: Home / Self Care | Payer: Commercial Managed Care - PPO | Attending: Otolaryngology | Admitting: Otolaryngology

## 2023-01-06 ENCOUNTER — Ambulatory Visit (HOSPITAL_BASED_OUTPATIENT_CLINIC_OR_DEPARTMENT_OTHER): Payer: Commercial Managed Care - PPO | Admitting: Physician Assistant

## 2023-01-06 ENCOUNTER — Encounter (HOSPITAL_COMMUNITY): Payer: Self-pay | Admitting: Otolaryngology

## 2023-01-06 ENCOUNTER — Encounter (HOSPITAL_COMMUNITY)
Admission: RE | Disposition: A | Discharge status: Home / Self Care | Payer: Self-pay | Source: Home / Self Care | Attending: Otolaryngology

## 2023-01-06 ENCOUNTER — Ambulatory Visit (HOSPITAL_COMMUNITY): Payer: Commercial Managed Care - PPO | Admitting: Physician Assistant

## 2023-01-06 ENCOUNTER — Other Ambulatory Visit: Payer: Self-pay

## 2023-01-06 DIAGNOSIS — G4733 Obstructive sleep apnea (adult) (pediatric): Secondary | ICD-10-CM

## 2023-01-06 DIAGNOSIS — I4891 Unspecified atrial fibrillation: Secondary | ICD-10-CM

## 2023-01-06 DIAGNOSIS — I10 Essential (primary) hypertension: Secondary | ICD-10-CM

## 2023-01-06 DIAGNOSIS — Z79899 Other long term (current) drug therapy: Secondary | ICD-10-CM | POA: Insufficient documentation

## 2023-01-06 HISTORY — DX: Dyspnea, unspecified: R06.00

## 2023-01-06 HISTORY — DX: Sleep apnea, unspecified: G47.30

## 2023-01-06 HISTORY — PX: DRUG INDUCED ENDOSCOPY: SHX6808

## 2023-01-06 LAB — BASIC METABOLIC PANEL
Anion gap: 14 (ref 5–15)
BUN: 7 mg/dL (ref 6–20)
CO2: 22 mmol/L (ref 22–32)
Calcium: 9.1 mg/dL (ref 8.9–10.3)
Chloride: 105 mmol/L (ref 98–111)
Creatinine, Ser: 0.79 mg/dL (ref 0.44–1.00)
GFR, Estimated: >60 mL/min (ref >60)
Glucose, Bld: 108 mg/dL — ABNORMAL HIGH (ref 70–99)
Potassium: 4.1 mmol/L (ref 3.5–5.1)
Sodium: 141 mmol/L (ref 135–145)

## 2023-01-06 LAB — CBC
HCT: 39.7 % (ref 36.0–46.0)
Hemoglobin: 13.1 g/dL (ref 12.0–15.0)
MCH: 29.9 pg (ref 26.0–34.0)
MCHC: 33 g/dL (ref 30.0–36.0)
MCV: 90.6 fL (ref 80.0–100.0)
Platelets: 233 10*3/uL (ref 150–400)
RBC: 4.38 MIL/uL (ref 3.87–5.11)
RDW: 11.9 % (ref 11.5–15.5)
WBC: 7 10*3/uL (ref 4.0–10.5)
nRBC: 0 % (ref 0.0–0.2)

## 2023-01-06 SURGERY — DRUG INDUCED SLEEP ENDOSCOPY
Anesthesia: Monitor Anesthesia Care | Laterality: Bilateral

## 2023-01-06 MED ORDER — ONDANSETRON HCL 4 MG/2ML IJ SOLN
INTRAMUSCULAR | Status: DC | PRN
  Administered 2023-01-06: 4 mg via INTRAVENOUS

## 2023-01-06 MED ORDER — LIDOCAINE-EPINEPHRINE 1 %-1:100000 IJ SOLN
INTRAMUSCULAR | Status: AC
  Filled 2023-01-06: qty 1

## 2023-01-06 MED ORDER — ACETAMINOPHEN 500 MG PO TABS
1000.0000 mg | ORAL_TABLET | Freq: Once | ORAL | Status: AC
  Administered 2023-01-06: 1000 mg via ORAL
  Filled 2023-01-06: qty 2

## 2023-01-06 MED ORDER — CHLORHEXIDINE GLUCONATE 0.12 % MT SOLN
15.0000 mL | Freq: Once | OROMUCOSAL | Status: AC
  Administered 2023-01-06: 15 mL via OROMUCOSAL
  Filled 2023-01-06: qty 15

## 2023-01-06 MED ORDER — PROPOFOL 500 MG/50ML IV EMUL
INTRAVENOUS | Status: DC | PRN
  Administered 2023-01-06: 10 mg via INTRAVENOUS
  Administered 2023-01-06: 100 ug/kg/min via INTRAVENOUS

## 2023-01-06 MED ORDER — OXYMETAZOLINE HCL 0.05 % NA SOLN
NASAL | Status: DC | PRN
  Administered 2023-01-06: 1

## 2023-01-06 MED ORDER — ORAL CARE MOUTH RINSE: 15.0000 mL | Freq: Once | OROMUCOSAL | Status: AC

## 2023-01-06 MED ORDER — LIDOCAINE 2% (20 MG/ML) 5 ML SYRINGE
INTRAMUSCULAR | Status: DC | PRN
  Administered 2023-01-06: 40 mg via INTRAVENOUS

## 2023-01-06 MED ORDER — OXYMETAZOLINE HCL 0.05 % NA SOLN
NASAL | Status: AC
  Filled 2023-01-06: qty 30

## 2023-01-06 MED ORDER — LACTATED RINGERS IV SOLN: INTRAVENOUS | Status: DC

## 2023-01-06 SURGICAL SUPPLY — 13 items
BAG COUNTER SPONGE SURGICOUNT (BAG) IMPLANT
BAG SPNG CNTER NS LX DISP (BAG)
CANISTER SUCT 1200ML W/VALVE (MISCELLANEOUS) ×2 IMPLANT
GLOVE BIO SURGEON STRL SZ7.5 (GLOVE) ×2 IMPLANT
KIT BASIN OR (CUSTOM PROCEDURE TRAY) ×2 IMPLANT
NDL PRECISIONGLIDE 27X1.5 (NEEDLE) IMPLANT
NEEDLE PRECISIONGLIDE 27X1.5 (NEEDLE) ×1 IMPLANT
PATTIES SURGICAL .5 X3 (DISPOSABLE) ×2 IMPLANT
SHEET MEDIUM DRAPE 40X70 STRL (DRAPES) IMPLANT
SOL ANTI FOG 6CC (MISCELLANEOUS) ×2 IMPLANT
SYR CONTROL 10ML LL (SYRINGE) IMPLANT
TOWEL GREEN STERILE FF (TOWEL DISPOSABLE) ×2 IMPLANT
TUBE CONNECTING 20X1/4 (TUBING) ×2 IMPLANT

## 2023-01-06 NOTE — H&P (Signed)
Samantha Terrell is an 62 y.o. female.   Chief Complaint: Sleep apnea HPI: 62 year old female with sleep apnea with difficulty tolerating CPAP.  Past Medical History:  Diagnosis Date   Atrial fibrillation (HCC)    Degenerative disc disease, cervical    Depression    Dyspnea    Family history of adverse reaction to anesthesia    mother-- ponv   GAD (generalized anxiety disorder)    History of adenomatous polyp of colon    History of COVID-19 11/2020   per pt mild symptoms that resolved   Hypertension    PVC (premature ventricular contraction)    symptomatic intermittant palpitations  (event monitor in epic 12-03-2014 showed SR w/ PVCs)   Sleep apnea    SUI (stress urinary incontinence, female)     Past Surgical History:  Procedure Laterality Date   ATRIAL FIBRILLATION ABLATION N/A 09/05/2022   Procedure: ATRIAL FIBRILLATION ABLATION;  Surgeon: Maurice Small, MD;  [facility];  Service: Cardiovascular;  Laterality: N/A;   BLADDER SUSPENSION N/A 11/06/2021   Procedure: TRANSVAGINAL TAPE (TVT) PROCEDURE;  Surgeon: Carrington Clamp, MD;  Location: [facility];  Service: Gynecology;  Laterality: N/A;   COLONOSCOPY WITH PROPOFOL  06/07/2021   by Dr.Beavers   CYSTOCELE REPAIR N/A 11/06/2021   Procedure: ANTERIOR REPAIR (CYSTOCELE);  Surgeon: Carrington Clamp, MD;  Location: [facility];  Service: Gynecology;  Laterality: N/A;   CYSTOSCOPY N/A 11/06/2021   Procedure: CYSTOSCOPY;  Surgeon: Carrington Clamp, MD;  Location: [facility];  Service: Gynecology;  Laterality: N/A;   NASAL SEPTUM SURGERY  1986    Family History  Problem Relation Age of Onset   Arthritis Mother        bilateral knees   Hyperlipidemia Father    Cancer Maternal Grandmother        cervical cancer   Osteoporosis Maternal Grandmother    Heart attack Maternal Grandmother        age 79   Congestive Heart Failure Maternal Grandmother        age  59   Arthritis Maternal Grandfather        Severe, bilateral hands   Vascular Disease Maternal Grandfather        smoker, PAD   Osteoporosis Paternal Grandmother    Colon cancer Neg Hx    Breast cancer Neg Hx    Colon polyps Neg Hx    Esophageal cancer Neg Hx    Rectal cancer Neg Hx    Stomach cancer Neg Hx    Social History:  reports that she has never smoked. She has never used smokeless tobacco. She reports current alcohol use of about 4.0 - 5.0 standard drinks of alcohol per week. She reports that she does not use drugs.  Allergies:  Allergies  Allergen Reactions   Latex Itching    Medications Prior to Admission  Medication Sig Dispense Refill   apixaban (ELIQUIS) 5 MG TABS tablet Take 1 tablet (5 mg total) by mouth 2 (two) times daily. 180 tablet 2   ciclopirox (LOPROX) 0.77 % cream Apply topically 2 (two) times daily. (Patient taking differently: Apply 1 application  topically 2 (two) times daily.) 15 g 0   cyanocobalamin (VITAMIN B12) 500 MCG tablet Take 500 mcg by mouth daily.     losartan (COZAAR) 50 MG tablet Take 1 tablet (50 mg total) by mouth daily. 90 tablet 3   magnesium (MAGTAB) 84 MG ( ) TBCR SR tablet  Take 84 mg by mouth daily. Pt states she takes it 3 times a day. Pt is not sure about the dosage.     metoprolol succinate (TOPROL-XL) 25 MG 24 hr tablet Take 1 tablet (25 mg total) by mouth 2 (two) times daily. (Patient taking differently: Take 25 mg by mouth daily.) 180 tablet 1   norethindrone-ethinyl estradiol (FYAVOLV) 1-5 MG-MCG TABS tablet Take 1 tablet by mouth daily.     OVER THE COUNTER MEDICATION Take 5,000 mcg by mouth 2 (two) times a day. DHT blocker     sertraline (ZOLOFT) 50 MG tablet Take 50 mg by mouth daily with lunch.     Turmeric 450 MG CAPS Take 450 mg by mouth daily.     Vitamin D, Cholecalciferol, 1000 UNITS CAPS Take 1,000 Units by mouth at bedtime.     pantoprazole (PROTONIX) 40 MG tablet Take 1 tablet (40 mg total) by mouth daily. (Patient  not taking: Reported on 12/30/2022) 30 tablet 0    Results for orders placed or performed during the hospital encounter of 01/06/23 (from the past 48 hour(s))  CBC     Status: None   Collection Time: 01/06/23  6:16 AM  Result Value Ref Range   WBC 7.0 4.0 - 10.5 K/uL   RBC 4.38 3.87 - 5.11 MIL/uL   Hemoglobin 13.1 12.0 - 15.0 g/dL   HCT 54.0 98.1 - 19.1 %   MCV 90.6 80.0 - 100.0 fL   MCH 29.9 26.0 - 34.0 pg   MCHC 33.0 30.0 - 36.0 g/dL   RDW 47.8 29.5 - 62.1 %   Platelets 233 150 - 400 K/uL   nRBC 0.0 0.0 - 0.2 %    Comment: Performed at [facility], [address], [place], [place] [address]  Basic metabolic panel     Status: Abnormal   Collection Time: 01/06/23  6:16 AM  Result Value Ref Range   Sodium 141 135 - 145 mmol/L   Potassium 4.1 3.5 - 5.1 mmol/L   Chloride 105 98 - 111 mmol/L   CO2 22 22 - 32 mmol/L   Glucose, Bld 108 (H) 70 - 99 mg/dL    Comment: Glucose reference range applies only to samples taken after fasting for at least 8 hours.   BUN 7 6 - 20 mg/dL   Creatinine, Ser 7.84 0.44 - 1.00 mg/dL   Calcium 9.1 8.9 - 69.6 mg/dL   GFR, Estimated >29 >52 mL/min    Comment: (NOTE) Calculated using the CKD-EPI Creatinine Equation (2021)    Anion gap 14 5 - 15    Comment: Performed at [facility], [address], [place], [place] [address]   No results found.  Review of Systems  All other systems reviewed and are negative.   Blood pressure 123/71, pulse 70, temperature 98.5 F (36.9 C), temperature source Oral, resp. rate 17, height 5' 7.5" (1.715 m), weight 86.6 kg, SpO2 96 %. Physical Exam Constitutional:      Appearance: Normal appearance. She is normal weight.  HENT:     Head: Normocephalic and atraumatic.     Right Ear: External ear normal.     Left Ear: External ear normal.     Nose: Nose normal.     Mouth/Throat:     Mouth: Mucous membranes are moist.     Pharynx: Oropharynx is clear.  Eyes:     Extraocular Movements: Extraocular  movements intact.     Conjunctiva/sclera: Conjunctivae normal.  Pupils: Pupils are equal, round, and reactive to light.  Cardiovascular:     Rate and Rhythm: Normal rate.  Pulmonary:     Effort: Pulmonary effort is normal.  Musculoskeletal:     Cervical back: Normal range of motion.  Skin:    General: Skin is warm and dry.  Neurological:     General: No focal deficit present.     Mental Status: She is alert and oriented to person, place, and time.  Psychiatric:        Mood and Affect: Mood normal.        Behavior: Behavior normal.        Thought Content: Thought content normal.        Judgment: Judgment normal.      Assessment/Plan Obstructive sleep apnea and BMI 29.47  To OR for sleep endoscopy.  Christia Reading, MD 01/06/2023, 8:11 AM

## 2023-01-06 NOTE — Transfer of Care (Signed)
Immediate Anesthesia Transfer of Care Note  Patient: Samantha Terrell  Procedure(s) Performed: DRUG INDUCED SLEEP ENDOSCOPY (Bilateral)  Patient Location: PACU  Anesthesia Type:MAC  Level of Consciousness: awake, alert , oriented, and patient cooperative  Airway & Oxygen Therapy: Patient Spontanous Breathing  Post-op Assessment: Report given to RN and Post -op Vital signs reviewed and stable  Post vital signs: Reviewed and stable  Last Vitals:  Vitals Value Taken Time  BP 104/53 01/06/23 0843  Temp    Pulse 69 01/06/23 0844  Resp 13 01/06/23 0844  SpO2 97 % 01/06/23 0844  Vitals shown include unvalidated device data.  Last Pain:  Vitals:   01/06/23 0621  TempSrc:   PainSc: 0-No pain         Complications: No notable events documented.

## 2023-01-06 NOTE — Anesthesia Postprocedure Evaluation (Signed)
Anesthesia Post Note  Patient: Samantha Terrell  Procedure(s) Performed: DRUG INDUCED SLEEP ENDOSCOPY (Bilateral)     Patient location during evaluation: PACU Anesthesia Type: General Level of consciousness: awake and alert Pain management: pain level controlled Vital Signs Assessment: post-procedure vital signs reviewed and stable Respiratory status: spontaneous breathing, nonlabored ventilation, respiratory function stable and patient connected to nasal cannula oxygen Cardiovascular status: blood pressure returned to baseline and stable Postop Assessment: no apparent nausea or vomiting Anesthetic complications: no  No notable events documented.  Last Vitals:  Vitals:   01/06/23 0900 01/06/23 0915  BP: (!) 110/59 118/64  Pulse: 66 68  Resp: 19 20  Temp:  36.7 C  SpO2: 97% 98%    Last Pain:  Vitals:   01/06/23 0915  TempSrc:   PainSc: 0-No pain                 Kennieth Rad

## 2023-01-06 NOTE — Brief Op Note (Signed)
01/06/2023  8:41 AM  PATIENT:  Samantha Terrell  62 y.o. female  PRE-OPERATIVE DIAGNOSIS:  BMI 29.0-29.9,adult Nasal obstruction Obstructive Sleep Apnea  POST-OPERATIVE DIAGNOSIS:  BMI 29.0-29.9,adult Nasal obstruction Obstructive Sleep Apnea  PROCEDURE:  Procedure(s): DRUG INDUCED SLEEP ENDOSCOPY (Bilateral)  SURGEON:  Surgeon(s) and Role:    Christia Reading, MD - Primary  PHYSICIAN ASSISTANT:   ASSISTANTS: none   ANESTHESIA:   IV sedation  EBL:  None   BLOOD ADMINISTERED:none  DRAINS: none   LOCAL MEDICATIONS USED:  NONE  SPECIMEN:  No Specimen  DISPOSITION OF SPECIMEN:  N/A  COUNTS:  YES  TOURNIQUET:  * No tourniquets in log *  DICTATION: .Note written in EPIC  PLAN OF CARE: Discharge to home after PACU  PATIENT DISPOSITION:  PACU - hemodynamically stable.   Delay start of Pharmacological VTE agent (>24hrs) due to surgical blood loss or risk of bleeding: no

## 2023-01-06 NOTE — Op Note (Signed)
Preop diagnosis: Obstructive sleep apnea Postop diagnosis: same Procedure: Drug-induced sleep endoscopy Surgeon: Jenne Pane Anesth: IV sedation Compl: None Findings: There is 75% anterior-posterior collapse at the velum making her a candidate for hypoglossal nerve stimulator placement.  There was also significant anterior-posterior collapse at the tongue base. Description:  After discussing risks, benefits, and alternatives, the patient was brought to the operative suite and placed on the operative table in the supine position.  Anesthesia was induced and the patient was given light sedation to simulate natural sleep. When the proper level was reached, an Afrin-soaked pledget was placed in the right nasal passage for a couple of minutes and then removed.  The fiberoptic laryngoscope was then passed to view the pharynx and larynx.  Findings are noted above and the exam was recorded.  After completion, the scope was removed and the patient was returned to anesthesia for wakeup and was moved to the recovery room in stable condition.

## 2023-01-07 ENCOUNTER — Encounter (HOSPITAL_COMMUNITY): Payer: Self-pay | Admitting: Otolaryngology

## 2023-01-20 ENCOUNTER — Telehealth: Payer: Self-pay | Admitting: *Deleted

## 2023-01-20 DIAGNOSIS — G4733 Obstructive sleep apnea (adult) (pediatric): Secondary | ICD-10-CM

## 2023-01-20 DIAGNOSIS — I10 Essential (primary) hypertension: Secondary | ICD-10-CM

## 2023-01-20 DIAGNOSIS — R400 Somnolence: Secondary | ICD-10-CM

## 2023-01-20 NOTE — Telephone Encounter (Signed)
-----   Message from Quintella Reichert, MD sent at 01/17/2023  9:26 PM EDT ----- Patient needs to get an oral device before insurance will pay for Inspire.  Please find out if she has any issues with TMJ, significant dry mouth, dentures, braces, crowns or bridges that would preclude using an oral device.  If she has not tried an oral device and has none of the above issues in her mouth then she needs to order an oral device online to try first.  Once she has the oral device she needs to let us know so we can order an in lab PSG with the oral device in place dufing the study

## 2023-01-20 NOTE — Telephone Encounter (Signed)
Reached out to the patient and she states she has an oral device already but she can not sleep in it or breathe in it she feels claustrophobic with it.  She has mouth dryness and two crowns that does not preclude using the oral device. Patient says she had itamar test done back in November ordered by Dr Nelly Laurence. Patient does have an oral device she is not using but will use it for the sleep study.   Quintella Reichert, MD  Reesa Chew, CMA [place] that patient needs an oral device before we complete the PSG - the whole point is before insurance will pay for inspire they have to try an oral device (can order one online) and then have a PSG using the oral device to document if still has significant OSA.  If patient cannot tolerate oral device to begin with or has contraindications as I stated below that prevent her from using an oral device then the PSG is not needed.

## 2023-01-21 NOTE — Telephone Encounter (Deleted)
Samantha Reichert, MD  Reesa Chew, CMA [place] that patient needs an oral device before we complete the PSG - the whole point is before insurance will pay for inspire they have to try an oral device (can order one online) and then have a PSG using the oral device to document if still has significant OSA.  If patient cannot tolerate oral device to begin with or has contraindications as I stated below that prevent her from using an oral device then the PSG is not needed.

## 2023-01-27 ENCOUNTER — Telehealth: Payer: Self-pay | Admitting: Cardiovascular Disease

## 2023-01-27 NOTE — Telephone Encounter (Signed)
Patient states that she was returning call. Please advise  

## 2023-02-06 NOTE — Telephone Encounter (Signed)
Sleep study submitted to [facility] for prior authorization. It is pending.

## 2023-02-12 NOTE — Telephone Encounter (Signed)
Prior Authorization for NPSG sent to [facility] via web portal. Tracking Number . No Auth Required

## 2023-03-03 ENCOUNTER — Other Ambulatory Visit (HOSPITAL_COMMUNITY): Payer: Self-pay | Admitting: *Deleted

## 2023-03-03 DIAGNOSIS — I493 Ventricular premature depolarization: Secondary | ICD-10-CM

## 2023-03-03 MED ORDER — METOPROLOL SUCCINATE ER 25 MG PO TB24: 25.0000 mg | ORAL_TABLET | Freq: Two times a day (BID) | ORAL | 6 refills | Status: DC

## 2023-03-09 NOTE — Telephone Encounter (Addendum)
Vernon at the sleep lab called to say patient was called to confirm her sleep study appointment and when asked if she had her mouth guard to sleep with she replied she did not have a mouth guard but a face mask that she can not tolerate to sleep in. Patient does have issues with significant dry mouth and a crown but she does not know if it would preclude using an oral device. I will cancel the study as you have stated because of the contraindications.

## 2023-03-10 NOTE — Telephone Encounter (Signed)
To get clarity on what needed to happen concerning this patients sleep study, Samantha Terrell spoke to Dr Mayford Knife and they agreed to cancel the sleep study and Dr Mayford Knife will speak with Dr Jenne Pane about the next step of care.

## 2023-03-13 ENCOUNTER — Encounter (HOSPITAL_BASED_OUTPATIENT_CLINIC_OR_DEPARTMENT_OTHER): Payer: Commercial Managed Care - PPO | Admitting: Cardiology

## 2023-03-31 ENCOUNTER — Encounter: Payer: Commercial Managed Care - PPO | Admitting: Nurse Practitioner

## 2023-04-01 ENCOUNTER — Ambulatory Visit (INDEPENDENT_AMBULATORY_CARE_PROVIDER_SITE_OTHER): Payer: Commercial Managed Care - PPO | Admitting: Nurse Practitioner

## 2023-04-01 ENCOUNTER — Encounter: Payer: Self-pay | Admitting: Nurse Practitioner

## 2023-04-01 VITALS — BP 112/72 | HR 77 | Temp 97.5°F | Ht 66.5 in | Wt 192.0 lb

## 2023-04-01 DIAGNOSIS — Z136 Encounter for screening for cardiovascular disorders: Secondary | ICD-10-CM

## 2023-04-01 DIAGNOSIS — E559 Vitamin D deficiency, unspecified: Secondary | ICD-10-CM

## 2023-04-01 DIAGNOSIS — N814 Uterovaginal prolapse, unspecified: Secondary | ICD-10-CM

## 2023-04-01 DIAGNOSIS — I700 Atherosclerosis of aorta: Secondary | ICD-10-CM | POA: Diagnosis not present

## 2023-04-01 DIAGNOSIS — E785 Hyperlipidemia, unspecified: Secondary | ICD-10-CM

## 2023-04-01 DIAGNOSIS — Z0000 Encounter for general adult medical examination without abnormal findings: Secondary | ICD-10-CM | POA: Diagnosis not present

## 2023-04-01 DIAGNOSIS — R7309 Other abnormal glucose: Secondary | ICD-10-CM

## 2023-04-01 DIAGNOSIS — Z860101 Personal history of adenomatous and serrated colon polyps: Secondary | ICD-10-CM

## 2023-04-01 DIAGNOSIS — G4733 Obstructive sleep apnea (adult) (pediatric): Secondary | ICD-10-CM

## 2023-04-01 DIAGNOSIS — Z1329 Encounter for screening for other suspected endocrine disorder: Secondary | ICD-10-CM

## 2023-04-01 DIAGNOSIS — Z1389 Encounter for screening for other disorder: Secondary | ICD-10-CM

## 2023-04-01 DIAGNOSIS — Z79899 Other long term (current) drug therapy: Secondary | ICD-10-CM

## 2023-04-01 DIAGNOSIS — Z0001 Encounter for general adult medical examination with abnormal findings: Secondary | ICD-10-CM

## 2023-04-01 DIAGNOSIS — I10 Essential (primary) hypertension: Secondary | ICD-10-CM

## 2023-04-01 DIAGNOSIS — E66811 Obesity, class 1: Secondary | ICD-10-CM

## 2023-04-01 DIAGNOSIS — Z86010 Personal history of colonic polyps: Secondary | ICD-10-CM

## 2023-04-01 DIAGNOSIS — F3342 Major depressive disorder, recurrent, in full remission: Secondary | ICD-10-CM

## 2023-04-01 DIAGNOSIS — E669 Obesity, unspecified: Secondary | ICD-10-CM

## 2023-04-01 DIAGNOSIS — I253 Aneurysm of heart: Secondary | ICD-10-CM

## 2023-04-01 DIAGNOSIS — I480 Paroxysmal atrial fibrillation: Secondary | ICD-10-CM

## 2023-04-01 NOTE — Progress Notes (Signed)
 Complete Physical  Assessment and Plan:  Encounter for Annual Physical Exam with abnormal findings Due annually  Health Maintenance reviewed Healthy lifestyle reviewed and goals set Request mmg and PAP reports from GYN  Paroxysmal Atrial fibrillation Status post ablation on September 05, 2022 Maintaining sinus rhythm Continue metoprolol XL 25 mg BID, Eliquis 5 mg BID Continue to follow with cardiology  Essential hypertension - continue medications Metoprolol 25 mg BID , losartan 50 mg every day   - Continue DASH diet, exercise and monitor at home. Call if greater than 130/80.  -  CMP/CBC  Atrial septal aneurysm Continue to follow with cardiology  Vitamin D deficiency Continue supplementation Check vitamin D level  Hyperlipidemia, unspecified hyperlipidemia type Discussed lifestyle modifications. Recommended diet heavy in fruits and veggies, omega 3's. Decrease consumption of animal meats, cheeses, and dairy products. Remain active and exercise as tolerated. - lipid panel  Recurrent major depressive disorder, in full remission (HCC)/ Anxiety Symptoms are currently controlled on Zoloft 50 mg every day - is weaning off medication.  Continue diet/exerise, practice good sleep hygiene  Abnormal glucose - Continue diet and exercise - A1c  Obesity  Long discussion about weight loss, diet, and exercise Recommended diet heavy in fruits and veggies and low in animal meats, cheeses, and dairy products, appropriate calorie intake Patient will work on decreasing saturated fats and simple carbs.  Increase lean protein and exercise Follow up at next visit    Pelvic prolapse with cystocele Follows with GYN, Dr. Henderson Cloud Surgical intervention 10/2021 Currently well controlled.  History of colon polyps Adenomatous, had 6 month recall per Dr. Orvan Falconer done 05/2021  and has recall for 05/2024 No new symptoms.    Medication management -     CBC with Differential/Platelet -      COMPLETE METABOLIC PANEL WITH GFR -     Magnesium -     Lipid panel -     TSH -     Hemoglobin A1C w/out eAG -     VITAMIN D 25 Hydroxy (Vit-D Deficiency, Fractures) -     [place], RETROPERITNL ABD,  LTD -     Urinalysis, Routine w reflex microscopic -     Microalbumin / creatinine urine ratio   Screening for hematuria or proteinuria -     Urinalysis, Routine w reflex microscopic -     Microalbumin / creatinine urine ratio  Screening for thyroid disorder -     TSH  Screening for AAA (abdominal aortic aneurysm) -     [place], RETROPERITNL ABD,  LTD  History of adenomatous polyp of colon Continue to follow with GI and get regular colonoscopy  OSA (obstructive sleep apnea) Continue to follow with ENT Cannot tolerate CPAP and trying to get inspire       Discussed med's effects and SE's. Screening labs and tests as requested with regular follow-up as recommended.  Over 40 minutes of exam, counseling, chart review, and complex, high level critical decision making was performed this visit.   Future Appointments  Date Time Provider Department Center  04/22/2023  1:00 PM [facility]  06/01/2023 11:45 AM Mealor, Roberts Gaudy, MD [facility]  03/31/2024  2:00 PM Raynelle Dick, NP [facility] None     HPI  62 y.o. female  presents for a complete physical and follow up for has Hypertension; Depression; Anxiety; Hyperlipidemia; Atrial septal aneurysm; Medication management; Vitamin D deficiency; Obesity (BMI 30.0-34.9); Pelvic prolapse with cystocele; Intermittent palpitations; History of adenomatous polyp of colon;  B12 deficiency; Female pattern baldness; Symptomatic PVCs; and Atrial fibrillation (HCC) on their problem list.   She is married, 1 son and works as an Airline pilot. She has changed jobs and works from home.   She has sleep apnea and has CPAP but cannot tolerate sleeping with CPAP, difficulty breathing.  May be getting Inspire implanted. She was initially  diagnosed by sleep study through cardiology.  She is being evaluated by ENT.  Cardiology questions OSA as cause of A Fib.   Has mainly been seeing Dr. Henderson Cloud (OB/GYN) and UC, getting estrogen. She has a mild prolapse with cystocele, and had surgical repair. Currently on [facility] 1/5mg  every day .  She had colonoscopy in 11/2020 with several polyps, f/u 05/2021,  follow up due in 3 years.   BMI is Body mass index is 30.53 kg/m., she has been working on diet and exercise.  She runs on the treadmill 4 times/week for approximately 30 min.She is limiting red meat. She is trying to limit simple carbs.  Wt Readings from Last 3 Encounters:  04/01/23 192 lb (87.1 kg)  01/06/23 191 lb (86.6 kg)  12/05/22 190 lb 9.6 oz (86.5 kg)   She is on zoloft 100 mg 1/2 tab daily for anxiety and symptoms are currently well controlled. She is trying to wean herself off and is taking 50 mg every other day and plans to take 1/4 pill for a week and then stop.   Previously evaluated by Dr. Sondra Come, had echo with LVH, atrial septal aneursym. Holter in 2016 showed PVCs without tachyarrhythmia. Diagnosed with Atrial Fibrillation in 02/2022- she is s/p ablation 08/2022 which returned to normal rhythm.  Continues on Metoprolol 25 mg BID and Eliquis 5 mg BID.  Continues to follow with Dr. Nelly Laurence. Occasional palpitation when fatigued.  On previous CT done through cardiology she states there was an abnormality and has a follow up CT 04/22/23  She reports BP is well controlled, she has been taking Losartan 50 mg every day and Metoprolol 25 mg BID, today their BP is BP: 112/72.  Occasional shortness of breath with exercise BP Readings from Last 3 Encounters:  04/01/23 112/72  01/06/23 118/64  12/05/22 138/84  Denies chest pain, dizziness or headaches.    She is not on cholesterol medication (is on fiber supplement). Her cholesterol is not at goal. She has been working on diet for cholesterol. The cholesterol last visit was:   Lab  Results  Component Value Date   CHOL 194 03/26/2022   HDL 61 03/26/2022   LDLCALC 108 (H) 03/26/2022   TRIG 137 03/26/2022   CHOLHDL 3.2 03/26/2022    Last A1C in the office was:   Lab Results  Component Value Date   HGBA1C 5.3 03/26/2022   She does drink lots of water. Last GFR: Lab Results  Component Value Date   GFRNONAA >60 01/06/2023   Patient is on Vitamin D supplement, taking 1000 IU daily. Lab Results  Component Value Date   VD25OH 66 03/26/2022     B12 is controlled. Lab Results  Component Value Date   VITAMINB12 358 12/19/2019    Current Medications:  Current Outpatient Medications on File Prior to Visit  Medication Sig Dispense Refill   apixaban (ELIQUIS) 5 MG TABS tablet Take 1 tablet (5 mg total) by mouth 2 (two) times daily. 180 tablet 2   ciclopirox (LOPROX) 0.77 % cream Apply topically 2 (two) times daily. (Patient taking differently: Apply 1 application  topically 2 (two) times  daily.) 15 g 0   cyanocobalamin (VITAMIN B12) 500 MCG tablet Take 500 mcg by mouth daily.     losartan (COZAAR) 50 MG tablet Take 1 tablet (50 mg total) by mouth daily. 90 tablet 3   magnesium (MAGTAB) 84 MG ( ) TBCR SR tablet Take 84 mg by mouth daily. Pt states she takes it 3 times a day. Pt is not sure about the dosage.     metoprolol succinate (TOPROL-XL) 25 MG 24 hr tablet Take 1 tablet (25 mg total) by mouth 2 (two) times daily. 60 tablet 6   norethindrone-ethinyl estradiol (FYAVOLV) 1-5 MG-MCG TABS tablet Take 1 tablet by mouth daily.     OVER THE COUNTER MEDICATION Take 5,000 mcg by mouth 2 (two) times a day. DHT blocker     sertraline (ZOLOFT) 50 MG tablet Take 50 mg by mouth daily with lunch.     Vitamin D, Cholecalciferol, 1000 UNITS CAPS Take 1,000 Units by mouth at bedtime.     pantoprazole (PROTONIX) 40 MG tablet Take 1 tablet (40 mg total) by mouth daily. (Patient not taking: Reported on 12/30/2022) 30 tablet 0   Turmeric 450 MG CAPS Take 450 mg by mouth daily.  (Patient not taking: Reported on 04/01/2023)     No current facility-administered medications on file prior to visit.   Allergies:  Allergies  Allergen Reactions   Latex Itching   Medical History:  She has Hypertension; Depression; Anxiety; Hyperlipidemia; Atrial septal aneurysm; Medication management; Vitamin D deficiency; Obesity (BMI 30.0-34.9); Pelvic prolapse with cystocele; Intermittent palpitations; History of adenomatous polyp of colon; B12 deficiency; Female pattern baldness; Symptomatic PVCs; and Atrial fibrillation (HCC) on their problem list.   Health Maintenance:    Immunization History  Administered Date(s) Administered   Influenza-Unspecified 04/20/2015, 04/23/2017, 04/16/2018   Janssen (J&J) SARS-COV-2 Vaccination 10/08/2019   Moderna SARS-COV2 Booster Vaccination 06/02/2020   Pneumococcal Polysaccharide-23 04/20/2015   Tdap 04/20/2015   Zoster Recombinant(Shingrix) 04/23/2017, 08/28/2017   Health Maintenance  Topic Date Due   COVID-19 Vaccine (2 - Janssen risk series) 06/30/2020   INFLUENZA VACCINE  02/26/2023   MAMMOGRAM  12/20/2023   Colonoscopy  06/07/2024   PAP SMEAR-Modifier  12/19/2024   DTaP/Tdap/Td (2 - Td or Tdap) 04/19/2025   Hepatitis C Screening  Completed   HIV Screening  Completed   Zoster Vaccines- Shingrix  Completed   HPV VACCINES  Aged Out     Last Dental Exam: Dr. Dutch Quint, last 2023, goes q8m Last Eye Exam: wears glasses, [facility], last exam 07/2019, going every other year  Patient Care Team: Judd Gaudier, NP as PCP - General (Nurse Practitioner) Mealor, Roberts Gaudy, MD as PCP - Electrophysiology (Cardiology) Carrington Clamp, MD as Consulting Physician (Obstetrics and Gynecology) Tressia Danas, MD (Inactive) as Consulting Physician (Gastroenterology)  Surgical History:  She has a past surgical history that includes Colonoscopy with propofol (06/07/2021); Nasal septum surgery (1986); Bladder suspension (N/A, 11/06/2021);  Cystocele repair (N/A, 11/06/2021); Cystoscopy (N/A, 11/06/2021); ATRIAL FIBRILLATION ABLATION (N/A, 09/05/2022); and Drug induced endoscopy (Bilateral, 01/06/2023). Family History:  Herfamily history includes Arthritis in her maternal grandfather and mother; Cancer in her maternal grandmother; Congestive Heart Failure in her maternal grandmother; Heart attack in her maternal grandmother; Hyperlipidemia in her father; Osteoporosis in her maternal grandmother and paternal grandmother; Vascular Disease in her maternal grandfather. Social History:  She reports that she has never smoked. She has never used smokeless tobacco. She reports current alcohol use of about 4.0 - 5.0 standard drinks of alcohol  per week. She reports that she does not use drugs.  Review of Systems: Review of Systems  Constitutional:  Negative for malaise/fatigue and weight loss.  HENT:  Negative for hearing loss and tinnitus.   Eyes:  Negative for blurred vision and double vision.  Respiratory:  Negative for cough, sputum production, shortness of breath and wheezing.   Cardiovascular:  Positive for palpitations (increased with lack of sleep). Negative for chest pain, orthopnea, claudication, leg swelling and PND.  Gastrointestinal:  Negative for abdominal pain, blood in stool, constipation, diarrhea, heartburn, melena, nausea and vomiting.  Genitourinary: Negative.   Musculoskeletal:  Positive for joint pain (knees occasional pain). Negative for falls and myalgias.  Skin:  Negative for rash.  Neurological:  Negative for dizziness, tingling, sensory change, weakness and headaches.  Endo/Heme/Allergies:  Negative for polydipsia.  Psychiatric/Behavioral: Negative.  Negative for depression, memory loss, substance abuse and suicidal ideas. The patient is not nervous/anxious and does not have insomnia.   All other systems reviewed and are negative.   Physical Exam: Estimated body mass index is 30.53 kg/m as calculated from the  following:   Height as of this encounter: 5' 6.5" (1.689 m).   Weight as of this encounter: 192 lb (87.1 kg). BP 112/72   Pulse 77   Temp (!) 97.5 F (36.4 C)   Ht 5' 6.5" (1.689 m)   Wt 192 lb (87.1 kg)   LMP  (LMP Unknown)   SpO2 98%   BMI 30.53 kg/m  General Appearance: Well nourished, in no apparent distress.  Eyes: PERRLA, EOMs, conjunctiva no swelling or erythema Sinuses: No Frontal/maxillary tenderness  ENT/Mouth: Ext aud canals clear, normal light reflex with TMs without erythema, bulging. Good dentition. No erythema, swelling, or exudate on post pharynx.  Hearing normal.  Neck: Supple, thyroid normal. No bruits  Respiratory: Respiratory effort normal, BS equal bilaterally without rales, rhonchi, wheezing or stridor.  Cardio: RRR, no murmurs rubs or gallops Chest: symmetric, with normal excursions and percussion.  Breasts: Defer to GYN Abdomen: Soft, nontender, no guarding, rebound, hernias, masses, or organomegaly.  Lymphatics: Non tender without lymphadenopathy.  Genitourinary: Defer to GYN Musculoskeletal: Full ROM all peripheral extremities,5/5 strength, and normal gait.  Skin: Warm, dry without rashes;  hair thinning in female baldness pattern;  Neuro: Cranial nerves intact, reflexes equal bilaterally. Normal muscle tone, no cerebellar symptoms. Sensation intact.  Psych: Awake and oriented X 3, normal affect, Insight and Judgment appropriate.   EKG:  defer, had at cardiology 11/2022 AAA: < 3 cm  Joyanne Eddinger E  2:27 PM [facility]

## 2023-04-01 NOTE — Patient Instructions (Signed)

## 2023-04-02 LAB — URINALYSIS, ROUTINE W REFLEX MICROSCOPIC
Bacteria, UA: NONE SEEN /HPF
Bilirubin Urine: NEGATIVE
Glucose, UA: NEGATIVE
Hgb urine dipstick: NEGATIVE
Hyaline Cast: NONE SEEN /LPF
Ketones, ur: NEGATIVE
Nitrite: NEGATIVE
Protein, ur: NEGATIVE
RBC / HPF: NONE SEEN /HPF (ref 0–2)
Specific Gravity, Urine: 1.004 (ref 1.001–1.035)
pH: 6.5 (ref 5.0–8.0)

## 2023-04-02 LAB — COMPLETE METABOLIC PANEL WITH GFR
AG Ratio: 1.8 (calc) (ref 1.0–2.5)
ALT: 29 U/L (ref 6–29)
AST: 28 U/L (ref 10–35)
Albumin: 4.5 g/dL (ref 3.6–5.1)
Alkaline phosphatase (APISO): 83 U/L (ref 37–153)
BUN: 9 mg/dL (ref 7–25)
CO2: 27 mmol/L (ref 20–32)
Calcium: 10.1 mg/dL (ref 8.6–10.4)
Chloride: 105 mmol/L (ref 98–110)
Creat: 0.78 mg/dL (ref 0.50–1.05)
Globulin: 2.5 g/dL (ref 1.9–3.7)
Glucose, Bld: 121 mg/dL — ABNORMAL HIGH (ref 65–99)
Potassium: 4.5 mmol/L (ref 3.5–5.3)
Sodium: 142 mmol/L (ref 135–146)
Total Bilirubin: 0.6 mg/dL (ref 0.2–1.2)
Total Protein: 7 g/dL (ref 6.1–8.1)
eGFR: 87 mL/min/{1.73_m2} (ref >=60)

## 2023-04-02 LAB — LIPID PANEL
Cholesterol: 175 mg/dL (ref <200)
HDL: 57 mg/dL (ref >=50)
LDL Cholesterol (Calc): 98 mg/dL
Non-HDL Cholesterol (Calc): 118 mg/dL (ref <130)
Total CHOL/HDL Ratio: 3.1 (calc) (ref <5.0)
Triglycerides: 107 mg/dL (ref <150)

## 2023-04-02 LAB — MICROALBUMIN / CREATININE URINE RATIO
Creatinine, Urine: 28 mg/dL (ref 20–275)
Microalb Creat Ratio: 14 mg/g{creat} (ref <30)
Microalb, Ur: 0.4 mg/dL

## 2023-04-02 LAB — CBC WITH DIFFERENTIAL/PLATELET
Absolute Monocytes: 583 {cells}/uL (ref 200–950)
Basophils Absolute: 40 {cells}/uL (ref 0–200)
Basophils Relative: 0.6 %
Eosinophils Absolute: 67 {cells}/uL (ref 15–500)
Eosinophils Relative: 1 %
HCT: 40.5 % (ref 35.0–45.0)
Hemoglobin: 13.4 g/dL (ref 11.7–15.5)
Lymphs Abs: 2104 {cells}/uL (ref 850–3900)
MCH: 30.5 pg (ref 27.0–33.0)
MCHC: 33.1 g/dL (ref 32.0–36.0)
MCV: 92.3 fL (ref 80.0–100.0)
MPV: 9.5 fL (ref 7.5–12.5)
Monocytes Relative: 8.7 %
Neutro Abs: 3906 {cells}/uL (ref 1500–7800)
Neutrophils Relative %: 58.3 %
Platelets: 267 10*3/uL (ref 140–400)
RBC: 4.39 10*6/uL (ref 3.80–5.10)
RDW: 11.7 % (ref 11.0–15.0)
Total Lymphocyte: 31.4 %
WBC: 6.7 10*3/uL (ref 3.8–10.8)

## 2023-04-02 LAB — VITAMIN D 25 HYDROXY (VIT D DEFICIENCY, FRACTURES): Vit D, 25-Hydroxy: 76 ng/mL (ref 30–100)

## 2023-04-02 LAB — TSH: TSH: 1.12 m[IU]/L (ref 0.40–4.50)

## 2023-04-02 LAB — MICROSCOPIC MESSAGE

## 2023-04-02 LAB — MAGNESIUM: Magnesium: 2 mg/dL (ref 1.5–2.5)

## 2023-04-02 LAB — HEMOGLOBIN A1C W/OUT EAG: Hgb A1c MFr Bld: 5.6 %{Hb} (ref <5.7)

## 2023-04-20 ENCOUNTER — Encounter: Payer: Self-pay | Admitting: Nurse Practitioner

## 2023-04-22 ENCOUNTER — Ambulatory Visit (HOSPITAL_BASED_OUTPATIENT_CLINIC_OR_DEPARTMENT_OTHER)
Admission: RE | Admit: 2023-04-22 | Discharge: 2023-04-22 | Disposition: A | Discharge status: Home / Self Care | Payer: Commercial Managed Care - PPO | Source: Ambulatory Visit | Attending: Cardiology | Admitting: Cardiology

## 2023-04-22 DIAGNOSIS — R911 Solitary pulmonary nodule: Secondary | ICD-10-CM | POA: Diagnosis present

## 2023-04-29 ENCOUNTER — Telehealth: Payer: Self-pay | Admitting: Cardiovascular Disease

## 2023-04-29 NOTE — Telephone Encounter (Signed)
Patient is calling receive her results

## 2023-05-01 NOTE — Telephone Encounter (Signed)
Pt aware of ct results . Normal heart arteries   Small lung nodules, likely benign, can plan for follow-up noncontrast chest CT in 1 year

## 2023-05-01 NOTE — Telephone Encounter (Signed)
Patient is calling to follow up

## 2023-05-18 ENCOUNTER — Telehealth: Payer: Self-pay

## 2023-05-18 NOTE — Telephone Encounter (Signed)
Call to patient to try to find out why NPSG was cancelled and if she is still trying to pursue implantation of inspire device. No answer, left message asking recipient to call our office number.

## 2023-05-22 ENCOUNTER — Telehealth: Payer: Self-pay | Admitting: Cardiovascular Disease

## 2023-05-22 NOTE — Telephone Encounter (Signed)
Patient states that she needs to schedule the sleep study. She stated that's why the rep told her to do. Please advise

## 2023-05-22 NOTE — Telephone Encounter (Addendum)
The patient states she has two crowns which is the reason why she cannot do the oral appliance. She cancelled her appointment because she thought she was supposed to use her cpap during the sleep study and the sleep lab expected an oral appliance. The patient will call the inspire rep Madelyn Flavors to get an understanding of what needs to happen for her sleep study and call our office back.

## 2023-05-26 NOTE — Telephone Encounter (Signed)
I am meeting with Angelique Blonder at Dr. Jenne Pane office today to review several Inspire candidates who have passed DISE with Dr. Jenne Pane but were in need of updated sleep studies.    Laurelei Lambie DOB Nov 30, 2060 - Patient has [employer], and they are the only payer who does require an In-Lab PSG.  They will not accept home sleep studies for Inspire qualification/approval.  The patient previously had a home study in October of 2023.    [employer] is removing the need for OAT trial starting March 29, 2023; however, they will still require an in-lab study.  Has the patient had an in-lab study or could she be scheduled for one if she does still want to move forward with Inspire?  Once this has been completed (or if one has been done in lab within the past 5 years) we can work on getting the patient approved for implantation.  Thanks so much,  [employer] DR Ernest Mallick she needs to complete a PSG with NO oral appliance - please schedule  PSG ordered  Precert pending

## 2023-06-01 ENCOUNTER — Ambulatory Visit: Payer: Commercial Managed Care - PPO | Admitting: Cardiovascular Disease

## 2023-06-09 ENCOUNTER — Ambulatory Visit (HOSPITAL_BASED_OUTPATIENT_CLINIC_OR_DEPARTMENT_OTHER): Payer: Commercial Managed Care - PPO | Attending: Cardiology | Admitting: Cardiology

## 2023-06-09 VITALS — Ht 67.5 in | Wt 191.0 lb

## 2023-06-09 DIAGNOSIS — R0683 Snoring: Secondary | ICD-10-CM | POA: Diagnosis present

## 2023-06-09 DIAGNOSIS — I480 Paroxysmal atrial fibrillation: Secondary | ICD-10-CM | POA: Diagnosis not present

## 2023-06-10 ENCOUNTER — Telehealth: Payer: Self-pay

## 2023-06-10 NOTE — Procedures (Signed)
       Patient Name: Samantha Terrell, Samantha Terrell Date: 06/09/2023 Gender: Female D.O.B: Aug 08, 1960 Age (years): 21 Referring Provider: Armanda Magic MD, ABSM Height (inches): 68 Interpreting Physician: Armanda Magic MD, ABSM Weight (lbs): 191 RPSGT: Armen Pickup BMI: 29 MRN: [id number] Neck Size: 13.00  CLINICAL INFORMATION Sleep Study Type: NPSG  Indication for sleep study: Hypertension  Epworth Sleepiness Score: 3  SLEEP STUDY TECHNIQUE As per the AASM Manual for the Scoring of Sleep and Associated Events v2.3 (April 2016) with a hypopnea requiring 4% desaturations.  The channels recorded and monitored were frontal, central and occipital EEG, electrooculogram (EOG), submentalis EMG (chin), nasal and oral airflow, thoracic and abdominal wall motion, anterior tibialis EMG, snore microphone, electrocardiogram, and pulse oximetry.  MEDICATIONS Medications self-administered by patient taken the night of the study : N/A  SLEEP ARCHITECTURE The study was initiated at 10:55:03 PM and ended at 4:56:07 AM.  Sleep onset time was 87.4 minutes and the sleep efficiency was 64.8%. The total sleep time was 234 minutes.  Stage REM latency was 115.0 minutes.  The patient spent 4.5% of the night in stage N1 sleep, 75.0% in stage N2 sleep, 17.5% in stage N3 and 3% in REM.  Alpha intrusion was absent.  Supine sleep was 0.00%.  RESPIRATORY PARAMETERS The overall apnea/hypopnea index (AHI) was 0.5 per hour. There were 0 total apneas, including 0 obstructive, 0 central and 0 mixed apneas. There were 2 hypopneas and 17 RERAs.  The AHI during Stage REM sleep was 0.0 per hour.  AHI while supine was N/A per hour.  The mean oxygen saturation was 93.0%. The minimum SpO2 during sleep was 89.0%.  loud snoring was noted during this study.  CARDIAC DATA The 2 lead EKG demonstrated sinus rhythm. The mean heart rate was 70.4 beats per minute. Other EKG findings include: None.  LEG MOVEMENT  DATA The total PLMS were 0 with a resulting PLMS index of 0.0. Associated arousal with leg movement index was 0.0 .  IMPRESSIONS - No significant obstructive sleep apnea occurred during this study (AHI = 0.5/h). - The patient had minimal or no oxygen desaturation during the study (Min O2 = 89.0%) - The patient snored with loud snoring volume. - No cardiac abnormalities were noted during this study. - Clinically significant periodic limb movements did not occur during sleep. No significant associated arousals.  DIAGNOSIS - Normal Sleep Study  RECOMMENDATIONS - Avoid alcohol, sedatives and other CNS depressants that may worsen sleep apnea and disrupt normal sleep architecture. - Sleep hygiene should be reviewed to assess factors that may improve sleep quality. - Weight management and regular exercise should be initiated or continued if appropriate.  [Electronically signed] 06/10/2023 10:48 AM  Armanda Magic MD, ABSM Diplomate, American Board of Sleep Medicine

## 2023-06-10 NOTE — Telephone Encounter (Signed)
-----   Message from Armanda Magic sent at 06/10/2023 10:49 AM EST ----- Please let patient know that sleep study showed no significant sleep apnea.

## 2023-06-10 NOTE — Telephone Encounter (Signed)
Notified patient of sleep study results and recommendations. All questions were answered and patient verbalized understanding.

## 2023-06-23 ENCOUNTER — Ambulatory Visit: Payer: Commercial Managed Care - PPO | Attending: Cardiovascular Disease | Admitting: Cardiovascular Disease

## 2023-06-23 ENCOUNTER — Encounter: Payer: Self-pay | Admitting: Cardiovascular Disease

## 2023-06-23 VITALS — BP 134/84 | HR 72 | Ht 67.5 in | Wt 190.2 lb

## 2023-06-23 DIAGNOSIS — I480 Paroxysmal atrial fibrillation: Secondary | ICD-10-CM

## 2023-06-23 NOTE — Patient Instructions (Signed)

## 2023-06-23 NOTE — Progress Notes (Signed)
  Electrophysiology Office Note:    Date:  06/23/2023   ID:  Samantha Terrell, DOB [birth year], MRN [id number]  PCP:  Judd Gaudier, NP   [facility] Providers Cardiologist:  None Electrophysiologist:  Maurice Small, MD     Referring MD: Judd Gaudier, NP   History of Present Illness:    Samantha Terrell is a 62 y.o. female with a hx as listed below significant for atrial fibrillation symptomatic PVCs referred to discuss arrhythmia management.  She wore an event monitor in 2016 for palpitations that just showed occasional to be PVCs that correlated with her symptom of skipped beats. Structural heart disease was excluded, and it appears she was lost to follow-up.  She presented to her PCP in August complaining of palpitations and fatigue.  ECG from August 30 showed atrial fibrillation.  She was referred to Dr. Bjorn Pippin who started Eliquis and metoprolol and placed a Zio patch which showed an atrial fibrillation burden of 21% with, on average, controlled rates.  Coronary CT ordered to evaluate dyspnea showed no evidence of CAD.  Today, she reports that she is doing very well.  She had an A-fib ablation in February.  She has not had symptoms of recurrence    EKGs/Labs/Other Studies Reviewed Today:     7 day monitor: Sinus rhythm heart rate 49 to 120 bpm.  A-fib recurred 21% of the time (49-227 bpm, average 77 bpm). One patient-triggered event was associated with sinus rhythm  Coronary CTA 04/2022 CADRAD 0  EKG:  Last EKG results: today -sinus rhythm   Recent Labs: 04/01/2023: ALT 29; BUN 9; Creat 0.78; Hemoglobin 13.4; Magnesium 2.0; Platelets 267; Potassium 4.5; Sodium 142; TSH 1.12     Physical Exam:    VS:  BP 134/84 (BP Location: Left Arm, Patient Position: Sitting, Cuff Size: Large)   Pulse 72   Ht 5' 7.5" (1.715 m)   Wt 190 lb 3.2 oz (86.3 kg)   LMP  (LMP Unknown)   SpO2 95%   BMI 29.35 kg/m     Pulse 67  Wt Readings from Last 3 Encounters:   06/23/23 190 lb 3.2 oz (86.3 kg)  06/09/23 191 lb (86.6 kg)  04/01/23 192 lb (87.1 kg)     GEN: Well nourished, well developed in no acute distress CARDIAC: Irregular rhythm, no murmurs, rubs, gallops RESPIRATORY:  Normal work of breathing MUSCULOSKELETAL: no edema    ASSESSMENT & PLAN:    Atrial fibrillation:  paroxysmal, symptomatic.  Status post ablation on Sep 05, 2022 Maintaining sinus rhythm Continue metoprolol XL 25  Secondary hypercoagulable state Continue apixaban 5 mg   Hypertension:  BP controlled today    Medication Adjustments/Labs and Tests Ordered: Current medicines are reviewed at length with the patient today.  Concerns regarding medicines are outlined above.  Orders Placed This Encounter  Procedures   EKG 12-Lead   No orders of the defined types were placed in this encounter.    Signed, Maurice Small, MD  06/23/2023 11:56 AM    [facility]

## 2023-07-11 ENCOUNTER — Other Ambulatory Visit: Payer: Self-pay | Admitting: Nurse Practitioner

## 2023-07-11 DIAGNOSIS — I4891 Unspecified atrial fibrillation: Secondary | ICD-10-CM

## 2023-09-16 ENCOUNTER — Other Ambulatory Visit: Payer: Self-pay

## 2023-09-16 DIAGNOSIS — I4891 Unspecified atrial fibrillation: Secondary | ICD-10-CM

## 2023-09-16 MED ORDER — APIXABAN 5 MG PO TABS
5.0000 mg | ORAL_TABLET | Freq: Two times a day (BID) | ORAL | 1 refills | Status: DC
Start: 2023-09-16 — End: 2023-11-12

## 2023-09-22 ENCOUNTER — Other Ambulatory Visit: Payer: Self-pay | Admitting: Cardiology

## 2023-10-01 ENCOUNTER — Ambulatory Visit: Payer: Commercial Managed Care - PPO | Admitting: Nurse Practitioner

## 2023-10-14 ENCOUNTER — Ambulatory Visit: Payer: Commercial Managed Care - PPO | Admitting: Family Medicine

## 2023-10-14 ENCOUNTER — Encounter: Payer: Self-pay | Admitting: Family Medicine

## 2023-10-14 VITALS — BP 140/80 | HR 85 | Temp 98.0°F | Ht 67.5 in | Wt 189.0 lb

## 2023-10-14 DIAGNOSIS — I10 Essential (primary) hypertension: Secondary | ICD-10-CM

## 2023-10-14 DIAGNOSIS — I480 Paroxysmal atrial fibrillation: Secondary | ICD-10-CM

## 2023-10-14 DIAGNOSIS — I83891 Varicose veins of right lower extremities with other complications: Secondary | ICD-10-CM

## 2023-10-14 DIAGNOSIS — R739 Hyperglycemia, unspecified: Secondary | ICD-10-CM

## 2023-10-14 DIAGNOSIS — E663 Overweight: Secondary | ICD-10-CM

## 2023-10-14 DIAGNOSIS — F411 Generalized anxiety disorder: Secondary | ICD-10-CM | POA: Diagnosis not present

## 2023-10-14 DIAGNOSIS — Z6829 Body mass index (BMI) 29.0-29.9, adult: Secondary | ICD-10-CM

## 2023-10-14 DIAGNOSIS — F331 Major depressive disorder, recurrent, moderate: Secondary | ICD-10-CM

## 2023-10-14 MED ORDER — LOSARTAN POTASSIUM 50 MG PO TABS
50.0000 mg | ORAL_TABLET | Freq: Every day | ORAL | 6 refills | Status: AC
Start: 2023-10-14 — End: ?

## 2023-10-14 NOTE — Assessment & Plan Note (Signed)
 Continue current medication regimen

## 2023-10-14 NOTE — Assessment & Plan Note (Signed)
 Continue current medication regimen.  Follow up with cardiology as scheduled

## 2023-10-14 NOTE — Patient Instructions (Signed)

## 2023-10-14 NOTE — Progress Notes (Signed)
 New Patient Office Visit  Subjective    Patient ID: Samantha Terrell, female    DOB: 06-May-1961  Age: 63 y.o. MRN: [id number]  CC:  Chief Complaint  Patient presents with   Establish Care    HPI [name] presents to establish care today. Has medical history consisting of hypertension, depression, anxiety, A-fib, varicose veins, vitamin D deficiency. Reports compliance with medication regimen. She is up-to-date on routine screenings. She is up-to-date on routine vaccines. She receives regular dental and eye care. She sees Dr. Nelly Laurence with cardiology. Requesting referrals to vascular and vein for eval of varicose veins to the right lower leg. Has been taking ashwagandha Gummies to try to help lose weight. States she is lost 5 pounds in about the last month. Denies other concerns today. She is fasting today. Medical history as outlined below.  Outpatient Encounter Medications as of 10/14/2023  Medication Sig   apixaban (ELIQUIS) 5 MG TABS tablet Take 1 tablet (5 mg total) by mouth 2 (two) times daily.   ASHWAGANDHA PO Take by mouth.   magnesium (MAGTAB) 84 MG ( ) TBCR SR tablet Take 84 mg by mouth daily. Pt states she takes it 3 times a day. Pt is not sure about the dosage.   metoprolol succinate (TOPROL-XL) 25 MG 24 hr tablet Take 1 tablet (25 mg total) by mouth 2 (two) times daily.   norethindrone-ethinyl estradiol (FYAVOLV) 1-5 MG-MCG TABS tablet Take 1 tablet by mouth daily.   OVER THE COUNTER MEDICATION Take 5,000 mcg by mouth 2 (two) times a day. DHT blocker   sertraline (ZOLOFT) 50 MG tablet Take 50 mg by mouth daily with lunch.   Vitamin D, Cholecalciferol, 1000 UNITS CAPS Take 1,000 Units by mouth at bedtime.   [DISCONTINUED] losartan (COZAAR) 50 MG tablet TAKE 1 TABLET BY MOUTH EVERY DAY   ciclopirox (LOPROX) 0.77 % cream Apply topically 2 (two) times daily. (Patient not taking: Reported on 10/14/2023)   cyanocobalamin (VITAMIN B12) 500 MCG tablet Take 500  mcg by mouth daily. (Patient not taking: Reported on 10/14/2023)   losartan (COZAAR) 50 MG tablet Take 1 tablet (50 mg total) by mouth daily.   pantoprazole (PROTONIX) 40 MG tablet Take 1 tablet (40 mg total) by mouth daily. (Patient not taking: Reported on 12/30/2022)   Turmeric 450 MG CAPS Take 450 mg by mouth daily. (Patient not taking: Reported on 04/01/2023)   No facility-administered encounter medications on file as of 10/14/2023.    Past Medical History:  Diagnosis Date   Atrial fibrillation (HCC)    Degenerative disc disease, cervical    Depression    Dyspnea    Family history of adverse reaction to anesthesia    mother-- ponv   GAD (generalized anxiety disorder)    History of adenomatous polyp of colon    History of COVID-19 11/2020   per pt mild symptoms that resolved   Hypertension    PVC (premature ventricular contraction)    symptomatic intermittant palpitations  (event monitor in epic 12-03-2014 showed SR w/ PVCs)   Sleep apnea    SUI (stress urinary incontinence, female)     Past Surgical History:  Procedure Laterality Date   ATRIAL FIBRILLATION ABLATION N/A 09/05/2022   Procedure: ATRIAL FIBRILLATION ABLATION;  Surgeon: Maurice Small, MD;  Location: [facility];  Service: Cardiovascular;  Laterality: N/A;   BLADDER SUSPENSION N/A 11/06/2021   Procedure: TRANSVAGINAL TAPE (TVT) PROCEDURE;  Surgeon: Carrington Clamp, MD;  Location: [facility];  Service: Gynecology;  Laterality: N/A;   COLONOSCOPY WITH PROPOFOL  06/07/2021   by Dr.Beavers   CYSTOCELE REPAIR N/A 11/06/2021   Procedure: ANTERIOR REPAIR (CYSTOCELE);  Surgeon: Carrington Clamp, MD;  Location: [facility];  Service: Gynecology;  Laterality: N/A;   CYSTOSCOPY N/A 11/06/2021   Procedure: CYSTOSCOPY;  Surgeon: Carrington Clamp, MD;  Location: [facility];  Service: Gynecology;  Laterality: N/A;   DRUG INDUCED ENDOSCOPY Bilateral 01/06/2023   Procedure: DRUG  INDUCED SLEEP ENDOSCOPY;  Surgeon: Christia Reading, MD;  Location: [facility];  Service: ENT;  Laterality: Bilateral;   NASAL SEPTUM SURGERY  1986    Family History  Problem Relation Age of Onset   Arthritis Mother        bilateral knees   Hyperlipidemia Father    Cancer Maternal Grandmother        cervical cancer   Osteoporosis Maternal Grandmother    Heart attack Maternal Grandmother        age 37   Congestive Heart Failure Maternal Grandmother        age 73   Arthritis Maternal Grandfather        Severe, bilateral hands   Vascular Disease Maternal Grandfather        smoker, PAD   Osteoporosis Paternal Grandmother    Colon cancer Neg Hx    Breast cancer Neg Hx    Colon polyps Neg Hx    Esophageal cancer Neg Hx    Rectal cancer Neg Hx    Stomach cancer Neg Hx     Social History   Socioeconomic History   Marital status: Married    Spouse name: Not on file   Number of children: Not on file   Years of education: Not on file   Highest education level: Not on file  Occupational History   Not on file  Tobacco Use   Smoking status: Never   Smokeless tobacco: Never   Tobacco comments:    Never smoke 10/03/22  Vaping Use   Vaping status: Never Used  Substance and Sexual Activity   Alcohol use: Yes    Alcohol/week: 4.0 - 5.0 standard drinks of alcohol    Types: 4 - 5 Glasses of wine per week    Comment: 4-5 glasses of wine weekly 10/03/22   Drug use: Never   Sexual activity: Yes    Partners: Male    Birth control/protection: Post-menopausal  Other Topics Concern   Not on file  Social History Narrative   Not on file   Social Drivers of Health   Financial Resource Strain: Not on file  Food Insecurity: Not on file  Transportation Needs: Not on file  Physical Activity: Sufficiently Active (12/03/2017)   Exercise Vital Sign    Days of Exercise per Week: 4 days    Minutes of Exercise per Session: 50 min  Stress: No Stress Concern Present (12/03/2017)   [employer] of  Occupational Health - Occupational Stress Questionnaire    Feeling of Stress : Only a little  Social Connections: Not on file  Intimate Partner Violence: Not on file    ROS Per HPI      Objective    BP (!) 140/80 (BP Location: Left Arm, Patient Position: Sitting)   Pulse 85   Temp 98 F (36.7 C) (Temporal)   Ht 5' 7.5" (1.715 m)   Wt 189 lb (85.7 kg)   LMP  (LMP Unknown)   SpO2 96%   BMI 29.16  kg/m   Physical Exam Vitals and nursing note reviewed.  Constitutional:      General: She is not in acute distress.    Appearance: Normal appearance.  HENT:     Head: Normocephalic and atraumatic.     Nose: Nose normal.  Eyes:     Extraocular Movements: Extraocular movements intact.     Pupils: Pupils are equal, round, and reactive to light.  Cardiovascular:     Rate and Rhythm: Normal rate and regular rhythm.     Heart sounds: Normal heart sounds.  Pulmonary:     Effort: Pulmonary effort is normal. No respiratory distress.     Breath sounds: Normal breath sounds. No wheezing, rhonchi or rales.  Musculoskeletal:        General: Normal range of motion.     Cervical back: Normal range of motion.  Lymphadenopathy:     Cervical: No cervical adenopathy.  Skin:    Comments: Varicose veins to R lower extremity  Neurological:     General: No focal deficit present.     Mental Status: She is alert and oriented to person, place, and time.  Psychiatric:        Mood and Affect: Mood normal.        Thought Content: Thought content normal.         Assessment & Plan:   Primary hypertension Assessment & Plan: Continue current medication regimen.  Orders: -     CBC with Differential/Platelet -     Lipid panel -     Losartan Potassium; Take 1 tablet (50 mg total) by mouth daily.  Dispense: 30 tablet; Refill: 6  Paroxysmal atrial fibrillation (HCC) Assessment & Plan: Continue current medication regimen.  Follow up with cardiology as scheduled  Orders: -      Comprehensive metabolic panel -     Lipid panel  MDD (major depressive disorder), recurrent episode, moderate (HCC) Assessment & Plan: Continue current medication regimen.   GAD (generalized anxiety disorder) Assessment & Plan: Continue current medication regimen.   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   Moshe Cipro, FNP

## 2023-10-15 ENCOUNTER — Encounter: Payer: Self-pay | Admitting: Family Medicine

## 2023-10-15 LAB — CBC WITH DIFFERENTIAL/PLATELET
Basophils Absolute: 0.1 10*3/uL (ref 0.0–0.1)
Basophils Relative: 0.8 % (ref 0.0–3.0)
Eosinophils Absolute: 0.1 10*3/uL (ref 0.0–0.7)
Eosinophils Relative: 1.6 % (ref 0.0–5.0)
HCT: 39.6 % (ref 36.0–46.0)
Hemoglobin: 13.5 g/dL (ref 12.0–15.0)
Lymphocytes Relative: 28.7 % (ref 12.0–46.0)
Lymphs Abs: 2.2 10*3/uL (ref 0.7–4.0)
MCHC: 34 g/dL (ref 30.0–36.0)
MCV: 91.5 fl (ref 78.0–100.0)
Monocytes Absolute: 0.8 10*3/uL (ref 0.1–1.0)
Monocytes Relative: 10.3 % (ref 3.0–12.0)
Neutro Abs: 4.4 10*3/uL (ref 1.4–7.7)
Neutrophils Relative %: 58.6 % (ref 43.0–77.0)
Platelets: 247 10*3/uL (ref 150.0–400.0)
RBC: 4.33 Mil/uL (ref 3.87–5.11)
RDW: 13 % (ref 11.5–15.5)
WBC: 7.5 10*3/uL (ref 4.0–10.5)

## 2023-10-15 LAB — LIPID PANEL
Cholesterol: 199 mg/dL (ref 0–200)
HDL: 59.6 mg/dL (ref >39.00)
LDL Cholesterol: 122 mg/dL — ABNORMAL HIGH (ref 0–99)
NonHDL: 138.97
Total CHOL/HDL Ratio: 3
Triglycerides: 86 mg/dL (ref 0.0–149.0)
VLDL: 17.2 mg/dL (ref 0.0–40.0)

## 2023-10-15 LAB — COMPREHENSIVE METABOLIC PANEL
ALT: 31 U/L (ref 0–35)
AST: 37 U/L (ref 0–37)
Albumin: 4.5 g/dL (ref 3.5–5.2)
Alkaline Phosphatase: 85 U/L (ref 39–117)
BUN: 11 mg/dL (ref 6–23)
CO2: 27 meq/L (ref 19–32)
Calcium: 9.7 mg/dL (ref 8.4–10.5)
Chloride: 102 meq/L (ref 96–112)
Creatinine, Ser: 0.74 mg/dL (ref 0.40–1.20)
GFR: 87.5 mL/min (ref >60.00)
Glucose, Bld: 93 mg/dL (ref 70–99)
Potassium: 4.7 meq/L (ref 3.5–5.1)
Sodium: 137 meq/L (ref 135–145)
Total Bilirubin: 0.6 mg/dL (ref 0.2–1.2)
Total Protein: 7.4 g/dL (ref 6.0–8.3)

## 2023-10-15 LAB — HEMOGLOBIN A1C: Hgb A1c MFr Bld: 5.8 % (ref 4.6–6.5)

## 2023-11-08 ENCOUNTER — Other Ambulatory Visit: Payer: Self-pay | Admitting: Family

## 2023-11-08 DIAGNOSIS — I4891 Unspecified atrial fibrillation: Secondary | ICD-10-CM

## 2023-11-12 ENCOUNTER — Other Ambulatory Visit: Payer: Self-pay

## 2023-11-12 DIAGNOSIS — I4891 Unspecified atrial fibrillation: Secondary | ICD-10-CM

## 2023-11-12 MED ORDER — APIXABAN 5 MG PO TABS
5.0000 mg | ORAL_TABLET | Freq: Two times a day (BID) | ORAL | 1 refills | Status: DC
Start: 2023-11-12 — End: 2024-01-15

## 2023-12-21 ENCOUNTER — Other Ambulatory Visit: Payer: Self-pay

## 2023-12-21 ENCOUNTER — Emergency Department (HOSPITAL_BASED_OUTPATIENT_CLINIC_OR_DEPARTMENT_OTHER)

## 2023-12-21 ENCOUNTER — Emergency Department (HOSPITAL_BASED_OUTPATIENT_CLINIC_OR_DEPARTMENT_OTHER): Admission: EM | Admit: 2023-12-21 | Discharge: 2023-12-22 | Disposition: A | Discharge status: Home / Self Care

## 2023-12-21 DIAGNOSIS — I10 Essential (primary) hypertension: Secondary | ICD-10-CM | POA: Diagnosis not present

## 2023-12-21 DIAGNOSIS — Z79899 Other long term (current) drug therapy: Secondary | ICD-10-CM | POA: Insufficient documentation

## 2023-12-21 DIAGNOSIS — Z7901 Long term (current) use of anticoagulants: Secondary | ICD-10-CM | POA: Insufficient documentation

## 2023-12-21 DIAGNOSIS — Z91040 Latex allergy status: Secondary | ICD-10-CM | POA: Diagnosis not present

## 2023-12-21 DIAGNOSIS — K8020 Calculus of gallbladder without cholecystitis without obstruction: Secondary | ICD-10-CM | POA: Diagnosis not present

## 2023-12-21 DIAGNOSIS — R109 Unspecified abdominal pain: Secondary | ICD-10-CM | POA: Diagnosis present

## 2023-12-21 DIAGNOSIS — K529 Noninfective gastroenteritis and colitis, unspecified: Secondary | ICD-10-CM | POA: Diagnosis not present

## 2023-12-21 LAB — COMPREHENSIVE METABOLIC PANEL WITH GFR
ALT: 31 U/L (ref 0–44)
AST: 61 U/L — ABNORMAL HIGH (ref 15–41)
Albumin: 4.5 g/dL (ref 3.5–5.0)
Alkaline Phosphatase: 108 U/L (ref 38–126)
Anion gap: 17 — ABNORMAL HIGH (ref 5–15)
BUN: 9 mg/dL (ref 8–23)
CO2: 24 mmol/L (ref 22–32)
Calcium: 9.8 mg/dL (ref 8.9–10.3)
Chloride: 100 mmol/L (ref 98–111)
Creatinine, Ser: 0.72 mg/dL (ref 0.44–1.00)
GFR, Estimated: >60 mL/min (ref >60)
Glucose, Bld: 129 mg/dL — ABNORMAL HIGH (ref 70–99)
Potassium: 3.8 mmol/L (ref 3.5–5.1)
Sodium: 140 mmol/L (ref 135–145)
Total Bilirubin: 0.8 mg/dL (ref 0.0–1.2)
Total Protein: 7.6 g/dL (ref 6.5–8.1)

## 2023-12-21 LAB — CBC WITH DIFFERENTIAL/PLATELET
Abs Immature Granulocytes: 0.03 10*3/uL (ref 0.00–0.07)
Basophils Absolute: 0 10*3/uL (ref 0.0–0.1)
Basophils Relative: 0 %
Eosinophils Absolute: 0 10*3/uL (ref 0.0–0.5)
Eosinophils Relative: 0 %
HCT: 40.7 % (ref 36.0–46.0)
Hemoglobin: 13.3 g/dL (ref 12.0–15.0)
Immature Granulocytes: 0 %
Lymphocytes Relative: 20 %
Lymphs Abs: 2.3 10*3/uL (ref 0.7–4.0)
MCH: 30.2 pg (ref 26.0–34.0)
MCHC: 32.7 g/dL (ref 30.0–36.0)
MCV: 92.5 fL (ref 80.0–100.0)
Monocytes Absolute: 1 10*3/uL (ref 0.1–1.0)
Monocytes Relative: 9 %
Neutro Abs: 8.1 10*3/uL — ABNORMAL HIGH (ref 1.7–7.7)
Neutrophils Relative %: 71 %
Platelets: 255 10*3/uL (ref 150–400)
RBC: 4.4 MIL/uL (ref 3.87–5.11)
RDW: 13.1 % (ref 11.5–15.5)
WBC: 11.6 10*3/uL — ABNORMAL HIGH (ref 4.0–10.5)
nRBC: 0 % (ref 0.0–0.2)

## 2023-12-21 LAB — URINALYSIS, ROUTINE W REFLEX MICROSCOPIC
Bacteria, UA: NONE SEEN
Bilirubin Urine: NEGATIVE
Glucose, UA: NEGATIVE mg/dL
Hgb urine dipstick: NEGATIVE
Nitrite: NEGATIVE
Protein, ur: NEGATIVE mg/dL
Specific Gravity, Urine: 1.016 (ref 1.005–1.030)
pH: 6 (ref 5.0–8.0)

## 2023-12-21 LAB — LIPASE, BLOOD: Lipase: 75 U/L — ABNORMAL HIGH (ref 11–51)

## 2023-12-21 MED ORDER — SODIUM CHLORIDE 0.9 % IV BOLUS
1000.0000 mL | Freq: Once | INTRAVENOUS | Status: AC
  Administered 2023-12-21: 1000 mL via INTRAVENOUS

## 2023-12-21 NOTE — ED Triage Notes (Signed)
 Pt POV after possible food poisoning yesterday, endorses nvd that resolved today but then returned after eating a big meal.

## 2023-12-21 NOTE — ED Provider Notes (Signed)
 [place] EMERGENCY DEPARTMENT AT [facility] Provider Note   CSN: [id number] Arrival date & time: 12/21/23  2229     History  Chief Complaint  Patient presents with   Emesis    Samantha Terrell is a 63 y.o. female who presents with a 24-hour history of upper abdominal pain as well as multiple episodes of vomiting, and multiple episodes of loose stools.  She denies any hematemesis, denies any hematochezia or melena.  States that she was eating at a restaurant in [place], within several hours after began to have upper abdominal cramping and multiple episodes of nausea and vomiting.  This improved over Sunday and this morning.  She resumed more normal diet and later this evening began to have resumption of abdominal pain, nausea and vomiting, and loose stools.  She denies any lightheadedness or dizziness, denies any chest pain, did endorse an episode of shortness of breath that was due to pain with deep inspiration in the upper abdomen.  Notably her previously diagnoses shows a history of hypertension, anxiety, hyperlipidemia, atrial fibrillation.  Is been consistently adherent to her medications and has no concerns with that at this time.   Emesis Associated symptoms: abdominal pain and diarrhea   Associated symptoms: no chills and no fever        Home Medications Prior to Admission medications   Medication Sig Start Date End Date Taking? Authorizing Provider  apixaban  (ELIQUIS ) 5 MG TABS tablet Take 1 tablet (5 mg total) by mouth 2 (two) times daily. 11/12/23   Wellington Half, FNP  ASHWAGANDHA PO Take by mouth.    [provider]  ciclopirox  (LOPROX ) 0.77 % cream Apply topically 2 (two) times daily. Patient not taking: Reported on 10/14/2023 12/14/22   Cranford, Tonya, NP  cyanocobalamin (VITAMIN B12) 500 MCG tablet Take 500 mcg by mouth daily. Patient not taking: Reported on 10/14/2023    [provider]  losartan  (COZAAR ) 50 MG tablet  Take 1 tablet (50 mg total) by mouth daily. 10/14/23   Wellington Half, FNP  magnesium (MAGTAB) 84 MG ( ) TBCR SR tablet Take 84 mg by mouth daily. Pt states she takes it 3 times a day. Pt is not sure about the dosage.    [provider]  metoprolol  succinate (TOPROL -XL) 25 MG 24 hr tablet Take 1 tablet (25 mg total) by mouth 2 (two) times daily. 03/03/23   Fenton, Clint R, PA  norethindrone-ethinyl estradiol  (FYAVOLV) 1-5 MG-MCG TABS tablet Take 1 tablet by mouth daily.    [provider]  OVER THE COUNTER MEDICATION Take 5,000 mcg by mouth 2 (two) times a day. DHT blocker    [provider]  pantoprazole  (PROTONIX ) 40 MG tablet Take 1 tablet (40 mg total) by mouth daily. Patient not taking: Reported on 12/30/2022 09/05/22 10/05/22  Debbie Fails, PA-C  sertraline (ZOLOFT) 50 MG tablet Take 50 mg by mouth daily with lunch. 11/19/14   [provider]  Turmeric 450 MG CAPS Take 450 mg by mouth daily. Patient not taking: Reported on 04/01/2023    [provider]  Vitamin D , Cholecalciferol, 1000 UNITS CAPS Take 1,000 Units by mouth at bedtime.    [provider]      Allergies    Latex    Review of Systems   Review of Systems  Constitutional:  Positive for appetite change. Negative for chills, fatigue and fever.  Gastrointestinal:  Positive for abdominal distention, abdominal pain, diarrhea, nausea and vomiting. Negative  for blood in stool.  Neurological:  Negative for dizziness and light-headedness.  All other systems reviewed and are negative.   Physical Exam Updated Vital Signs BP (!) 160/77   Pulse 88   Temp 98.6 F (37 C) (Oral)   Resp 16   Ht 5\' 7"  (1.702 m)   Wt 85.7 kg   LMP  (LMP Unknown)   SpO2 100%   BMI 29.60 kg/m  Physical Exam Vitals and nursing note reviewed.  Constitutional:      General: She is not in acute distress.    Appearance: Normal appearance. She is ill-appearing.  HENT:     Head: Normocephalic  and atraumatic.     Mouth/Throat:     Mouth: Mucous membranes are moist.     Pharynx: Oropharynx is clear.  Eyes:     Extraocular Movements: Extraocular movements intact.     Conjunctiva/sclera: Conjunctivae normal.     Pupils: Pupils are equal, round, and reactive to light.  Cardiovascular:     Rate and Rhythm: Normal rate and regular rhythm.     Pulses: Normal pulses.     Heart sounds: Normal heart sounds. No murmur heard.    No friction rub. No gallop.  Pulmonary:     Effort: Pulmonary effort is normal.     Breath sounds: Normal breath sounds and air entry.  Abdominal:     General: Abdomen is flat. Bowel sounds are normal.     Palpations: Abdomen is soft. There is no hepatomegaly or splenomegaly.     Tenderness: There is abdominal tenderness. There is no guarding. Positive signs include Murphy's sign. Negative signs include McBurney's sign.  Musculoskeletal:        General: Normal range of motion.     Cervical back: Normal range of motion and neck supple.     Right lower leg: No edema.     Left lower leg: No edema.  Skin:    General: Skin is warm and dry.     Capillary Refill: Capillary refill takes less than 2 seconds.  Neurological:     General: No focal deficit present.     Mental Status: She is alert. Mental status is at baseline.  Psychiatric:        Mood and Affect: Mood normal.     ED Results / Procedures / Treatments   Labs (all labs ordered are listed, but only abnormal results are displayed) Labs Reviewed  CBC WITH DIFFERENTIAL/PLATELET - Abnormal; Notable for the following components:      Result Value   WBC 11.6 (*)    Neutro Abs 8.1 (*)    All other components within normal limits  COMPREHENSIVE METABOLIC PANEL WITH GFR - Abnormal; Notable for the following components:   Glucose, Bld 129 (*)    AST 61 (*)    Anion gap 17 (*)    All other components within normal limits  LIPASE, BLOOD - Abnormal; Notable for the following components:   Lipase 75 (*)     All other components within normal limits  CBC WITH DIFFERENTIAL/PLATELET  URINALYSIS, ROUTINE W REFLEX MICROSCOPIC    EKG EKG Interpretation Date/Time:  Monday Dec 21 2023 22:39:45 EDT Ventricular Rate:  92 PR Interval:  139 QRS Duration:  87 QT Interval:  361 QTC Calculation: 447 R Axis:   75  Text Interpretation: Sinus rhythm Confirmed by Abner Hoffman [phone]) on 12/21/2023 10:41:56 PM  Radiology No results found.  Procedures Procedures    Medications Ordered in ED Medications  sodium chloride  0.9 % bolus 1,000 mL (has no administration in time range)    ED Course/ Medical Decision Making/ A&P                                 Medical Decision Making Amount and/or Complexity of Data Reviewed Labs: ordered. Radiology: ordered.   Medical Decision Making:   Samantha Terrell is a 63 y.o. female who presented to the ED today with upper abdominal pain and nausea/vomiting detailed above.     Complete initial physical exam performed, notably the patient  was alert and oriented no apparent distress but obviously ill-appearing.  Active bowel sounds noted, tenderness to palpation of the right upper quadrant of the abdomen along with a positive Murphy sign. Reviewed and confirmed nursing documentation for past medical history, family history, social history.    Initial Assessment:   With the patient's presentation of upper abdominal pain, most likely diagnosis is cholelithiasis/cholecystitis; also considering possibility of gastritis/colitis. Other diagnoses were considered including (but not limited to) pancreatitis, mesenteric ischemia, gastric ulceration. These are considered less likely due to history of present illness and physical exam findings.     Initial Plan:  Ultrasound of right upper quadrant to assess for presence of cholelithiasis. Screening labs including CBC and Metabolic panel to evaluate for infectious or metabolic etiology of disease.  Serum lipase to  assess for pancreatic etiology Urinalysis with reflex culture ordered to evaluate for UTI or relevant urologic/nephrologic pathology.  EKG to evaluate for cardiac pathology Provide IV fluid bolus to manage fluid deficit. Objective evaluation as below reviewed   Initial Study Results:   Laboratory  All laboratory results reviewed without evidence of clinically relevant pathology.   Exceptions include: Elevated lipase at 75, mild leukocytosis at 11.6, elevated AST at 61  EKG EKG was reviewed independently. Rate, rhythm, axis, intervals all examined and without medically relevant abnormality. ST segments without concerns for elevations.    Radiology:  Pending ultrasound results at time of shift change.    Reassessment and Plan:   Based on clinical assessment patient along with lab workup, at this time differential diagnosis is between potential cholecystitis/cholelithiasis versus gastroenteritis.  Patient has received 1 L of IV fluids, plan is to reassess and review findings of ultrasound.  Anticipate patient stable for discharge and follow-up with appropriate physician.  Case signed off to nighttime provider.          Final Clinical Impression(s) / ED Diagnoses Final diagnoses:  None    Rx / DC Orders ED Discharge Orders     None         Juanetta Nordmann, PA 12/22/23 0001    Orvilla Blander, MD 12/22/23 [phone]

## 2023-12-22 MED ORDER — HYDROCODONE-ACETAMINOPHEN 5-325 MG PO TABS: 1.0000 | ORAL_TABLET | ORAL | 0 refills | Status: DC | PRN

## 2023-12-22 NOTE — Discharge Instructions (Signed)
 Begin taking hydrocodone as prescribed as needed for pain.  Follow-up with general surgery in the next 2 to 3 days.  The contact information for [facility] surgery has been provided in this discharge summary for you to call and make these arrangements.  Return to the ER for worsening pain, high fevers, severe vomiting, or for other new and concerning symptoms.

## 2023-12-24 ENCOUNTER — Telehealth: Payer: Self-pay

## 2023-12-24 NOTE — Telephone Encounter (Signed)
   Pre-operative Risk Assessment    Patient Name: Samantha Terrell  DOB: [birth year] MRN: [id number]   Date of last office visit: 06/23/23 with Dr. Arlester Ladd Date of next office visit: None   Request for Surgical Clearance    Procedure:  Gallbladder Surgery  Date of Surgery:  Clearance TBD                                Surgeon:  Dr. Teddie Favre Surgeon's Group or Practice Name:  [facility]  Phone number:  [phone] Fax number:  [phone] attn: Eduardo Grade, CMA   Type of Clearance Requested:   - Medical  - Pharmacy:  Hold Apixaban  (Eliquis ) not indicated   Type of Anesthesia:  General    Additional requests/questions:    [name]   12/24/2023, 8:29 AM

## 2023-12-24 NOTE — Telephone Encounter (Signed)
   Name: Samantha Terrell  DOB: [birth year]  MRN: [id number]  Primary Cardiologist: None  Chart reviewed as part of pre-operative protocol coverage. Because of Samantha Terrell past medical history and time since last visit, she will require a follow-up in-office visit in order to better assess preoperative cardiovascular risk. Patient is overdue for follow-up with general cardiology.  Pre-op covering staff: - Please schedule appointment and call patient to inform them. If patient already had an upcoming appointment within acceptable timeframe, please add "pre-op clearance" to the appointment notes so provider is aware. - Please contact requesting surgeon's office via preferred method (i.e, phone, fax) to inform them of need for appointment prior to surgery.  This message will also be routed to pharmacy pool for input on holding Eliquis  as requested below so that this information is available to the clearing provider at time of patient's appointment.   Jude Norton, NP  12/24/2023, 8:55 AM

## 2023-12-24 NOTE — Telephone Encounter (Signed)
 Patient with diagnosis of afib on Eliquis  for anticoagulation.    Procedure:  Gallbladder Surgery  Date of procedure: TBD   CHA2DS2-VASc Score = 2   This indicates a 2.2% annual risk of stroke. The patient's score is based upon: CHF History: 0 HTN History: 1 Diabetes History: 0 Stroke History: 0 Vascular Disease History: 0 Age Score: 0 Gender Score: 1       CrCl 92 ml/min Platelet count 255  Patient has not had an Afib/aflutter ablation within the last 3 months or DCCV within the last 30 days  Per office protocol, patient can hold Eliquis  for 2 days prior to procedure.    **This guidance is not considered finalized until pre-operative APP has relayed final recommendations.**

## 2023-12-24 NOTE — Telephone Encounter (Signed)
 Spoke with patient who is agreeable to have an office visit on 6/10 at 8:45 am.

## 2023-12-25 NOTE — Progress Notes (Signed)
  Cardiology Office Note:  .   Date:  01/05/2024  ID:  Samantha Terrell, DOB 1961/04/14, MRN [id number] PCP: Wellington Half, FNP  [facility] Providers Cardiologist:  None    History of Present Illness: Samantha Terrell is a 63 y.o. female with history of PAF s/p ablation 08/2023 on eliquis , HTN, HLD, OSA, coronary CTA 2023 normal coronary arteries.  Patient here for preop clearance for gallbladder surgery. Patient has occasional palpitations but infrequent and don't last long. Didn't tolerate CPAP. Saw ENT and had another sleep study and told she no longer had sleep apnea.She walks on treadmill 30 min 4 times weekly.  ROS:    Studies Reviewed: Aaron Aas    EKG Interpretation Date/Time:  Tuesday January 05 2024 09:06:53 EDT Ventricular Rate:  86 PR Interval:  128 QRS Duration:  78 QT Interval:  360 QTC Calculation: 430 R Axis:   64  Text Interpretation: Normal sinus rhythm Normal ECG When compared with ECG of 21-Dec-2023 22:39, PREVIOUS ECG IS PRESENT Confirmed by Theotis Flake [phone]) on 01/05/2024 9:09:18 AM    Prior CV Studies:   Echocardiogram 05/05/2022 showed normal biventricular function, no significant valvular disease. Coronary CTA on 05/15/2022 showed normal coronary arteries, calcium score 0. Zio patch x 7 days on 05/21/2022 showed 21% A-fib burden with average rate 105 bpm  Risk Assessment/Calculations:    CHA2DS2-VASc Score = 2   This indicates a 2.2% annual risk of stroke. The patient's score is based upon: CHF History: 0 HTN History: 1 Diabetes History: 0 Stroke History: 0 Vascular Disease History: 0 Age Score: 0 Gender Score: 1             Physical Exam:   VS:  BP 126/76   Pulse 86   Ht 5' 7.5" (1.715 m)   Wt 191 lb 9.6 oz (86.9 kg)   LMP  (LMP Unknown)   SpO2 97%   BMI 29.57 kg/m    Wt Readings from Last 3 Encounters:  01/05/24 191 lb 9.6 oz (86.9 kg)  12/21/23 189 lb (85.7 kg)  10/14/23 189 lb (85.7 kg)    GEN: Well nourished, well  developed in no acute distress NECK: No JVD; No carotid bruits CARDIAC:  RRR, no murmurs, rubs, gallops RESPIRATORY:  Clear to auscultation without rales, wheezing or rhonchi  ABDOMEN: Soft, non-tender, non-distended EXTREMITIES:  No edema; No deformity   ASSESSMENT AND PLAN: .    Preop clearance for gallbladder surgery by Dr. Teddie Favre. Pharmacy team has cleared her to hold eliquis  for 2 days prior to surgery. She has history of PAF s/p ablation 08/2022, coronary CTA 04/2022 normal coronary arteries and calcium score 0. Patient at acceptable risk for above surgery.   According to the Revised Cardiac Risk Index (RCRI), her Perioperative Risk of Major Cardiac Event is (%): 0.4  Her Functional Capacity in METs is: 6.7 according to the Duke Activity Status Index (DASI).   PAF on eliquis  s/p ablation 08/2022 maintaining NSR on metoprolol , remains on eliquis .  HTN-BP controlled on cozaar , toprol .  HLD-LDL 122 09/2023- with normal coronary calcium score 0 will hold off on statin. Adjust diet.   OSA-repeat sleep study no sleep apnea per patient        Dispo: f/u in 1 yr.  Signed, Theotis Flake, PA-C

## 2024-01-05 ENCOUNTER — Encounter: Payer: Self-pay | Admitting: Physician Assistant

## 2024-01-05 ENCOUNTER — Ambulatory Visit: Attending: Physician Assistant | Admitting: Physician Assistant

## 2024-01-05 VITALS — BP 126/76 | HR 86 | Ht 67.5 in | Wt 191.6 lb

## 2024-01-05 DIAGNOSIS — Z01818 Encounter for other preprocedural examination: Secondary | ICD-10-CM

## 2024-01-05 DIAGNOSIS — E785 Hyperlipidemia, unspecified: Secondary | ICD-10-CM | POA: Diagnosis not present

## 2024-01-05 DIAGNOSIS — I10 Essential (primary) hypertension: Secondary | ICD-10-CM

## 2024-01-05 DIAGNOSIS — I480 Paroxysmal atrial fibrillation: Secondary | ICD-10-CM | POA: Diagnosis not present

## 2024-01-05 DIAGNOSIS — G4733 Obstructive sleep apnea (adult) (pediatric): Secondary | ICD-10-CM

## 2024-01-05 NOTE — Patient Instructions (Signed)
 Medication Instructions:  Your physician recommends that you continue on your current medications as directed. Please refer to the Current Medication list given to you today.  *If you need a refill on your cardiac medications before your next appointment, please call your pharmacy*  Lab Work: NONE If you have labs (blood work) drawn today and your tests are completely normal, you will receive your results only by: MyChart Message (if you have MyChart) OR A paper copy in the mail If you have any lab test that is abnormal or we need to change your treatment, we will call you to review the results.  Testing/Procedures: NONE  Follow-Up: At [facility], you and your health needs are our priority.  As part of our continuing mission to provide you with exceptional heart care, our providers are all part of one team.  This team includes your primary Cardiologist (physician) and Advanced Practice Providers or APPs (Physician Assistants and Nurse Practitioners) who all work together to provide you with the care you need, when you need it.  Your next appointment:   1 year(s)  Provider:   DR. Alda Amas   We recommend signing up for the patient portal called "MyChart".  Sign up information is provided on this After Visit Summary.  MyChart is used to connect with patients for Virtual Visits (Telemedicine).  Patients are able to view lab/test results, encounter notes, upcoming appointments, etc.  Non-urgent messages can be sent to your provider as well.   To learn more about what you can do with MyChart, go to [email or web address].

## 2024-01-15 ENCOUNTER — Other Ambulatory Visit: Payer: Self-pay | Admitting: Family Medicine

## 2024-01-15 DIAGNOSIS — I4891 Unspecified atrial fibrillation: Secondary | ICD-10-CM

## 2024-01-28 ENCOUNTER — Encounter: Payer: Self-pay | Admitting: Emergency Medicine

## 2024-01-28 ENCOUNTER — Ambulatory Visit (INDEPENDENT_AMBULATORY_CARE_PROVIDER_SITE_OTHER): Admitting: Emergency Medicine

## 2024-01-28 ENCOUNTER — Ambulatory Visit: Payer: Self-pay | Admitting: Emergency Medicine

## 2024-01-28 ENCOUNTER — Ambulatory Visit

## 2024-01-28 VITALS — BP 130/88 | HR 85 | Temp 98.1°F | Ht 67.5 in | Wt 192.0 lb

## 2024-01-28 DIAGNOSIS — M779 Enthesopathy, unspecified: Secondary | ICD-10-CM | POA: Diagnosis not present

## 2024-01-28 DIAGNOSIS — M778 Other enthesopathies, not elsewhere classified: Secondary | ICD-10-CM | POA: Diagnosis not present

## 2024-01-28 DIAGNOSIS — M25532 Pain in left wrist: Secondary | ICD-10-CM | POA: Diagnosis not present

## 2024-01-28 NOTE — Assessment & Plan Note (Signed)
 Related to activities of daily living Unremarkable physical exam Pain management discussed.  Advised to take Aleve twice a day for the next 7 days May benefit from wrist support Recommend x-ray today.  Will review images when ready Sports medicine referral placed today.

## 2024-01-28 NOTE — Progress Notes (Signed)
 Samantha Terrell 63 y.o.   Chief Complaint  Patient presents with   Wrist Injury    HISTORY OF PRESENT ILLNESS: This is a 63 y.o. female complaining of left wrist pain that started couple weeks ago Mild injury after fall.  Wrist Injury      Prior to Admission medications   Medication Sig Start Date End Date Taking? Authorizing Provider  ASHWAGANDHA PO Take by mouth.    [provider]  ELIQUIS  5 MG TABS tablet TAKE 1 TABLET BY MOUTH TWICE A DAY 01/15/24   Webb, Padonda B, FNP  losartan  (COZAAR ) 50 MG tablet Take 1 tablet (50 mg total) by mouth daily. 10/14/23   Alvia Corean CROME, FNP  magnesium (MAGTAB) 84 MG ( ) TBCR SR tablet Take 84 mg by mouth daily. Pt states she takes it 3 times a day. Pt is not sure about the dosage.    [provider]  metoprolol  succinate (TOPROL -XL) 25 MG 24 hr tablet Take 1 tablet (25 mg total) by mouth 2 (two) times daily. 03/03/23   Fenton, Clint R, PA  norethindrone-ethinyl estradiol  (FYAVOLV) 1-5 MG-MCG TABS tablet Take 1 tablet by mouth daily.    [provider]  OVER THE COUNTER MEDICATION Take 5,000 mcg by mouth 2 (two) times a day. DHT blocker    [provider]  sertraline (ZOLOFT) 50 MG tablet Take 50 mg by mouth daily with lunch. 11/19/14   [provider]  Vitamin D , Cholecalciferol, 1000 UNITS CAPS Take 1,000 Units by mouth at bedtime.    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/2021   B12 deficiency 12/20/2019   History of  adenomatous polyp of colon 12/19/2019   Pelvic prolapse with cystocele 12/07/2018   Intermittent palpitations 12/07/2018   Obesity (BMI 30.0-34.9) 12/02/2017   Atrial septal aneurysm 10/17/2015   Medication management 10/17/2015   Vitamin D  deficiency 10/17/2015   Hyperlipidemia 04/24/2015   Hypertension    Depression    Anxiety     Past Medical History:  Diagnosis Date   Atrial fibrillation (HCC)    Degenerative disc disease, cervical    Depression    Dyspnea    Family history of adverse reaction to anesthesia    mother-- ponv   GAD (generalized anxiety disorder)    History of adenomatous polyp of colon    History of COVID-19 11/2020   per pt mild symptoms that resolved   Hypertension    PVC (premature ventricular contraction)    symptomatic intermittant palpitations  (event monitor in epic 12-03-2014 showed SR w/ PVCs)   Sleep apnea    SUI (stress urinary incontinence, female)     Past Surgical History:  Procedure Laterality Date   ATRIAL FIBRILLATION ABLATION N/A 09/05/2022   Procedure: ATRIAL FIBRILLATION ABLATION;  Surgeon: Nancey Eulas BRAVO, MD;  Location: [facility];  Service: Cardiovascular;  Laterality: N/A;   BLADDER SUSPENSION N/A 11/06/2021   Procedure: TRANSVAGINAL TAPE (TVT) PROCEDURE;  Surgeon: Sarrah Browning, MD;  Location: [facility];  Service: Gynecology;  Laterality: N/A;   COLONOSCOPY WITH PROPOFOL   06/07/2021   by Dr.Beavers   CYSTOCELE REPAIR N/A 11/06/2021   Procedure: ANTERIOR REPAIR (CYSTOCELE);  Surgeon: Sarrah Browning, MD;  Location: [facility];  Service: Gynecology;  Laterality: N/A;   CYSTOSCOPY N/A 11/06/2021   Procedure: CYSTOSCOPY;  Surgeon: Sarrah Browning, MD;  Location: [facility];  Service: Gynecology;  Laterality: N/A;   DRUG INDUCED ENDOSCOPY Bilateral 01/06/2023   Procedure: DRUG INDUCED SLEEP ENDOSCOPY;  Surgeon: Carlie Clark, MD;  Location: [facility] OR;  Service: ENT;   Laterality: Bilateral;   NASAL SEPTUM SURGERY  1986    Social History   Socioeconomic History   Marital status: Married    Spouse name: Not on file   Number of children: Not on file   Years of education: Not on file   Highest education level: Not on file  Occupational History   Not on file  Tobacco Use   Smoking status: Never   Smokeless tobacco: Never   Tobacco comments:    Never smoke 10/03/22  Vaping Use   Vaping status: Never Used  Substance and Sexual Activity   Alcohol use: Yes    Alcohol/week: 4.0 - 5.0 standard drinks of alcohol    Types: 4 - 5 Glasses of wine per week    Comment: 4-5 glasses of wine weekly 10/03/22   Drug use: Never   Sexual activity: Yes    Partners: Male    Birth control/protection: Post-menopausal  Other Topics Concern   Not on file  Social History Narrative   Not on file   Social Drivers of Health   Financial Resource Strain: Not on file  Food Insecurity: Not on file  Transportation Needs: Not on file  Physical Activity: Sufficiently Active (12/03/2017)   Exercise Vital Sign    Days of Exercise per Week: 4 days    Minutes of Exercise per Session: 50 min  Stress: No Stress Concern Present (12/03/2017)   [employer] of Occupational Health - Occupational Stress Questionnaire    Feeling of Stress : Only a little  Social Connections: Not on file  Intimate Partner Violence: Not on file    Family History  Problem Relation Age of Onset   Arthritis Mother        bilateral knees   Hyperlipidemia Father    Cancer Maternal Grandmother        cervical cancer   Osteoporosis Maternal Grandmother    Heart attack Maternal Grandmother        age 80   Congestive Heart Failure Maternal Grandmother        age 28   Arthritis Maternal Grandfather        Severe, bilateral hands   Vascular Disease Maternal Grandfather        smoker, PAD   Osteoporosis Paternal Grandmother    Colon cancer Neg Hx    Breast cancer Neg Hx    Colon polyps Neg Hx     Esophageal cancer Neg Hx    Rectal cancer Neg Hx    Stomach cancer Neg Hx      Review of Systems  Constitutional: Negative.  Negative for chills and fever.  HENT: Negative.  Negative for congestion and sore throat.   Respiratory: Negative.  Negative for cough and shortness of breath.   Gastrointestinal:  Negative for nausea and vomiting.  Musculoskeletal:  Positive for joint pain (Left wrist).  Skin: Negative.  Negative for rash.  Neurological:  Negative  for dizziness and headaches.  All other systems reviewed and are negative.   Today's Vitals   01/28/24 1440  BP: 130/88  Pulse: 85  Temp: 98.1 F (36.7 C)  TempSrc: Oral  SpO2: 96%  Weight: 192 lb (87.1 kg)  Height: 5' 7.5 (1.715 m)   Body mass index is 29.63 kg/m.   Physical Exam Vitals reviewed.  Constitutional:      Appearance: Normal appearance.  HENT:     Head: Normocephalic.  Eyes:     Extraocular Movements: Extraocular movements intact.  Cardiovascular:     Rate and Rhythm: Normal rate.  Pulmonary:     Effort: Pulmonary effort is normal.  Musculoskeletal:     Comments: Left wrist: No ecchymosis or erythema.  Full range of motion.  Mild tenderness to ulnar area Left hand: Neurovascularly intact.  Full range of motion.  No abnormalities.  Skin:    General: Skin is warm and dry.  Neurological:     Mental Status: She is alert and oriented to person, place, and time.  Psychiatric:        Mood and Affect: Mood normal.        Behavior: Behavior normal.   DG Wrist Complete Left Result Date: 01/28/2024 CLINICAL DATA:  Left wrist tendinitis. Left wrist pain for 2 months after fall onto outstretched hand. EXAM: LEFT WRIST - COMPLETE 3+ VIEW COMPARISON:  None Available. FINDINGS: No acute or healing/healed fracture. The alignment is normal. Joint spaces are preserved. Minimal soft tissue calcification adjacent to the ulna styloid. No bony destructive change. Question mild soft tissue edema about the ulnar  aspect. IMPRESSION: 1. No acute or healing/healed fracture. 2. Minimal soft tissue calcification adjacent to the ulna styloid, nonspecific. Electronically Signed   By: Andrea Gasman M.D.   On: 01/28/2024 15:49      ASSESSMENT & PLAN: A total of 32 minutes was spent with the patient and counseling/coordination of care regarding preparing for this visit, review of most recent office visit notes, review of chronic medical conditions under management, review of all medications, differential diagnosis of wrist pain, pain management, review of x-ray images done today, diagnosis of tendinitis and need for follow-up with sports medicine, prognosis, documentation, and need for follow-up.  Problem List Items Addressed This Visit       Musculoskeletal and Integument   Left wrist tendinitis - Primary   Related to activities of daily living Unremarkable physical exam Pain management discussed.  Advised to take Aleve twice a day for the next 7 days May benefit from wrist support Recommend x-ray today.  Will review images when ready Sports medicine referral placed today.      Relevant Orders   DG Wrist Complete Left   Ambulatory referral to Sports Medicine   Patient Instructions  Tendinitis  Tendinitis is irritation and swelling (inflammation) of a tendon. A tendon is a cord of tissue that connects muscle to bone. Tendinitis is most common in the shoulder, ankle, elbow, or wrist. What are the causes? Using a tendon or muscle too much (overuse). This is the most common cause. Wear and tear that happens as you age. Injury. Some medical conditions, such as arthritis. Some medicines. What increases the risk? You are more likely to get this condition if you do activities that involve the same movements over and over again (repetitive motions). What are the signs or symptoms? Pain. Tenderness. Mild swelling. Decreased range of motion. How is this treated? This condition is usually treated  with RICE  therapy. RICE stands for: Rest. Ice. Compression. This means putting pressure on the affected area. Elevation. This means raising the affected area above the level of your heart. Treatment may also include: Medicines for swelling or pain. Exercises or physical therapy to help your tendon move better and get stronger. A brace or splint. A shot (injection) of a type of medicine called corticosteroid. Surgery. This is rarely needed. Follow these instructions at home: If you have a splint or brace that can be taken off: Wear the splint or brace as told by your doctor. Take it off only as told by your doctor. Check the skin around the splint or brace every day. Tell your doctor if you see problems. Loosen the splint or brace if your fingers or toes: Tingle. Become numb. Turn cold and blue. Keep the splint or brace clean. If the splint or brace is not waterproof: Do not let it get wet. Cover it with a watertight covering when you take a bath or shower, or take it off as told by your doctor. Managing pain, stiffness, and swelling     If told, put ice on the affected area. To do this: If you have a removable splint or brace, take it off as told by your doctor. Put ice in a plastic bag. Place a towel between your skin and the bag. Leave the ice on for 20 minutes, 2-3 times a day. Take off the ice if your skin turns bright red. This is very important. If you cannot feel pain, heat, or cold, you have a greater risk of damage to the area. Move the fingers or toes of the affected arm or leg often, if this applies. If told, raise the affected area above the level of your heart while you are sitting or lying down. If told, put heat on the affected area before you exercise. Use the heat source that your doctor recommends, such as a moist heat pack or a heating pad. Place a towel between your skin and the heat source. Leave the heat on for 20-30 minutes. Take off the heat if your skin  turns bright red. This is very important. If you cannot feel pain, heat, or cold, you have a greater risk of getting burned. Activity Rest the affected area as told by your doctor. Ask your doctor when it is safe to drive if you have a splint or brace on any part of your arm or leg. Return to your normal activities when your doctor says that it is safe. Avoid using the affected area while you have symptoms. Do exercises as told by your doctor. General instructions Wear an elastic bandage or pressure (compression) wrap only as told by your doctor. Take over-the-counter and prescription medicines only as told by your doctor. Keep all follow-up visits. Contact a doctor if: You do not get better. You get new problems, such as numbness in your hands or feet, and you do not know why. Summary Tendinitis is irritation and swelling (inflammation) of a tendon. You are more likely to get this condition if you do activities that involve the same movements over and over again. This condition is usually treated with RICE therapy. RICE stands for rest, ice, compression, and elevate. Avoid using the affected area while you have symptoms. This information is not intended to replace advice given to you by your health care provider. Make sure you discuss any questions you have with your health care provider. Document Revised: 03/21/2021 Document Reviewed: 03/21/2021 Elsevier Patient  Education  2024 Elsevier Inc.    Emil Schaumann, MD [facility]

## 2024-01-28 NOTE — Patient Instructions (Signed)
Tendinitis  Tendinitis is irritation and swelling (inflammation) of a tendon. A tendon is a cord of tissue that connects muscle to bone. Tendinitis is most common in the shoulder, ankle, elbow, or wrist. What are the causes? Using a tendon or muscle too much (overuse). This is the most common cause. Wear and tear that happens as you age. Injury. Some medical conditions, such as arthritis. Some medicines. What increases the risk? You are more likely to get this condition if you do activities that involve the same movements over and over again (repetitive motions). What are the signs or symptoms? Pain. Tenderness. Mild swelling. Decreased range of motion. How is this treated? This condition is usually treated with Carie therapy. Hanisch stands for: Rest. Ice. Compression. This means putting pressure on the affected area. Elevation. This means raising the affected area above the level of your heart. Treatment may also include: Medicines for swelling or pain. Exercises or physical therapy to help your tendon move better and get stronger. A brace or splint. A shot (injection) of a type of medicine called corticosteroid. Surgery. This is rarely needed. Follow these instructions at home: If you have a splint or brace that can be taken off: Wear the splint or brace as told by your doctor. Take it off only as told by your doctor. Check the skin around the splint or brace every day. Tell your doctor if you see problems. Loosen the splint or brace if your fingers or toes: Tingle. Become numb. Turn cold and blue. Keep the splint or brace clean. If the splint or brace is not waterproof: Do not let it get wet. Cover it with a watertight covering when you take a bath or shower, or take it off as told by your doctor. Managing pain, stiffness, and swelling     If told, put ice on the affected area. To do this: If you have a removable splint or brace, take it off as told by your doctor. Put  ice in a plastic bag. Place a towel between your skin and the bag. Leave the ice on for 20 minutes, 2-3 times a day. Take off the ice if your skin turns bright red. This is very important. If you cannot feel pain, heat, or cold, you have a greater risk of damage to the area. Move the fingers or toes of the affected arm or leg often, if this applies. If told, raise the affected area above the level of your heart while you are sitting or lying down. If told, put heat on the affected area before you exercise. Use the heat source that your doctor recommends, such as a moist heat pack or a heating pad. Place a towel between your skin and the heat source. Leave the heat on for 20-30 minutes. Take off the heat if your skin turns bright red. This is very important. If you cannot feel pain, heat, or cold, you have a greater risk of getting burned. Activity Rest the affected area as told by your doctor. Ask your doctor when it is safe to drive if you have a splint or brace on any part of your arm or leg. Return to your normal activities when your doctor says that it is safe. Avoid using the affected area while you have symptoms. Do exercises as told by your doctor. General instructions Wear an elastic bandage or pressure (compression) wrap only as told by your doctor. Take over-the-counter and prescription medicines only as told by your doctor. Keep all follow-up   visits. Contact a doctor if: You do not get better. You get new problems, such as numbness in your hands or feet, and you do not know why. Summary Tendinitis is irritation and swelling (inflammation) of a tendon. You are more likely to get this condition if you do activities that involve the same movements over and over again. This condition is usually treated with Rizzi therapy. Bracy stands for rest, ice, compression, and elevate. Avoid using the affected area while you have symptoms. This information is not intended to replace advice  given to you by your health care provider. Make sure you discuss any questions you have with your health care provider. Document Revised: 03/21/2021 Document Reviewed: 03/21/2021 Elsevier Patient Education  2024 [employer].

## 2024-02-10 NOTE — Progress Notes (Unsigned)
    Ben Jackson D.[facility] [address] [place] [address] Phone: [phone]   Assessment and Plan:     There are no diagnoses linked to this encounter.  ***   Pertinent previous records reviewed include ***    Follow Up: ***     Subjective:   I, Sophira Rumler, am serving as a Neurosurgeon for Doctor Morene Mace  Chief Complaint: left wrist pain   HPI:   02/11/2024 Patient is a 63 year old female with left wrist pain. Patient states    Relevant Historical Information: ***  Additional pertinent review of systems negative.   Current Outpatient Medications:    ASHWAGANDHA PO, Take by mouth., Disp: , Rfl:    ELIQUIS  5 MG TABS tablet, TAKE 1 TABLET BY MOUTH TWICE A DAY, Disp: 60 tablet, Rfl: 1   losartan  (COZAAR ) 50 MG tablet, Take 1 tablet (50 mg total) by mouth daily., Disp: 30 tablet, Rfl: 6   magnesium (MAGTAB) 84 MG ( ) TBCR SR tablet, Take 84 mg by mouth daily. Pt states she takes it 3 times a day. Pt is not sure about the dosage., Disp: , Rfl:    metoprolol  succinate (TOPROL -XL) 25 MG 24 hr tablet, Take 1 tablet (25 mg total) by mouth 2 (two) times daily., Disp: 60 tablet, Rfl: 6   norethindrone-ethinyl estradiol  (FYAVOLV) 1-5 MG-MCG TABS tablet, Take 1 tablet by mouth daily., Disp: , Rfl:    OVER THE COUNTER MEDICATION, Take 5,000 mcg by mouth 2 (two) times a day. DHT blocker, Disp: , Rfl:    sertraline (ZOLOFT) 50 MG tablet, Take 50 mg by mouth daily with lunch., Disp: , Rfl:    Vitamin D , Cholecalciferol, 1000 UNITS CAPS, Take 1,000 Units by mouth at bedtime., Disp: , Rfl:    Objective:     There were no vitals filed for this visit.    There is no height or weight on file to calculate BMI.    Physical Exam:    ***   Electronically signed by:  Odis Mace D.[facility] 7:43 AM 02/10/24

## 2024-02-11 ENCOUNTER — Ambulatory Visit: Admitting: Sports Medicine

## 2024-02-11 VITALS — BP 138/82 | HR 89 | Ht 67.0 in | Wt 192.0 lb

## 2024-02-11 DIAGNOSIS — S6982XA Other specified injuries of left wrist, hand and finger(s), initial encounter: Secondary | ICD-10-CM | POA: Diagnosis not present

## 2024-02-11 DIAGNOSIS — M25532 Pain in left wrist: Secondary | ICD-10-CM

## 2024-02-11 MED ORDER — MELOXICAM 15 MG PO TABS: 15.0000 mg | ORAL_TABLET | Freq: Every day | ORAL | 0 refills | Status: DC

## 2024-02-11 MED ORDER — METHYLPREDNISOLONE 4 MG PO TBPK: ORAL_TABLET | ORAL | 0 refills | Status: DC

## 2024-02-11 NOTE — Patient Instructions (Addendum)
 Wrist HEP  - Start meloxicam  15 mg daily x2 weeks.  If still having pain after 2 weeks, complete 3rd-week of NSAID. May use remaining NSAID as needed once daily for pain control.  Do not to use additional over-the-counter NSAIDs (ibuprofen, naproxen, Advil, Aleve, etc.) while taking prescription NSAIDs.  May use Tylenol  [phone] mg 2 to 3 times a day for breakthrough pain. Recommend a wrist brace without thumb from [place], dicks sporting 4-6 week follow up

## 2024-02-16 ENCOUNTER — Encounter: Payer: Self-pay | Admitting: Family Medicine

## 2024-02-17 NOTE — Telephone Encounter (Signed)
 Patient has signed  paperwork and picked it up.

## 2024-02-26 ENCOUNTER — Ambulatory Visit: Payer: Self-pay | Admitting: Surgery

## 2024-02-29 ENCOUNTER — Other Ambulatory Visit (HOSPITAL_COMMUNITY): Payer: Self-pay | Admitting: Physician Assistant

## 2024-02-29 DIAGNOSIS — I493 Ventricular premature depolarization: Secondary | ICD-10-CM

## 2024-03-09 NOTE — Progress Notes (Signed)
 Ben Kelise Kuch D.[facility] [address] [place] [address] Phone: [phone]   Assessment and Plan:     1. Left wrist pain (Primary) 2. TFCC (triangular fibrocartilage complex) injury, left, initial encounter -Chronic with exacerbation, subsequent visit - Overall improvement in flare of left wrist pain after fall.  Still most consistent with irritation versus partial tearing of TFCC - Reassuring that patient has had overall improvement in symptoms with conservative therapy, no fracture or dislocation seen on prior x-ray imaging - Do not recommend p.o. NSAIDs due to chronic anticoagulation on Eliquis  - Patient had moderate relief with prednisone  Dosepak.  Could consider repeat Dosepak in the future - Recommend continuing to use wrist brace to decrease pressure and irritation of TFCC and ulnar-sided wrist - Restart HEP - May use Tylenol  and topical Voltaren gel as needed for day-to-day pain relief    Pertinent previous records reviewed include none  Follow Up: As needed if no improvement or worsening of symptoms.  Could consider OT/PT versus ultrasound versus CSI versus MRI   Subjective:   I, Samantha Terrell, am serving as a Neurosurgeon for Doctor Morene Mace   Chief Complaint: left wrist pain    HPI:    02/11/2024 Patient is a 63 year old female with left wrist pain. Patient states had a fall in the middle of the night a couple of weeks ago. FOOSHED. Decreased ROM. Pain sometimes radiates up the arm. No meds. Isnt able to put her weight on it and push off. Intermittent numbness and tingling. decrease in grip strength.    03/10/2024 Patient states prednisone  helped but has some intermittent soreness   Relevant Historical Information: History of atrial fibrillation on chronic anticoagulation with Eliquis   Additional pertinent review of systems negative.   Current Outpatient Medications:    ASHWAGANDHA PO, Take by mouth., Disp: , Rfl:     ELIQUIS  5 MG TABS tablet, TAKE 1 TABLET BY MOUTH TWICE A DAY, Disp: 60 tablet, Rfl: 1   losartan  (COZAAR ) 50 MG tablet, Take 1 tablet (50 mg total) by mouth daily., Disp: 30 tablet, Rfl: 6   magnesium (MAGTAB) 84 MG ( ) TBCR SR tablet, Take 84 mg by mouth daily. Pt states she takes it 3 times a day. Pt is not sure about the dosage., Disp: , Rfl:    methylPREDNISolone  (MEDROL  DOSEPAK) 4 MG TBPK tablet, Take 6 tablets on day 1.  Take 5 tablets on day 2.  Take 4 tablets on day 3.  Take 3 tablets on day 4.  Take 2 tablets on day 5.  Take 1 tablet on day 6., Disp: 21 tablet, Rfl: 0   metoprolol  succinate (TOPROL -XL) 25 MG 24 hr tablet, TAKE 1 TABLET BY MOUTH TWICE A DAY, Disp: 180 tablet, Rfl: 3   norethindrone-ethinyl estradiol  (FYAVOLV) 1-5 MG-MCG TABS tablet, Take 1 tablet by mouth daily., Disp: , Rfl:    OVER THE COUNTER MEDICATION, Take 5,000 mcg by mouth 2 (two) times a day. DHT blocker, Disp: , Rfl:    sertraline (ZOLOFT) 50 MG tablet, Take 50 mg by mouth daily with lunch., Disp: , Rfl:    Vitamin D , Cholecalciferol, 1000 UNITS CAPS, Take 1,000 Units by mouth at bedtime., Disp: , Rfl:  No current facility-administered medications for this visit.  Facility-Administered Medications Ordered in Other Visits:    6 CHG cloth bath night before surgery, , , Once **AND** [START ON 03/11/2024] 6 CHG cloth bath AM of surgery, , , Once **AND**  Chlorhexidine  Gluconate Cloth 2 % PADS 6 each, 6 each, Topical, Once **AND** Chlorhexidine  Gluconate Cloth 2 % PADS 6 each, 6 each, Topical, Once, Stechschulte, Deward PARAS, MD   Objective:     Vitals:   03/10/24 1528  Pulse: 97  SpO2: 97%  Weight: 193 lb (87.5 kg)  Height: 5' 7 (1.702 m)      Body mass index is 30.23 kg/m.    Physical Exam:    General: Appears well, nad, nontoxic and pleasant Neuro:sensation intact, strength is 5/5 in upper extremities, muscle tone wnl Skin:no susupicious lesions or rashes   Left hand/Wrist:   No deformity or  swelling appreciated. ROM  Ext 90, flexion 70, radial/ulnar deviation 30 TTP MILDLY TFCC nttp over the snuff box, dorsal carpals, volar carpals, radial styloid, ulnar styloid, 1st mcp,  Negative Tinel's, Phalen's, Prayer Tests Negative finklestein Positive tfcc bounce test, but less painful than prior visit No pain with resisted ext, flex or deviation    Electronically signed by:  Odis Mace [facility] 3:46 PM 03/10/24

## 2024-03-10 ENCOUNTER — Encounter (HOSPITAL_BASED_OUTPATIENT_CLINIC_OR_DEPARTMENT_OTHER): Payer: Self-pay | Admitting: Surgery

## 2024-03-10 ENCOUNTER — Ambulatory Visit (INDEPENDENT_AMBULATORY_CARE_PROVIDER_SITE_OTHER): Admitting: Sports Medicine

## 2024-03-10 ENCOUNTER — Other Ambulatory Visit: Payer: Self-pay

## 2024-03-10 ENCOUNTER — Other Ambulatory Visit: Payer: Self-pay | Admitting: Family

## 2024-03-10 VITALS — HR 97 | Ht 67.0 in | Wt 193.0 lb

## 2024-03-10 DIAGNOSIS — S6982XA Other specified injuries of left wrist, hand and finger(s), initial encounter: Secondary | ICD-10-CM | POA: Diagnosis not present

## 2024-03-10 DIAGNOSIS — M25532 Pain in left wrist: Secondary | ICD-10-CM | POA: Diagnosis not present

## 2024-03-10 DIAGNOSIS — I4891 Unspecified atrial fibrillation: Secondary | ICD-10-CM

## 2024-03-10 MED ORDER — CHLORHEXIDINE GLUCONATE CLOTH 2 % EX PADS: 6.0000 | MEDICATED_PAD | Freq: Once | CUTANEOUS | Status: DC

## 2024-03-10 NOTE — Progress Notes (Signed)

## 2024-03-10 NOTE — Progress Notes (Signed)
   03/10/24 1114  Pre-op Phone Call  Surgery Date Verified 03/16/24  Arrival Time Verified 1130  Surgery Location Verified [facility] [place]  Medical History Reviewed Yes  Is the patient taking a GLP-1 receptor agonist? No  Does the patient have diabetes? No diagnosis of diabetes  Do you have a history of heart problems? Yes (HTN; hx afib; eliquis  (instructions to hold x2d prior to sx))  Does patient have other implanted devices? No  Patient Teaching Enhanced Recovery;Pre / Post Procedure  Patient educated about smoking cessation 24 hours prior to surgery. N/A Non-Smoker  Med Rec Completed Yes  Take the Following Meds the Morning of Surgery metoprol with sip water; hold eliquis  x2d before sx; hold herb/supp/vit x5d  Recent  Lab Work, EKG, CXR? No  NPO (Including gum & candy) After midnight  Patient instructed to stop clear liquids including Carb loading drink at: 0900  Stop Solids, Milk, Candy, and Gum STARTING AT MIDNIGHT  Responsible adult to drive and be with you for 24 hours? Yes  Name & Phone Number for Ride/Caregiver husband michael  No Jewelry, money, nail polish or make-up.  No lotions, powders, perfumes. No shaving  48 hrs. prior to surgery. Yes  Contacts, Dentures & Glasses Will Have to be Removed Before OR. Yes  Please bring your ID and Insurance Card the morning of your surgery. (Surgery Centers Only) Yes  Bring any papers or x-rays with you that your surgeon gave you. Yes  Instructed to contact the location of procedure/ provider if they or anyone in their household develops symptoms or tests positive for COVID-19, has close contact with someone who tests positive for COVID, or has known exposure to any contagious illness. Yes  Call this number the morning of surgery  with any problems that may cancel your surgery. [phone]  Covid-19 Assessment  Have you had a positive COVID-19 test within the previous 90 days? No  COVID Testing Guidance Proceed with the additional questions.   Patient's surgery required a COVID-19 test (cardiothoracic, complex ENT, and bronchoscopies/ EBUS) No  Have you been unmasked and in close contact with anyone with COVID-19 or COVID-19 symptoms within the past 10 days? No  Do you or anyone in your household currently have any COVID-19 symptoms? No

## 2024-03-10 NOTE — Patient Instructions (Signed)
 Voltaren gel over areas of pain   Us  wrist brace as needed for pain flares  Restart HEP   As needed follow up

## 2024-03-14 ENCOUNTER — Encounter: Payer: Self-pay | Admitting: Family Medicine

## 2024-03-14 ENCOUNTER — Other Ambulatory Visit: Payer: Self-pay

## 2024-03-14 DIAGNOSIS — I4891 Unspecified atrial fibrillation: Secondary | ICD-10-CM

## 2024-03-14 MED ORDER — APIXABAN 5 MG PO TABS: 5.0000 mg | ORAL_TABLET | Freq: Two times a day (BID) | ORAL | 1 refills | Status: DC

## 2024-03-16 ENCOUNTER — Other Ambulatory Visit: Payer: Self-pay

## 2024-03-16 ENCOUNTER — Encounter (HOSPITAL_BASED_OUTPATIENT_CLINIC_OR_DEPARTMENT_OTHER)
Admission: RE | Disposition: A | Discharge status: Home / Self Care | Payer: Self-pay | Source: Home / Self Care | Attending: Surgery

## 2024-03-16 ENCOUNTER — Ambulatory Visit (HOSPITAL_BASED_OUTPATIENT_CLINIC_OR_DEPARTMENT_OTHER)
Admission: RE | Admit: 2024-03-16 | Discharge: 2024-03-16 | Disposition: A | Discharge status: Home / Self Care | Attending: Surgery | Admitting: Surgery

## 2024-03-16 ENCOUNTER — Ambulatory Visit (HOSPITAL_BASED_OUTPATIENT_CLINIC_OR_DEPARTMENT_OTHER): Admitting: Anesthesiology

## 2024-03-16 ENCOUNTER — Encounter (HOSPITAL_BASED_OUTPATIENT_CLINIC_OR_DEPARTMENT_OTHER): Payer: Self-pay | Admitting: Surgery

## 2024-03-16 DIAGNOSIS — Z539 Procedure and treatment not carried out, unspecified reason: Secondary | ICD-10-CM | POA: Diagnosis not present

## 2024-03-16 DIAGNOSIS — I10 Essential (primary) hypertension: Secondary | ICD-10-CM | POA: Insufficient documentation

## 2024-03-16 DIAGNOSIS — K8010 Calculus of gallbladder with chronic cholecystitis without obstruction: Secondary | ICD-10-CM | POA: Diagnosis not present

## 2024-03-16 DIAGNOSIS — F411 Generalized anxiety disorder: Secondary | ICD-10-CM | POA: Diagnosis not present

## 2024-03-16 DIAGNOSIS — Z7901 Long term (current) use of anticoagulants: Secondary | ICD-10-CM | POA: Insufficient documentation

## 2024-03-16 DIAGNOSIS — K8012 Calculus of gallbladder with acute and chronic cholecystitis without obstruction: Secondary | ICD-10-CM | POA: Insufficient documentation

## 2024-03-16 DIAGNOSIS — F32A Depression, unspecified: Secondary | ICD-10-CM | POA: Diagnosis not present

## 2024-03-16 DIAGNOSIS — G4730 Sleep apnea, unspecified: Secondary | ICD-10-CM | POA: Diagnosis not present

## 2024-03-16 DIAGNOSIS — I4891 Unspecified atrial fibrillation: Secondary | ICD-10-CM | POA: Insufficient documentation

## 2024-03-16 DIAGNOSIS — Z79899 Other long term (current) drug therapy: Secondary | ICD-10-CM | POA: Insufficient documentation

## 2024-03-16 HISTORY — DX: Cardiac arrhythmia, unspecified: I49.9

## 2024-03-16 SURGERY — LAPAROSCOPIC CHOLECYSTECTOMY
Anesthesia: General

## 2024-03-16 MED ORDER — ONDANSETRON HCL 4 MG/2ML IJ SOLN
INTRAMUSCULAR | Status: AC
  Filled 2024-03-16: qty 2

## 2024-03-16 MED ORDER — GABAPENTIN 300 MG PO CAPS
ORAL_CAPSULE | ORAL | Status: AC
  Filled 2024-03-16: qty 1

## 2024-03-16 MED ORDER — GABAPENTIN 300 MG PO CAPS
300.0000 mg | ORAL_CAPSULE | ORAL | Status: AC
  Administered 2024-03-16: 300 mg via ORAL

## 2024-03-16 MED ORDER — ACETAMINOPHEN 500 MG PO TABS
ORAL_TABLET | ORAL | Status: AC
  Filled 2024-03-16: qty 2

## 2024-03-16 MED ORDER — CEFAZOLIN SODIUM-DEXTROSE 2-4 GM/100ML-% IV SOLN
INTRAVENOUS | Status: AC
  Filled 2024-03-16: qty 100

## 2024-03-16 MED ORDER — LACTATED RINGERS IV SOLN: INTRAVENOUS | Status: DC

## 2024-03-16 MED ORDER — KETOROLAC TROMETHAMINE 15 MG/ML IJ SOLN: 15.0000 mg | INTRAMUSCULAR | Status: DC

## 2024-03-16 MED ORDER — CEFAZOLIN SODIUM-DEXTROSE 2-4 GM/100ML-% IV SOLN: 2.0000 g | INTRAVENOUS | Status: DC

## 2024-03-16 MED ORDER — DEXAMETHASONE SODIUM PHOSPHATE 10 MG/ML IJ SOLN
INTRAMUSCULAR | Status: AC
  Filled 2024-03-16: qty 1

## 2024-03-16 MED ORDER — MIDAZOLAM HCL 2 MG/2ML IJ SOLN
INTRAMUSCULAR | Status: AC
  Filled 2024-03-16: qty 2

## 2024-03-16 MED ORDER — LIDOCAINE 2% (20 MG/ML) 5 ML SYRINGE
INTRAMUSCULAR | Status: AC
Start: 2024-03-16 — End: 2024-03-16
  Filled 2024-03-16: qty 5

## 2024-03-16 MED ORDER — ACETAMINOPHEN 500 MG PO TABS
1000.0000 mg | ORAL_TABLET | ORAL | Status: AC
  Administered 2024-03-16: 1000 mg via ORAL

## 2024-03-16 MED ORDER — PROPOFOL 10 MG/ML IV BOLUS
INTRAVENOUS | Status: AC
  Filled 2024-03-16: qty 20

## 2024-03-16 MED ORDER — FENTANYL CITRATE (PF) 100 MCG/2ML IJ SOLN
INTRAMUSCULAR | Status: AC
  Filled 2024-03-16: qty 2

## 2024-03-16 MED ORDER — KETOROLAC TROMETHAMINE 15 MG/ML IJ SOLN
INTRAMUSCULAR | Status: AC
  Filled 2024-03-16: qty 1

## 2024-03-16 NOTE — Progress Notes (Signed)
 Patient forgot to stop eliquis  prior to surgery, will need to r/s surgery and hold med per cardiology

## 2024-03-16 NOTE — Anesthesia Preprocedure Evaluation (Signed)
 Anesthesia Evaluation  Patient identified by MRN, date of birth, ID band Patient awake    Reviewed: Allergy & Precautions, NPO status , Patient's Chart, lab work & pertinent test results  Airway Mallampati: III  TM Distance: >3 FB Neck ROM: Full    Dental  (+) Dental Advisory Given   Pulmonary sleep apnea    breath sounds clear to auscultation       Cardiovascular hypertension, Pt. on medications and Pt. on home beta blockers + dysrhythmias Atrial Fibrillation  Rhythm:Regular Rate:Normal     Neuro/Psych   Anxiety Depression    negative neurological ROS     GI/Hepatic negative GI ROS, Neg liver ROS,,,  Endo/Other  negative endocrine ROS    Renal/GU negative Renal ROS     Musculoskeletal  (+) Arthritis , Osteoarthritis,    Abdominal   Peds  Hematology negative hematology ROS (+)   Anesthesia Other Findings   Reproductive/Obstetrics                              Anesthesia Physical Anesthesia Plan  ASA: 2  Anesthesia Plan: General   Post-op Pain Management: Tylenol  PO (pre-op)* and Gabapentin  PO (pre-op)*   Induction: Intravenous  PONV Risk Score and Plan: 3 and Ondansetron , Treatment may vary due to age or medical condition, Dexamethasone  and Midazolam   Airway Management Planned: Oral ETT  Additional Equipment: None  Intra-op Plan:   Post-operative Plan: Extubation in OR  Informed Consent: I have reviewed the patients History and Physical, chart, labs and discussed the procedure including the risks, benefits and alternatives for the proposed anesthesia with the patient or authorized representative who has indicated his/her understanding and acceptance.     Dental advisory given  Plan Discussed with: CRNA  Anesthesia Plan Comments: (PAT note by Lynwood Hope, PA-C: Follows with EP cardiology for history of PVCs and A-fib status post recent ablation February 2024.  Last seen  by Dr. Nancey 12/05/2022 and noted to be maintaining sinus rhythm.  She was continued on Eliquis  and metoprolol .  Patient will need day of surgery labs and evaluation.  EKG 12/05/2022: NSR.  Rate 67.  Coronary CTA 05/15/2022: IMPRESSION: 1. No evidence of CAD, CADRADS = 0.  2. Coronary calcium score of 0. This was 0 percentile for age and sex matched control.  3. Normal coronary origin with right dominance.  4. Normal left atrial appendage with a probable mixing defect in the apex, thrombus is less likely.  5. Consider non-coronary causes of chest pain.  TTE 05/05/2022: 1. Left ventricular ejection fraction, by estimation, is 60 to 65%. The  left ventricle has normal function. The left ventricle has no regional  wall motion abnormalities. There is mild concentric left ventricular  hypertrophy. Left ventricular diastolic  parameters are indeterminate.  2. Right ventricular systolic function is normal. The right ventricular  size is normal. There is normal pulmonary artery systolic pressure.  3. The mitral valve is normal in structure. No evidence of mitral valve  regurgitation. No evidence of mitral stenosis.  4. The aortic valve is normal in structure. Aortic valve regurgitation is  mild. No aortic stenosis is present.  5. The inferior vena cava is normal in size with greater than 50%  respiratory variability, suggesting right atrial pressure of 3 mmHg.   )         Anesthesia Quick Evaluation

## 2024-03-16 NOTE — H&P (Addendum)
 Admitting Physician: Deward PARAS Annis Lagoy  Service: General surgery  CC: abdominal pain  Subjective   HPI: Samantha Terrell is an 63 y.o. female who is here for cholecystectomy  Past Medical History:  Diagnosis Date   Atrial fibrillation (HCC)    Degenerative disc disease, cervical    Depression    Dyspnea    Dysrhythmia    Family history of adverse reaction to anesthesia    mother-- ponv   GAD (generalized anxiety disorder)    History of adenomatous polyp of colon    History of COVID-19 11/2020   per pt mild symptoms that resolved   Hypertension    PVC (premature ventricular contraction)    symptomatic intermittant palpitations  (event monitor in epic 12-03-2014 showed SR w/ PVCs)   Sleep apnea    SUI (stress urinary incontinence, female)     Past Surgical History:  Procedure Laterality Date   ATRIAL FIBRILLATION ABLATION N/A 09/05/2022   Procedure: ATRIAL FIBRILLATION ABLATION;  Surgeon: Nancey Eulas BRAVO, MD;  Location: [facility];  Service: Cardiovascular;  Laterality: N/A;   BLADDER SUSPENSION N/A 11/06/2021   Procedure: TRANSVAGINAL TAPE (TVT) PROCEDURE;  Surgeon: Sarrah Browning, MD;  Location: [facility];  Service: Gynecology;  Laterality: N/A;   COLONOSCOPY WITH PROPOFOL   06/07/2021   by Dr.Beavers   CYSTOCELE REPAIR N/A 11/06/2021   Procedure: ANTERIOR REPAIR (CYSTOCELE);  Surgeon: Sarrah Browning, MD;  Location: [facility];  Service: Gynecology;  Laterality: N/A;   CYSTOSCOPY N/A 11/06/2021   Procedure: CYSTOSCOPY;  Surgeon: Sarrah Browning, MD;  Location: [facility];  Service: Gynecology;  Laterality: N/A;   DRUG INDUCED ENDOSCOPY Bilateral 01/06/2023   Procedure: DRUG INDUCED SLEEP ENDOSCOPY;  Surgeon: Carlie Clark, MD;  Location: [facility];  Service: ENT;  Laterality: Bilateral;   NASAL SEPTUM SURGERY  1986    Family History  Problem Relation Age of Onset   Arthritis Mother        bilateral  knees   Hyperlipidemia Father    Cancer Maternal Grandmother        cervical cancer   Osteoporosis Maternal Grandmother    Heart attack Maternal Grandmother        age 54   Congestive Heart Failure Maternal Grandmother        age 72   Arthritis Maternal Grandfather        Severe, bilateral hands   Vascular Disease Maternal Grandfather        smoker, PAD   Osteoporosis Paternal Grandmother    Colon cancer Neg Hx    Breast cancer Neg Hx    Colon polyps Neg Hx    Esophageal cancer Neg Hx    Rectal cancer Neg Hx    Stomach cancer Neg Hx     Social:  reports that she has never smoked. She has never used smokeless tobacco. She reports current alcohol use of about 4.0 - 5.0 standard drinks of alcohol per week. She reports that she does not use drugs.  Allergies:  Allergies  Allergen Reactions   Latex Itching    Medications: Current Outpatient Medications  Medication Instructions   apixaban  (ELIQUIS ) 5 mg, Oral, 2 times daily   ASHWAGANDHA PO Take by mouth.   losartan  (COZAAR ) 50 mg, Oral, Daily   magnesium (MAGTAB) 84 mg, Daily   metoprolol  succinate (TOPROL -XL) 25 mg, Oral, 2 times daily   norethindrone-ethinyl estradiol  (FYAVOLV) 1-5 MG-MCG TABS tablet 1 tablet, Daily   OVER THE  COUNTER MEDICATION 5,000 mcg, 2 times daily   sertraline (ZOLOFT) 50 mg, Daily with lunch   Vitamin D  (Cholecalciferol) 1,000 Units, Daily at bedtime    ROS - all of the below systems have been reviewed with the patient and positives are indicated with bold text General: chills, fever or night sweats Eyes: blurry vision or double vision ENT: epistaxis or sore throat Allergy/Immunology: itchy/watery eyes or nasal congestion Hematologic/Lymphatic: bleeding problems, blood clots or swollen lymph nodes Endocrine: temperature intolerance or unexpected weight changes Breast: new or changing breast lumps or nipple discharge Resp: cough, shortness of breath, or wheezing CV: chest pain or dyspnea on  exertion GI: as per HPI GU: dysuria, trouble voiding, or hematuria MSK: joint pain or joint stiffness Neuro: TIA or stroke symptoms Derm: pruritus and skin lesion changes Psych: anxiety and depression  Objective   PE Blood pressure (!) 141/81, pulse 77, temperature (!) 97.1 F (36.2 C), temperature source Temporal, height 5' 7 (1.702 m), weight 86.5 kg, SpO2 98%. Constitutional: NAD; conversant; no deformities Eyes: Moist conjunctiva; no lid lag; anicteric; PERRL Neck: Trachea midline; no thyromegaly Lungs: Normal respiratory effort; no tactile fremitus CV: RRR; no palpable thrills; no pitting edema GI: Abd soft, nontender; no palpable hepatosplenomegaly MSK: Normal range of motion of extremities; no clubbing/cyanosis Psychiatric: Appropriate affect; alert and oriented x3 Lymphatic: No palpable cervical or axillary lymphadenopathy  No results found for this or any previous visit (from the past 24 hours).  Imaging Orders  No imaging studies ordered today    RUQ US  12/21/23  IMPRESSION: Cholelithiasis with mild gallbladder wall thickening. No pericholecystic fluid and negative sonographic Murphy sign. Findings are equivocal for acute cholecystitis.   Assessment and Plan   Samantha Terrell is an 63 y.o. female with Cholelithiasis with cholecystitis  I recommended laparoscopic cholecystectomy. We discussed the procedure itself, its risks, benefit and alternatives. After a full discussion and all questions answered, the patient granted consent to proceed.    Unfortunately,  she took her eliquis  yesterday morning, and needs to hold it for 48 hours prior to surgery.  We will reschedule to reduce risk of bleeding complications.    Deward JINNY Foy, MD  [facility] Use [email or web address] to contact on call provider

## 2024-03-24 ENCOUNTER — Telehealth: Payer: Self-pay | Admitting: Family Medicine

## 2024-03-24 NOTE — Telephone Encounter (Signed)
 Copied from CRM #[id number]. Topic: Clinical - Prescription Issue >> Mar 24, 2024 11:56 AM Terri G wrote: Reason for CRM: Patient stated they gave her a 2 month supply of apixaban  (ELIQUIS ) 5 MG TABS tablet but she needs a 3 month supply with the program she is with so she can get it at $0.

## 2024-03-25 ENCOUNTER — Other Ambulatory Visit: Payer: Self-pay

## 2024-03-25 DIAGNOSIS — I4891 Unspecified atrial fibrillation: Secondary | ICD-10-CM

## 2024-03-25 MED ORDER — APIXABAN 5 MG PO TABS: 5.0000 mg | ORAL_TABLET | Freq: Two times a day (BID) | ORAL | 1 refills | Status: AC

## 2024-03-25 NOTE — Telephone Encounter (Signed)
Medication resent for 90 days

## 2024-03-31 ENCOUNTER — Encounter: Payer: Commercial Managed Care - PPO | Admitting: Nurse Practitioner

## 2024-03-31 ENCOUNTER — Ambulatory Visit: Payer: Self-pay | Admitting: Surgery

## 2024-03-31 NOTE — Anesthesia Preprocedure Evaluation (Signed)
 Anesthesia Evaluation  Patient identified by MRN, date of birth, ID band Patient awake    Reviewed: Allergy & Precautions, NPO status , Patient's Chart, lab work & pertinent test results  History of Anesthesia Complications (+) Family history of anesthesia reaction  Airway Mallampati: II  TM Distance: >3 FB Neck ROM: Full    Dental no notable dental hx. (+) Dental Advisory Given   Pulmonary shortness of breath, sleep apnea    Pulmonary exam normal breath sounds clear to auscultation       Cardiovascular hypertension, Pt. on medications and Pt. on home beta blockers Normal cardiovascular exam+ dysrhythmias Atrial Fibrillation  Rhythm:Regular Rate:Normal  Echo 04/2022 1. Left ventricular ejection fraction, by estimation, is 60 to 65%. The  left ventricle has normal function. The left ventricle has no regional  wall motion abnormalities. There is mild concentric left ventricular  hypertrophy. Left ventricular diastolic  parameters are indeterminate.   2. Right ventricular systolic function is normal. The right ventricular  size is normal. There is normal pulmonary artery systolic pressure.   3. The mitral valve is normal in structure. No evidence of mitral valve  regurgitation. No evidence of mitral stenosis.   4. The aortic valve is normal in structure. Aortic valve regurgitation is  mild. No aortic stenosis is present.   5. The inferior vena cava is normal in size with greater than 50%  respiratory variability, suggesting right atrial pressure of 3 mmHg.      Neuro/Psych  PSYCHIATRIC DISORDERS Anxiety Depression    negative neurological ROS     GI/Hepatic negative GI ROS, Neg liver ROS,neg GERD  ,,  Endo/Other  negative endocrine ROS    Renal/GU negative Renal ROS     Musculoskeletal  (+) Arthritis , Osteoarthritis,    Abdominal  (+) + obese  Peds  Hematology negative hematology ROS (+)   Anesthesia Other  Findings   Reproductive/Obstetrics negative OB ROS                              Anesthesia Physical Anesthesia Plan  ASA: 2  Anesthesia Plan: General   Post-op Pain Management: Tylenol  PO (pre-op)* and Gabapentin  PO (pre-op)*   Induction: Intravenous  PONV Risk Score and Plan: 3 and 4 or greater and Ondansetron , Treatment may vary due to age or medical condition, Dexamethasone  and Midazolam   Airway Management Planned: Oral ETT  Additional Equipment: None  Intra-op Plan:   Post-operative Plan: Extubation in OR  Informed Consent: I have reviewed the patients History and Physical, chart, labs and discussed the procedure including the risks, benefits and alternatives for the proposed anesthesia with the patient or authorized representative who has indicated his/her understanding and acceptance.     Dental advisory given  Plan Discussed with: CRNA  Anesthesia Plan Comments: (PAT note by Lynwood Hope, PA-C: Follows with EP cardiology for history of PVCs and A-fib status post recent ablation February 2024.  Last seen by Dr. Nancey 12/05/2022 and noted to be maintaining sinus rhythm.  She was continued on Eliquis  and metoprolol .  Patient will need day of surgery labs and evaluation.  EKG 12/05/2022: NSR.  Rate 67.  Coronary CTA 05/15/2022: IMPRESSION: 1. No evidence of CAD, CADRADS = 0.   2. Coronary calcium score of 0. This was 0 percentile for age and sex matched control.   3. Normal coronary origin with right dominance.   4. Normal left atrial appendage with a probable mixing  defect in the apex, thrombus is less likely.   5. Consider non-coronary causes of chest pain.  TTE 05/05/2022:  1. Left ventricular ejection fraction, by estimation, is 60 to 65%. The  left ventricle has normal function. The left ventricle has no regional  wall motion abnormalities. There is mild concentric left ventricular  hypertrophy. Left ventricular diastolic   parameters are indeterminate.   2. Right ventricular systolic function is normal. The right ventricular  size is normal. There is normal pulmonary artery systolic pressure.   3. The mitral valve is normal in structure. No evidence of mitral valve  regurgitation. No evidence of mitral stenosis.   4. The aortic valve is normal in structure. Aortic valve regurgitation is  mild. No aortic stenosis is present.   5. The inferior vena cava is normal in size with greater than 50%  respiratory variability, suggesting right atrial pressure of 3 mmHg.   )         Anesthesia Quick Evaluation

## 2024-04-01 ENCOUNTER — Ambulatory Visit (HOSPITAL_BASED_OUTPATIENT_CLINIC_OR_DEPARTMENT_OTHER)
Admission: RE | Admit: 2024-04-01 | Discharge: 2024-04-01 | Disposition: A | Discharge status: Home / Self Care | Attending: Surgery | Admitting: Surgery

## 2024-04-01 ENCOUNTER — Encounter (HOSPITAL_BASED_OUTPATIENT_CLINIC_OR_DEPARTMENT_OTHER)
Admission: RE | Disposition: A | Discharge status: Home / Self Care | Payer: Self-pay | Source: Home / Self Care | Attending: Surgery

## 2024-04-01 ENCOUNTER — Encounter (HOSPITAL_BASED_OUTPATIENT_CLINIC_OR_DEPARTMENT_OTHER): Payer: Self-pay | Admitting: Anesthesiology

## 2024-04-01 ENCOUNTER — Encounter (HOSPITAL_BASED_OUTPATIENT_CLINIC_OR_DEPARTMENT_OTHER): Payer: Self-pay | Admitting: Surgery

## 2024-04-01 ENCOUNTER — Other Ambulatory Visit: Payer: Self-pay

## 2024-04-01 ENCOUNTER — Ambulatory Visit (HOSPITAL_BASED_OUTPATIENT_CLINIC_OR_DEPARTMENT_OTHER): Payer: Self-pay | Admitting: Anesthesiology

## 2024-04-01 DIAGNOSIS — Z01818 Encounter for other preprocedural examination: Secondary | ICD-10-CM

## 2024-04-01 DIAGNOSIS — I4891 Unspecified atrial fibrillation: Secondary | ICD-10-CM | POA: Insufficient documentation

## 2024-04-01 DIAGNOSIS — I10 Essential (primary) hypertension: Secondary | ICD-10-CM | POA: Diagnosis not present

## 2024-04-01 DIAGNOSIS — K8010 Calculus of gallbladder with chronic cholecystitis without obstruction: Secondary | ICD-10-CM | POA: Diagnosis not present

## 2024-04-01 DIAGNOSIS — G4730 Sleep apnea, unspecified: Secondary | ICD-10-CM | POA: Diagnosis not present

## 2024-04-01 DIAGNOSIS — K8012 Calculus of gallbladder with acute and chronic cholecystitis without obstruction: Secondary | ICD-10-CM | POA: Insufficient documentation

## 2024-04-01 HISTORY — PX: CHOLECYSTECTOMY: SHX55

## 2024-04-01 SURGERY — LAPAROSCOPIC CHOLECYSTECTOMY
Anesthesia: General

## 2024-04-01 MED ORDER — ACETAMINOPHEN 500 MG PO TABS
ORAL_TABLET | ORAL | Status: AC
  Filled 2024-04-01: qty 2

## 2024-04-01 MED ORDER — FENTANYL CITRATE (PF) 100 MCG/2ML IJ SOLN
INTRAMUSCULAR | Status: DC | PRN
  Administered 2024-04-01: 25 ug via INTRAVENOUS
  Administered 2024-04-01: 50 ug via INTRAVENOUS
  Administered 2024-04-01: 25 ug via INTRAVENOUS

## 2024-04-01 MED ORDER — FENTANYL CITRATE (PF) 100 MCG/2ML IJ SOLN
INTRAMUSCULAR | Status: AC
  Filled 2024-04-01: qty 2

## 2024-04-01 MED ORDER — CEFAZOLIN SODIUM-DEXTROSE 2-4 GM/100ML-% IV SOLN
INTRAVENOUS | Status: AC
  Filled 2024-04-01: qty 100

## 2024-04-01 MED ORDER — DEXAMETHASONE SODIUM PHOSPHATE 4 MG/ML IJ SOLN
INTRAMUSCULAR | Status: DC | PRN
  Administered 2024-04-01: 8 mg via INTRAVENOUS

## 2024-04-01 MED ORDER — ACETAMINOPHEN 500 MG PO TABS
1000.0000 mg | ORAL_TABLET | Freq: Once | ORAL | Status: AC
  Administered 2024-04-01: 1000 mg via ORAL

## 2024-04-01 MED ORDER — FENTANYL CITRATE (PF) 100 MCG/2ML IJ SOLN
25.0000 ug | INTRAMUSCULAR | Status: DC | PRN
  Administered 2024-04-01 (×2): 50 ug via INTRAVENOUS

## 2024-04-01 MED ORDER — LACTATED RINGERS IV SOLN: INTRAVENOUS | Status: DC

## 2024-04-01 MED ORDER — ONDANSETRON HCL 4 MG/2ML IJ SOLN
INTRAMUSCULAR | Status: DC | PRN
  Administered 2024-04-01: 4 mg via INTRAVENOUS

## 2024-04-01 MED ORDER — DROPERIDOL 2.5 MG/ML IJ SOLN: 0.6250 mg | Freq: Once | INTRAMUSCULAR | Status: DC | PRN

## 2024-04-01 MED ORDER — PROPOFOL 500 MG/50ML IV EMUL
INTRAVENOUS | Status: AC
  Filled 2024-04-01: qty 50

## 2024-04-01 MED ORDER — GABAPENTIN 300 MG PO CAPS
ORAL_CAPSULE | ORAL | Status: AC
  Filled 2024-04-01: qty 1

## 2024-04-01 MED ORDER — CEFAZOLIN SODIUM-DEXTROSE 2-4 GM/100ML-% IV SOLN
2.0000 g | INTRAVENOUS | Status: AC
  Administered 2024-04-01: 2 g via INTRAVENOUS

## 2024-04-01 MED ORDER — LIDOCAINE HCL (CARDIAC) PF 100 MG/5ML IV SOSY
PREFILLED_SYRINGE | INTRAVENOUS | Status: DC | PRN
  Administered 2024-04-01: 80 mg via INTRAVENOUS

## 2024-04-01 MED ORDER — OXYCODONE-ACETAMINOPHEN 5-325 MG PO TABS
1.0000 | ORAL_TABLET | ORAL | 0 refills | Status: AC | PRN
Start: 2024-04-01 — End: 2025-04-01

## 2024-04-01 MED ORDER — MIDAZOLAM HCL 2 MG/2ML IJ SOLN
INTRAMUSCULAR | Status: DC | PRN
  Administered 2024-04-01: 2 mg via INTRAVENOUS

## 2024-04-01 MED ORDER — METOCLOPRAMIDE HCL 5 MG/ML IJ SOLN
INTRAMUSCULAR | Status: AC
  Filled 2024-04-01: qty 2

## 2024-04-01 MED ORDER — ACETAMINOPHEN 500 MG PO TABS: 1000.0000 mg | ORAL_TABLET | ORAL | Status: AC

## 2024-04-01 MED ORDER — DEXMEDETOMIDINE HCL IN NACL 80 MCG/20ML IV SOLN
INTRAVENOUS | Status: AC
  Filled 2024-04-01: qty 20

## 2024-04-01 MED ORDER — 0.9 % SODIUM CHLORIDE (POUR BTL) OPTIME
TOPICAL | Status: DC | PRN
Start: 2024-04-01 — End: 2024-04-01
  Administered 2024-04-01: 120 mL

## 2024-04-01 MED ORDER — PROPOFOL 10 MG/ML IV BOLUS
INTRAVENOUS | Status: DC | PRN
  Administered 2024-04-01: 150 mg via INTRAVENOUS

## 2024-04-01 MED ORDER — OXYCODONE HCL 5 MG/5ML PO SOLN: 5.0000 mg | Freq: Once | ORAL | Status: DC | PRN

## 2024-04-01 MED ORDER — EPHEDRINE SULFATE (PRESSORS) 50 MG/ML IJ SOLN
INTRAMUSCULAR | Status: DC | PRN
  Administered 2024-04-01: 5 mg via INTRAVENOUS

## 2024-04-01 MED ORDER — OXYCODONE HCL 5 MG PO TABS: 5.0000 mg | ORAL_TABLET | Freq: Once | ORAL | Status: DC | PRN

## 2024-04-01 MED ORDER — PHENYLEPHRINE HCL (PRESSORS) 10 MG/ML IV SOLN
INTRAVENOUS | Status: DC | PRN
  Administered 2024-04-01: 80 ug via INTRAVENOUS

## 2024-04-01 MED ORDER — DEXMEDETOMIDINE HCL IN NACL 80 MCG/20ML IV SOLN
INTRAVENOUS | Status: DC | PRN
  Administered 2024-04-01: 4 ug via INTRAVENOUS

## 2024-04-01 MED ORDER — SUGAMMADEX SODIUM 200 MG/2ML IV SOLN
INTRAVENOUS | Status: DC | PRN
  Administered 2024-04-01: 200 mg via INTRAVENOUS

## 2024-04-01 MED ORDER — BUPIVACAINE-EPINEPHRINE (PF) 0.25% -1:200000 IJ SOLN
INTRAMUSCULAR | Status: DC | PRN
  Administered 2024-04-01: 30 mL via PERINEURAL

## 2024-04-01 MED ORDER — METOCLOPRAMIDE HCL 5 MG/ML IJ SOLN
10.0000 mg | Freq: Once | INTRAMUSCULAR | Status: AC
  Administered 2024-04-01: 10 mg via INTRAVENOUS

## 2024-04-01 MED ORDER — SODIUM CHLORIDE 0.9 % IR SOLN
Status: DC | PRN
  Administered 2024-04-01: 1000 mL

## 2024-04-01 MED ORDER — CHLORHEXIDINE GLUCONATE CLOTH 2 % EX PADS: 6.0000 | MEDICATED_PAD | Freq: Once | CUTANEOUS | Status: DC

## 2024-04-01 MED ORDER — ROCURONIUM BROMIDE 100 MG/10ML IV SOLN
INTRAVENOUS | Status: DC | PRN
  Administered 2024-04-01: 5 mg via INTRAVENOUS
  Administered 2024-04-01: 50 mg via INTRAVENOUS

## 2024-04-01 MED ORDER — GABAPENTIN 300 MG PO CAPS
300.0000 mg | ORAL_CAPSULE | Freq: Once | ORAL | Status: AC
  Administered 2024-04-01: 300 mg via ORAL

## 2024-04-01 MED ORDER — MIDAZOLAM HCL 2 MG/2ML IJ SOLN
INTRAMUSCULAR | Status: AC
Start: 2024-04-01 — End: 2024-04-01
  Filled 2024-04-01: qty 2

## 2024-04-01 SURGICAL SUPPLY — 33 items
BAG COUNTER SPONGE SURGICOUNT (BAG) ×2 IMPLANT
CHLORAPREP W/TINT 26 (MISCELLANEOUS) ×2 IMPLANT
CLIP APPLIE ROT 10 11.4 M/L (STAPLE) ×2 IMPLANT
DERMABOND ADVANCED .7 DNX12 (GAUZE/BANDAGES/DRESSINGS) ×2 IMPLANT
ELECTRODE REM PT RTRN 9FT ADLT (ELECTROSURGICAL) ×2 IMPLANT
ENDOLOOP SUT PDS II 0 18 (SUTURE) IMPLANT
GLOVE BIO SURGEON STRL SZ7.5 (GLOVE) ×2 IMPLANT
GLOVE BIOGEL PI IND STRL 8 (GLOVE) ×2 IMPLANT
GOWN STRL REUS W/ TWL LRG LVL3 (GOWN DISPOSABLE) ×4 IMPLANT
GOWN STRL REUS W/ TWL XL LVL3 (GOWN DISPOSABLE) ×2 IMPLANT
GRASPER SUT TROCAR 14GX15 (MISCELLANEOUS) ×2 IMPLANT
HEMOSTAT SNOW SURGICEL 2X4 (HEMOSTASIS) IMPLANT
IRRIGATION SUCT STRKRFLW 2 WTP (MISCELLANEOUS) ×2 IMPLANT
KIT TURNOVER KIT B (KITS) ×2 IMPLANT
NDL HYPO 22X1.5 SAFETY MO (MISCELLANEOUS) ×2 IMPLANT
NDL INSUFFLATION 14GA 120MM (NEEDLE) ×2 IMPLANT
NEEDLE HYPO 22X1.5 SAFETY MO (MISCELLANEOUS) ×1 IMPLANT
NEEDLE INSUFFLATION 14GA 120MM (NEEDLE) ×1 IMPLANT
NS IRRIG 1000ML POUR BTL (IV SOLUTION) ×2 IMPLANT
PACK BASIN DAY SURGERY FS (CUSTOM PROCEDURE TRAY) ×2 IMPLANT
POUCH RETRIEVAL ECOSAC 10 (ENDOMECHANICALS) ×2 IMPLANT
SCISSORS LAP 5X35 DISP (ENDOMECHANICALS) ×2 IMPLANT
SET TUBE SMOKE EVAC HIGH FLOW (TUBING) ×2 IMPLANT
SLEEVE SCD COMPRESS KNEE MED (STOCKING) ×2 IMPLANT
SLEEVE Z-THREAD 5X100MM (TROCAR) ×4 IMPLANT
SUT MNCRL AB 4-0 PS2 18 (SUTURE) ×2 IMPLANT
TOWEL GREEN STERILE FF (TOWEL DISPOSABLE) ×2 IMPLANT
TRAY LAPAROSCOPIC (CUSTOM PROCEDURE TRAY) ×2 IMPLANT
TROCAR 11X100 Z THREAD (TROCAR) ×2 IMPLANT
TROCAR Z-THREAD OPTICAL 5X100M (TROCAR) ×2 IMPLANT
TUBE CONNECTING 20X1/4 (TUBING) ×2 IMPLANT
WARMER LAPAROSCOPE (MISCELLANEOUS) IMPLANT
WATER STERILE IRR 1000ML POUR (IV SOLUTION) ×2 IMPLANT

## 2024-04-01 NOTE — Anesthesia Procedure Notes (Signed)
 Procedure Name: Intubation Date/Time: 04/01/2024 7:39 AM  Performed by: Donnell Berwyn SQUIBB, CRNAPre-anesthesia Checklist: Patient identified, Emergency Drugs available, Suction available, Patient being monitored and Timeout performed Patient Re-evaluated:Patient Re-evaluated prior to induction Oxygen Delivery Method: Circle system utilized Preoxygenation: Pre-oxygenation with 100% oxygen Induction Type: IV induction Ventilation: Mask ventilation without difficulty Laryngoscope Size: Mac and 3 Grade View: Grade I Tube type: Oral Tube size: 7.0 mm Number of attempts: 1 Airway Equipment and Method: Stylet Placement Confirmation: ETT inserted through vocal cords under direct vision, positive ETCO2 and breath sounds checked- equal and bilateral Secured at: 22 cm Tube secured with: Tape Dental Injury: Teeth and Oropharynx as per pre-operative assessment

## 2024-04-01 NOTE — Op Note (Signed)
 Patient: Samantha Terrell (1960/08/30, [id number])  Date of Surgery: 04/01/2024  Preoperative Diagnosis: CALCULUS OF GALLBLADDER WITH CHRONIC CHOLECYSTITIS   Postoperative Diagnosis: CALCULUS OF GALLBLADDER WITH CHRONIC CHOLECYSTITIS   Surgical Procedure: LAPAROSCOPIC CHOLECYSTECTOMY: 52437 (CPT)    Operative Team Members:  Surgeons and Role:    * Chancelor Hardrick, Deward PARAS, MD - Primary   Anesthesiologist: Darlyn Rush, MD CRNA: Donnell Berwyn SQUIBB, CRNA   Anesthesia: General   Fluids:  Total I/O In: 600 [I.V.:600] Out: 10 [Blood:10]  Complications: * No complications entered in OR log *  Drains:  none   Specimen:  ID Type Source Tests Collected by Time Destination  1 : Gallbladder with contents Tissue PATH Other SURGICAL PATHOLOGY Jerome Otter, Deward PARAS, MD 04/01/2024 0725      Disposition:  PACU - hemodynamically stable.  Plan of Care: Discharge to home after PACU    Indications for Procedure: Samantha Terrell is a 63 y.o. female who presented with abdominal pain.  History, physical and imaging was concerning for cholecystitis.  Laparoscopic cholecystectomy was recommended for the patient.  The procedure itself, as well as the risks, benefits and alternatives were discussed with the patient.  Risks discussed included but were not limited to the risk of infection, bleeding, damage to nearby structures, need to convert to open procedure, incisional hernia, bile leak, common bile duct injury and the need for additional procedures or surgeries.  With this discussion complete and all questions answered the patient granted consent to proceed.  Findings: inflamed gallbladder w/ gallstones  Infection status: Patient: Private Patient Elective Case Case: Elective Infection Present At Time Of Surgery (PATOS): Some spillage of bile   Description of Procedure:   On the date stated above, the patient was taken to the operating room suite and placed in supine positioning.   Sequential compression devices were placed on the lower extremities to prevent blood clots.  General endotracheal anesthesia was induced. Preoperative antibiotics were given.  The patient's abdomen was prepped and draped in the usual sterile fashion.  A time-out was completed verifying the correct patient, procedure, positioning and equipment needed for the case.  We began by anesthetizing the skin with local anesthetic and then making a 5 mm incision just below the umbilicus.  We dissected through the subcutaneous tissues to the fascia.  The fascia was grasped and elevated using a Kocher clamp.  A Veress needle was inserted into the abdomen and the abdomen was insufflated to 15 mmHg.  A 5 mm trocar was inserted in this position under optical guidance and then the abdomen was inspected.  There was no trauma to the underlying viscera with initial trocar placement.  Any abnormal findings, other than inflammation in the right upper quadrant, are listed above in the findings section.  Three additional trocars were placed, one 12 mm trocar in the subxiphoid position, one 5 mm trocar in the midline epigastric area and one 5mm trocar in the right upper quadrant subcostally.  These were placed under direct vision without any trauma to the underlying viscera.    The patient was then placed in head up, left side down positioning.  The gallbladder was identified and dissected free from its attachments to the omentum allowing the duodenum to fall away.  The infundibulum of the gallbladder was dissected free working laterally to medially.  The cystic duct and cystic artery were dissected free from surrounding connective tissue.  The infundibulum of the gallbladder was dissected off the cystic plate.  A critical view  of safety was obtained with the cystic duct and cystic artery being cleared of connective tissues and clearly the only two structures entering into the gallbladder with the liver clearly visible behind.  Clips  were then applied to the cystic duct and cystic artery and then these structures were divided.  A PDS endoloop was placed on the cystic duct stump. The gallbladder was dissected off the cystic plate, placed in an endocatch bag and removed from the 12 mm subxiphoid port site.  The clips were inspected and appeared effective.  A posterior branch of the cystic artery was clipped and divided.The cystic plate was inspected and hemostasis was obtained using electrocautery.  A suction irrigator was used to clean the operative field.  Snow topical hemostatic agent was placed on gallbladder fossa.  Attention was turned to closure.  The 12 mm subxiphoid port site was closed using a 0-vicryl suture on a fascial suture passer.  The abdomen was desufflated.  The skin was closed using 4-0 monocryl and dermabond.  All sponge and needle counts were correct at the conclusion of the case.    Deward Foy, MD General, Bariatric, & Minimally Invasive Surgery [facility], [place]

## 2024-04-01 NOTE — Transfer of Care (Signed)
 Immediate Anesthesia Transfer of Care Note  Patient: Samantha Terrell  Procedure(s) Performed: LAPAROSCOPIC CHOLECYSTECTOMY  Patient Location: PACU  Anesthesia Type:General  Level of Consciousness: awake, alert , oriented, and patient cooperative  Airway & Oxygen Therapy: Patient Spontanous Breathing and Patient connected to nasal cannula oxygen  Post-op Assessment: Report given to RN and Post -op Vital signs reviewed and stable  Post vital signs: Reviewed and stable  Last Vitals:  Vitals Value Taken Time  BP 145/73 04/01/24 08:48  Temp    Pulse 78 04/01/24 08:51  Resp 18 04/01/24 08:51  SpO2 97 % 04/01/24 08:51  Vitals shown include unfiled device data.  Last Pain:  Vitals:   04/01/24 0642  TempSrc: Temporal  PainSc: 0-No pain         Complications: No notable events documented.

## 2024-04-01 NOTE — H&P (Signed)
 Expand All Collapse All     Admitting Physician: Deward PARAS Melynda Krzywicki   Service: General surgery   CC: abdominal pain   Subjective    HPI: Samantha Terrell is an 63 y.o. female who is here for cholecystectomy       Past Medical History:  Diagnosis Date   Atrial fibrillation (HCC)     Degenerative disc disease, cervical     Depression     Dyspnea     Dysrhythmia     Family history of adverse reaction to anesthesia      mother-- ponv   GAD (generalized anxiety disorder)     History of adenomatous polyp of colon     History of COVID-19 11/2020    per pt mild symptoms that resolved   Hypertension     PVC (premature ventricular contraction)      symptomatic intermittant palpitations  (event monitor in epic 12-03-2014 showed SR w/ PVCs)   Sleep apnea     SUI (stress urinary incontinence, female)                 Past Surgical History:  Procedure Laterality Date   ATRIAL FIBRILLATION ABLATION N/A 09/05/2022    Procedure: ATRIAL FIBRILLATION ABLATION;  Surgeon: Nancey Eulas BRAVO, MD;  Location: [facility];  Service: Cardiovascular;  Laterality: N/A;   BLADDER SUSPENSION N/A 11/06/2021    Procedure: TRANSVAGINAL TAPE (TVT) PROCEDURE;  Surgeon: Sarrah Browning, MD;  Location: [facility];  Service: Gynecology;  Laterality: N/A;   COLONOSCOPY WITH PROPOFOL    06/07/2021    by Dr.Beavers   CYSTOCELE REPAIR N/A 11/06/2021    Procedure: ANTERIOR REPAIR (CYSTOCELE);  Surgeon: Sarrah Browning, MD;  Location: [facility];  Service: Gynecology;  Laterality: N/A;   CYSTOSCOPY N/A 11/06/2021    Procedure: CYSTOSCOPY;  Surgeon: Sarrah Browning, MD;  Location: [facility];  Service: Gynecology;  Laterality: N/A;   DRUG INDUCED ENDOSCOPY Bilateral 01/06/2023    Procedure: DRUG INDUCED SLEEP ENDOSCOPY;  Surgeon: Carlie Clark, MD;  Location: [facility] OR;  Service: ENT;  Laterality: Bilateral;   NASAL SEPTUM SURGERY   1986                Family History  Problem Relation Age of Onset   Arthritis Mother          bilateral knees   Hyperlipidemia Father     Cancer Maternal Grandmother          cervical cancer   Osteoporosis Maternal Grandmother     Heart attack Maternal Grandmother          age 88   Congestive Heart Failure Maternal Grandmother          age 69   Arthritis Maternal Grandfather          Severe, bilateral hands   Vascular Disease Maternal Grandfather          smoker, PAD   Osteoporosis Paternal Grandmother     Colon cancer Neg Hx     Breast cancer Neg Hx     Colon polyps Neg Hx     Esophageal cancer Neg Hx     Rectal cancer Neg Hx     Stomach cancer Neg Hx            Social:  reports that she has never smoked. She has never used smokeless tobacco. She reports current alcohol use of about 4.0 - 5.0 standard drinks of alcohol per week.  She reports that she does not use drugs.   Allergies:  Allergies      Allergies  Allergen Reactions   Latex Itching        Medications:     Current Outpatient Medications  Medication Instructions   apixaban  (ELIQUIS ) 5 mg, Oral, 2 times daily   ASHWAGANDHA PO Take by mouth.   losartan  (COZAAR ) 50 mg, Oral, Daily   magnesium (MAGTAB) 84 mg, Daily   metoprolol  succinate (TOPROL -XL) 25 mg, Oral, 2 times daily   norethindrone-ethinyl estradiol  (FYAVOLV) 1-5 MG-MCG TABS tablet 1 tablet, Daily   OVER THE COUNTER MEDICATION 5,000 mcg, 2 times daily   sertraline (ZOLOFT) 50 mg, Daily with lunch   Vitamin D  (Cholecalciferol) 1,000 Units, Daily at bedtime      ROS - all of the below systems have been reviewed with the patient and positives are indicated with bold text General: chills, fever or night sweats Eyes: blurry vision or double vision ENT: epistaxis or sore throat Allergy/Immunology: itchy/watery eyes or nasal congestion Hematologic/Lymphatic: bleeding problems, blood clots or swollen lymph nodes Endocrine: temperature intolerance or unexpected weight  changes Breast: new or changing breast lumps or nipple discharge Resp: cough, shortness of breath, or wheezing CV: chest pain or dyspnea on exertion GI: as per HPI GU: dysuria, trouble voiding, or hematuria MSK: joint pain or joint stiffness Neuro: TIA or stroke symptoms Derm: pruritus and skin lesion changes Psych: anxiety and depression   Objective    PE Vitals:   04/01/24 0642  BP: (!) 128/58  Pulse: 76  Temp: 98.5 F (36.9 C)  SpO2: 96%    Constitutional: NAD; conversant; no deformities Eyes: Moist conjunctiva; no lid lag; anicteric; PERRL Neck: Trachea midline; no thyromegaly Lungs: Normal respiratory effort; no tactile fremitus CV: RRR; no palpable thrills; no pitting edema GI: Abd soft, nontender; no palpable hepatosplenomegaly MSK: Normal range of motion of extremities; no clubbing/cyanosis Psychiatric: Appropriate affect; alert and oriented x3 Lymphatic: No palpable cervical or axillary lymphadenopathy   Lab Results Last 24 Hours  No results found for this or any previous visit (from the past 24 hours).     Imaging Orders  No imaging studies ordered today     RUQ US  12/21/23  IMPRESSION: Cholelithiasis with mild gallbladder wall thickening. No pericholecystic fluid and negative sonographic Murphy sign. Findings are equivocal for acute cholecystitis.    Assessment and Plan    Samantha Terrell is an 63 y.o. female with Cholelithiasis with cholecystitis  I recommended laparoscopic cholecystectomy. We discussed the procedure itself, its risks, benefit and alternatives. After a full discussion and all questions answered, the patient granted consent to proceed.

## 2024-04-01 NOTE — Discharge Instructions (Addendum)
 CHOLECYSTECTOMY POST OPERATIVE INSTRUCTIONS  Thinking Clearly  The anesthesia may cause you to feel different for 1 or 2 days. Do not drive, drink alcohol, or make any big decisions for at least 2 days.  Nutrition When you wake up, you will be able to drink small amounts of liquid. If you do not feel sick, you can slowly advance your diet to regular foods. Continue to drink lots of fluids, usually about 8 to 10 glasses per day. Eat a high-fiber diet so you don't strain during bowel movements. High-Fiber Foods Foods high in fiber include beans, bran cereals and whole-grain breads, peas, dried fruit (figs, apricots, and dates), raspberries, blackberries, strawberries, sweet corn, broccoli, baked potatoes with skin, plums, pears, apples, greens, and nuts. Activity Slowly increase your activity. Be sure to get up and walk every hour or so to prevent blood clots. No heavy lifting or strenuous activity for 4 weeks following surgery to prevent hernias at your incision sites It is normal to feel tired. You may need more sleep than usual.  Get your rest but make sure to get up and move around frequently to prevent blood clots and pneumonia.  Work and Return to [employer] can go back to work when you feel well enough. Discuss the timing with your surgeon. You can usually go back to school or work 1 week after an operation. If your work requires heavy lifting or strenuous activity you need to be placed on light duty for 4 weeks following surgery. You can return to gym class, sports or other physical activities 4 weeks after surgery.  Wound Care Always wash your hands before and after touching near your incision site. Do not soak in a bathtub until cleared at your follow up appointment. You may take a shower 24 hours after surgery. A small amount of drainage from the incision is normal. If the drainage is thick and yellow or the site is red, you may have an infection, so call your surgeon. If you  have a drain in one of your incisions, it will be taken out in office when the drainage stops. Steri-Strips will fall off in 7 to 10 days or they will be removed during your first office visit. If you have dermabond glue covering over the incision, allow the glue to flake off on its own. Avoid wearing tight or rough clothing. It may rub your incisions and make it harder for them to heal. Protect the new skin, especially from the sun. The sun can burn and cause darker scarring. Your scar will heal in about 4 to 6 weeks and will become softer and continue to fade over the next year.  The cosmetic appearance of the incisions will improve over the course of the first year after surgery. Sensation around your incision will return in a few weeks or months.  Bowel Movements After intestinal surgery, you may have loose watery stools for several days. If watery diarrhea lasts longer than 3 days, contact your surgeon. Pain medication (narcotics) can cause constipation. Increase the fiber in your diet with high-fiber foods if you are constipated. You can take an over the counter stool softener like Colace to avoid constipation.  Additional over the counter medications can also be used if Colace isn't sufficient (for example, Milk of Magnesia or Miralax).  Pain The amount of pain is different for each person. Some people need only 1 to 3 doses of pain control medication, while others need more. Take alternating doses of tylenol   and ibuprofen around the clock for the first five days following surgery.  This will provide a baseline of pain control and help with inflammation.  Take the narcotic pain medication in addition if needed for severe pain.  Contact Your Surgeon at [phone], if you have: Pain in your right upper abdomen like a gallbladder attack. Pain that will not go away Pain that gets worse A fever of more than 101F (38.3C) Repeated vomiting Swelling, redness, bleeding, or bad-smelling  drainage from your wound site Strong abdominal pain No bowel movement or unable to pass gas for 3 days Watery diarrhea lasting longer than 3 days  Pain Control The goal of pain control is to minimize pain, keep you moving and help you heal. Your surgical team will work with you on your pain plan. Most often a combination of therapies and medications are used to control your pain. You may also be given medication (local anesthetic) at the surgical site. This may help control your pain for several days. Extreme pain puts extra stress on your body at a time when your body needs to focus on healing. Do not wait until your pain has reached a level "10" or is unbearable before telling your doctor or nurse. It is much easier to control pain before it becomes severe. Following a laparoscopic procedure, pain is sometimes felt in the shoulder. This is due to the gas inserted into your abdomen during the procedure. Moving and walking helps to decrease the gas and the right shoulder pain.  Use the guide below for ways to manage your post-operative pain. Learn more by going to facs.org/safepaincontrol.  How Intense Is My Pain Common Therapies to Feel Better       I hardly notice my pain, and it does not interfere with my activities.  I notice my pain and it distracts me, but I can still do activities (sitting up, walking, standing).  Non-Medication Therapies  Ice (in a bag, applied over clothing at the surgical site), elevation, rest, meditation, massage, distraction (music, TV, play) walking and mild exercise Splinting the abdomen with pillows +  Non-Opioid Medications Acetaminophen  (Tylenol ) Non-steroidal anti-inflammatory drugs (NSAIDS) Aspirin, Ibuprofen (Motrin, Advil) Naproxen (Aleve) Take these as needed, when you feel pain. Both acetaminophen  and NSAIDs help to decrease pain and swelling (inflammation).      My pain is hard to ignore and is more noticeable even when I rest.  My  pain interferes with my usual activities.  Non-Medication Therapies  +  Non-Opioid medications  Take on a regular schedule (around-the-clock) instead of as needed. (For example, Tylenol  every 6 hours at 9:00 am, 3:00 pm, 9:00 pm, 3:00 am and Motrin every 6 hours at 12:00 am, 6:00 am, 12:00 pm, 6:00 pm)         I am focused on my pain, and I am not doing my daily activities.  I am groaning in pain, and I cannot sleep. I am unable to do anything.  My pain is as bad as it could be, and nothing else matters.  Non-Medication Therapies  +  Around-the-Clock Non-Opioid Medications  +  Short-acting opioids  Opioids should be used with other medications to manage severe pain. Opioids block pain and give a feeling of euphoria (feel high). Addiction, a serious side effect of opioids, is rare with short-term (a few days) use.  Examples of short-acting opioids include: Tramadol (Ultram), Hydrocodone  (Norco, Vicodin), Hydromorphone  (Dilaudid ), Oxycodone  (Oxycontin )     The above directions have been adapted from  the [employer] of [employer] Surgical Patient Education Program.  Please refer to the ACS website if needed: FreakyMates.de.ashx.   Deward Foy, MD [facility] [address], [place], [place]  [address] ?  [address] [address], [place], [place]   [address] [phone] ? [phone] ? FAX [phone] Web site: [email or web address]   May take tylenol  after 12:30p. May restart Eliquis  tomorrow.   Post Anesthesia Home Care Instructions  Activity: Get plenty of rest for the remainder of the day. A responsible individual must stay with you for 24 hours following the procedure.  For the next 24 hours, DO NOT: -Drive a car -Advertising copywriter -Drink alcoholic beverages -Take any medication unless instructed by your physician -Make any legal decisions or sign important  papers.  Meals: Start with liquid foods such as gelatin or soup. Progress to regular foods as tolerated. Avoid greasy, spicy, heavy foods. If nausea and/or vomiting occur, drink only clear liquids until the nausea and/or vomiting subsides. Call your physician if vomiting continues.  Special Instructions/Symptoms: Your throat may feel dry or sore from the anesthesia or the breathing tube placed in your throat during surgery. If this causes discomfort, gargle with warm salt water. The discomfort should disappear within 24 hours.  If you had a scopolamine patch placed behind your ear for the management of post- operative nausea and/or vomiting:  1. The medication in the patch is effective for 72 hours, after which it should be removed.  Wrap patch in a tissue and discard in the trash. Wash hands thoroughly with soap and water. 2. You may remove the patch earlier than 72 hours if you experience unpleasant side effects which may include dry mouth, dizziness or visual disturbances. 3. Avoid touching the patch. Wash your hands with soap and water after contact with the patch.

## 2024-04-04 ENCOUNTER — Encounter (HOSPITAL_BASED_OUTPATIENT_CLINIC_OR_DEPARTMENT_OTHER): Payer: Self-pay | Admitting: Surgery

## 2024-04-04 LAB — SURGICAL PATHOLOGY

## 2024-04-04 NOTE — Anesthesia Postprocedure Evaluation (Signed)
 Anesthesia Post Note  Patient: Samantha Terrell  Procedure(s) Performed: LAPAROSCOPIC CHOLECYSTECTOMY     Patient location during evaluation: PACU Anesthesia Type: General Level of consciousness: sedated and patient cooperative Pain management: pain level controlled Vital Signs Assessment: post-procedure vital signs reviewed and stable Respiratory status: spontaneous breathing Cardiovascular status: stable Anesthetic complications: no   No notable events documented.  Last Vitals:  Vitals:   04/01/24 0945 04/01/24 1024  BP: (!) 147/72 (!) 143/63  Pulse: 74 65  Resp: 17 16  Temp:  (!) 36.3 C  SpO2: 95% 95%    Last Pain:  Vitals:   04/01/24 1024  TempSrc: Temporal  PainSc: 4                  Norleen Pope
